# Patient Record
Sex: Female | Born: 1935 | Race: White | Hispanic: No | Smoking: Former smoker
Health system: Southern US, Community
[De-identification: ages and names within clinical notes are randomized; demographics above are authoritative.]

## PROBLEM LIST (undated history)

## (undated) DIAGNOSIS — F329 Major depressive disorder, single episode, unspecified: Secondary | ICD-10-CM

## (undated) DIAGNOSIS — F32A Depression, unspecified: Secondary | ICD-10-CM

## (undated) DIAGNOSIS — F419 Anxiety disorder, unspecified: Secondary | ICD-10-CM

## (undated) DIAGNOSIS — E7800 Pure hypercholesterolemia, unspecified: Secondary | ICD-10-CM

## (undated) DIAGNOSIS — M25569 Pain in unspecified knee: Secondary | ICD-10-CM

## (undated) DIAGNOSIS — C55 Malignant neoplasm of uterus, part unspecified: Secondary | ICD-10-CM

## (undated) DIAGNOSIS — I10 Essential (primary) hypertension: Secondary | ICD-10-CM

## (undated) HISTORY — PX: NOSE SURGERY: SHX723

## (undated) HISTORY — PX: BREAST SURGERY: SHX581

## (undated) HISTORY — PX: MIDDLE EAR SURGERY: SHX713

## (undated) HISTORY — PX: ABDOMINAL HYSTERECTOMY: SHX81

---

## 2003-12-03 ENCOUNTER — Ambulatory Visit: Payer: Self-pay | Admitting: General Surgery

## 2004-09-03 ENCOUNTER — Ambulatory Visit: Payer: Self-pay | Admitting: Internal Medicine

## 2004-12-04 ENCOUNTER — Ambulatory Visit: Payer: Self-pay | Admitting: General Surgery

## 2005-04-14 ENCOUNTER — Other Ambulatory Visit: Payer: Self-pay

## 2005-04-15 ENCOUNTER — Inpatient Hospital Stay: Payer: Self-pay | Admitting: Surgery

## 2005-05-21 ENCOUNTER — Ambulatory Visit: Payer: Self-pay | Admitting: Gastroenterology

## 2005-05-28 ENCOUNTER — Ambulatory Visit: Payer: Self-pay | Admitting: Unknown Physician Specialty

## 2005-07-01 ENCOUNTER — Ambulatory Visit: Payer: Self-pay | Admitting: Surgery

## 2005-07-03 ENCOUNTER — Ambulatory Visit: Payer: Self-pay | Admitting: Surgery

## 2005-09-03 ENCOUNTER — Emergency Department: Payer: Self-pay | Admitting: Internal Medicine

## 2005-11-30 ENCOUNTER — Emergency Department (HOSPITAL_COMMUNITY): Admission: EM | Admit: 2005-11-30 | Discharge: 2005-11-30 | Payer: Self-pay | Admitting: Family Medicine

## 2005-12-24 ENCOUNTER — Ambulatory Visit: Payer: Self-pay | Admitting: General Surgery

## 2007-01-04 ENCOUNTER — Ambulatory Visit: Payer: Self-pay | Admitting: General Surgery

## 2008-03-01 ENCOUNTER — Ambulatory Visit: Payer: Self-pay | Admitting: General Surgery

## 2011-09-30 DIAGNOSIS — I358 Other nonrheumatic aortic valve disorders: Secondary | ICD-10-CM | POA: Insufficient documentation

## 2011-09-30 DIAGNOSIS — I340 Nonrheumatic mitral (valve) insufficiency: Secondary | ICD-10-CM | POA: Insufficient documentation

## 2011-10-07 ENCOUNTER — Ambulatory Visit: Payer: Self-pay | Admitting: Ophthalmology

## 2011-10-22 DIAGNOSIS — F419 Anxiety disorder, unspecified: Secondary | ICD-10-CM | POA: Insufficient documentation

## 2011-10-28 ENCOUNTER — Ambulatory Visit: Payer: Self-pay | Admitting: Ophthalmology

## 2013-02-02 DIAGNOSIS — K52832 Lymphocytic colitis: Secondary | ICD-10-CM | POA: Insufficient documentation

## 2013-02-03 DIAGNOSIS — Z803 Family history of malignant neoplasm of breast: Secondary | ICD-10-CM | POA: Insufficient documentation

## 2013-02-03 DIAGNOSIS — Z8543 Personal history of malignant neoplasm of ovary: Secondary | ICD-10-CM | POA: Insufficient documentation

## 2013-09-07 ENCOUNTER — Ambulatory Visit: Payer: Self-pay | Admitting: Internal Medicine

## 2014-10-08 ENCOUNTER — Encounter: Payer: Self-pay | Admitting: Emergency Medicine

## 2014-10-08 ENCOUNTER — Ambulatory Visit
Admission: EM | Admit: 2014-10-08 | Discharge: 2014-10-08 | Disposition: A | Discharge status: Home / Self Care | Payer: Medicare Other | Attending: Family Medicine | Admitting: Family Medicine

## 2014-10-08 DIAGNOSIS — M25561 Pain in right knee: Secondary | ICD-10-CM

## 2014-10-08 HISTORY — DX: Malignant neoplasm of uterus, part unspecified: C55

## 2014-10-08 HISTORY — DX: Depression, unspecified: F32.A

## 2014-10-08 HISTORY — DX: Major depressive disorder, single episode, unspecified: F32.9

## 2014-10-08 HISTORY — DX: Pure hypercholesterolemia, unspecified: E78.00

## 2014-10-08 HISTORY — DX: Pain in unspecified knee: M25.569

## 2014-10-08 HISTORY — DX: Anxiety disorder, unspecified: F41.9

## 2014-10-08 NOTE — ED Provider Notes (Signed)
CSN: [id number]     Arrival date & time 10/08/14  1520 History   First MD Initiated Contact with Patient 10/08/14 1618     Chief Complaint  Patient presents with  . Knee Pain   (Consider location/radiation/quality/duration/timing/severity/associated sxs/prior Treatment) HPI  81 yo F from [facility], presents in a wheelchair complaining of right knee pain and requesting a cortisone injection.  It was explained that we did not have a provider who performed that particular procedure but we would be glad to  evaluate her knee and make recommendations, as well as treat her to help with her pain. She was very irritated that  She had been registered, "and they took my  $59" if we were unable to give her what she wanted.    Chelsea Hart was told  that front office does not confirm any procedures but rather advises potential patients that the medical personnel on duty will evaluate a given condition and diagnosis /therapy will be determined by the medical personnel. Chelsea. Hart was insistent that when she "made the appointment" she had made it clear what her requirements were for the visit. Reminded that we are a walk-in clinic without appointments,to which point she remembered that her conversation might have been with [facility]- They could not schedule her until August 19 which she felt was too long with her pain.( previous relationship with Dr Eliberto Ivory reported.) She again insisted that she be given a cortisone shot in the knee, and was reminded that that particular service was not available. She stated she had had right knee pain for a very long time and "other doctors had done it for her". She states that she drove into town recently and    that had  required 4 hours of sitting still and pushing the gas pedal. She had also helped her son paint  his entire living room which also stressed her knee. Past Medical History  Diagnosis Date  . Knee pain   . Anxiety   . Depression   . Hypercholesterolemia    . Uterine cancer    Past Surgical History  Procedure Laterality Date  . Abdominal hysterectomy    . Nose surgery    . Breast surgery    . Middle ear surgery     History reviewed. No pertinent family history. Social History  Substance Use Topics  . Smoking status: Current Every Day Smoker  . Smokeless tobacco: None  . Alcohol Use: No   OB History    No data available     Review of Systems Chelsea Hart complained of Right knee pain  Allergies  Penicillins  Home Medications   Prior to Admission medications   Medication Sig Start Date End Date Taking? Authorizing Provider  ALPRAZolam (XANAX XR) 0.5 MG 24 hr tablet Take 0.5 mg by mouth daily.   Yes Historical Provider, MD  amLODipine (NORVASC) 5 MG tablet Take 5 mg by mouth daily.   Yes Historical Provider, MD  PARoxetine HCl (PAXIL PO) Take by mouth.   Yes Historical Provider, MD  SIMVASTATIN PO Take by mouth.   Yes Historical Provider, MD   BP 123/61 mmHg  Pulse 92  Temp(Src) 98.9 F (37.2 C) (Oral)  Resp 18  SpO2 99% Physical Exam  I offered to evaluate her knee to see if there was anything we could do- She refused to be examined. She was alert and oriented, though irritated.  Seated in a wheelchair with right leg extended and partially covered under  a wrap Respirations were quiet , unlabored and without distress. Her VS were reported as normal Her right knee appeared larger than her left and without erythema.    ED Course  Procedures (including critical care time) Labs Review Labs Reviewed - No data to display  Imaging Review No results found.   Patient would not permit  an exam or evaluation so no medications could be issued to make her more comfortable She describes the pain as long standing, with increased episodes after stressing the joint. ( driving and painting) She apparently has medications at home for pain as well as wraps and crutches.  Dr. Zenda Alpers was apprised of the exam issues and consulted during  the course.  MDM   1. Right knee pain    Chelsea Hart son had dropped her off and left- so locating him and getting him back to UC required front office and nursing coordination of information. In that interim I contacted the local Urgent Cares and walk ins to see if anyone was Open and whether or not cortisone joint injections were performed.  The answers were universally NO except for the [place] office located at [address] and open until 7 pm. All the available information was gathered and reviewed with her son when he arrived, along with phone numbers and contact staff. It was explained that she was distressed about her registration and informed that  her $65.oo cash fee had been returned to her. Questions were fielded, recommendations made. Chelsea Hart and her son were welcomed to return for care in the future  Jan Fireman, [place] 10/10/14 2301

## 2014-10-08 NOTE — ED Notes (Signed)
Pt reports R knee pain since Tuesday when she drove home from beach. Has had fluid on R knee before.

## 2014-10-10 ENCOUNTER — Encounter: Payer: Self-pay | Admitting: Physician Assistant

## 2015-10-22 DIAGNOSIS — H04123 Dry eye syndrome of bilateral lacrimal glands: Secondary | ICD-10-CM | POA: Insufficient documentation

## 2015-10-30 DIAGNOSIS — R011 Cardiac murmur, unspecified: Secondary | ICD-10-CM | POA: Insufficient documentation

## 2015-10-30 DIAGNOSIS — R0602 Shortness of breath: Secondary | ICD-10-CM | POA: Insufficient documentation

## 2015-10-30 DIAGNOSIS — E7800 Pure hypercholesterolemia, unspecified: Secondary | ICD-10-CM | POA: Insufficient documentation

## 2015-10-30 DIAGNOSIS — I10 Essential (primary) hypertension: Secondary | ICD-10-CM | POA: Diagnosis present

## 2015-10-30 DIAGNOSIS — I422 Other hypertrophic cardiomyopathy: Secondary | ICD-10-CM | POA: Insufficient documentation

## 2015-12-12 DIAGNOSIS — J410 Simple chronic bronchitis: Secondary | ICD-10-CM | POA: Diagnosis present

## 2017-04-26 DIAGNOSIS — N183 Chronic kidney disease, stage 3 unspecified: Secondary | ICD-10-CM | POA: Insufficient documentation

## 2017-04-26 DIAGNOSIS — E042 Nontoxic multinodular goiter: Secondary | ICD-10-CM | POA: Insufficient documentation

## 2018-10-03 DIAGNOSIS — F17200 Nicotine dependence, unspecified, uncomplicated: Secondary | ICD-10-CM | POA: Diagnosis present

## 2019-06-06 DIAGNOSIS — Z1231 Encounter for screening mammogram for malignant neoplasm of breast: Secondary | ICD-10-CM | POA: Insufficient documentation

## 2019-06-06 DIAGNOSIS — Z79899 Other long term (current) drug therapy: Secondary | ICD-10-CM | POA: Insufficient documentation

## 2020-11-14 DIAGNOSIS — I2510 Atherosclerotic heart disease of native coronary artery without angina pectoris: Secondary | ICD-10-CM | POA: Diagnosis present

## 2021-03-27 ENCOUNTER — Emergency Department: Payer: Medicare Other

## 2021-03-27 ENCOUNTER — Encounter: Payer: Self-pay | Admitting: Internal Medicine

## 2021-03-27 ENCOUNTER — Other Ambulatory Visit: Payer: Self-pay

## 2021-03-27 ENCOUNTER — Inpatient Hospital Stay
Admission: EM | Admit: 2021-03-27 | Discharge: 2021-04-01 | DRG: 480 | Disposition: A | Discharge status: Skilled Nursing Facility | Payer: Medicare Other | Attending: Internal Medicine | Admitting: Internal Medicine

## 2021-03-27 DIAGNOSIS — Z79899 Other long term (current) drug therapy: Secondary | ICD-10-CM | POA: Diagnosis not present

## 2021-03-27 DIAGNOSIS — I82409 Acute embolism and thrombosis of unspecified deep veins of unspecified lower extremity: Secondary | ICD-10-CM

## 2021-03-27 DIAGNOSIS — Y92009 Unspecified place in unspecified non-institutional (private) residence as the place of occurrence of the external cause: Secondary | ICD-10-CM

## 2021-03-27 DIAGNOSIS — N183 Chronic kidney disease, stage 3 unspecified: Secondary | ICD-10-CM | POA: Diagnosis not present

## 2021-03-27 DIAGNOSIS — G459 Transient cerebral ischemic attack, unspecified: Secondary | ICD-10-CM | POA: Diagnosis not present

## 2021-03-27 DIAGNOSIS — Y9301 Activity, walking, marching and hiking: Secondary | ICD-10-CM | POA: Diagnosis present

## 2021-03-27 DIAGNOSIS — Z20822 Contact with and (suspected) exposure to covid-19: Secondary | ICD-10-CM | POA: Diagnosis present

## 2021-03-27 DIAGNOSIS — Z8542 Personal history of malignant neoplasm of other parts of uterus: Secondary | ICD-10-CM | POA: Diagnosis not present

## 2021-03-27 DIAGNOSIS — E7800 Pure hypercholesterolemia, unspecified: Secondary | ICD-10-CM | POA: Diagnosis present

## 2021-03-27 DIAGNOSIS — S80219A Abrasion, unspecified knee, initial encounter: Secondary | ICD-10-CM | POA: Diagnosis present

## 2021-03-27 DIAGNOSIS — I10 Essential (primary) hypertension: Secondary | ICD-10-CM | POA: Diagnosis present

## 2021-03-27 DIAGNOSIS — R29810 Facial weakness: Secondary | ICD-10-CM | POA: Diagnosis not present

## 2021-03-27 DIAGNOSIS — M1990 Unspecified osteoarthritis, unspecified site: Secondary | ICD-10-CM | POA: Diagnosis present

## 2021-03-27 DIAGNOSIS — I2699 Other pulmonary embolism without acute cor pulmonale: Secondary | ICD-10-CM

## 2021-03-27 DIAGNOSIS — S72002A Fracture of unspecified part of neck of left femur, initial encounter for closed fracture: Principal | ICD-10-CM | POA: Diagnosis present

## 2021-03-27 DIAGNOSIS — F32A Depression, unspecified: Secondary | ICD-10-CM | POA: Diagnosis present

## 2021-03-27 DIAGNOSIS — J410 Simple chronic bronchitis: Secondary | ICD-10-CM | POA: Diagnosis present

## 2021-03-27 DIAGNOSIS — I2693 Single subsegmental pulmonary embolism without acute cor pulmonale: Secondary | ICD-10-CM | POA: Diagnosis present

## 2021-03-27 DIAGNOSIS — F17210 Nicotine dependence, cigarettes, uncomplicated: Secondary | ICD-10-CM | POA: Diagnosis present

## 2021-03-27 DIAGNOSIS — Z880 Allergy status to penicillin: Secondary | ICD-10-CM | POA: Diagnosis not present

## 2021-03-27 DIAGNOSIS — N289 Disorder of kidney and ureter, unspecified: Secondary | ICD-10-CM

## 2021-03-27 DIAGNOSIS — Z419 Encounter for procedure for purposes other than remedying health state, unspecified: Secondary | ICD-10-CM

## 2021-03-27 DIAGNOSIS — Z90710 Acquired absence of both cervix and uterus: Secondary | ICD-10-CM

## 2021-03-27 DIAGNOSIS — M542 Cervicalgia: Secondary | ICD-10-CM | POA: Diagnosis present

## 2021-03-27 DIAGNOSIS — N179 Acute kidney failure, unspecified: Secondary | ICD-10-CM | POA: Diagnosis present

## 2021-03-27 DIAGNOSIS — F419 Anxiety disorder, unspecified: Secondary | ICD-10-CM | POA: Diagnosis present

## 2021-03-27 DIAGNOSIS — Z66 Do not resuscitate: Secondary | ICD-10-CM | POA: Diagnosis present

## 2021-03-27 DIAGNOSIS — I2583 Coronary atherosclerosis due to lipid rich plaque: Secondary | ICD-10-CM | POA: Diagnosis present

## 2021-03-27 DIAGNOSIS — G8929 Other chronic pain: Secondary | ICD-10-CM | POA: Diagnosis present

## 2021-03-27 DIAGNOSIS — N1831 Chronic kidney disease, stage 3a: Secondary | ICD-10-CM | POA: Diagnosis not present

## 2021-03-27 DIAGNOSIS — I671 Cerebral aneurysm, nonruptured: Secondary | ICD-10-CM | POA: Diagnosis present

## 2021-03-27 DIAGNOSIS — W010XXA Fall on same level from slipping, tripping and stumbling without subsequent striking against object, initial encounter: Secondary | ICD-10-CM | POA: Diagnosis present

## 2021-03-27 DIAGNOSIS — I2510 Atherosclerotic heart disease of native coronary artery without angina pectoris: Secondary | ICD-10-CM | POA: Diagnosis present

## 2021-03-27 DIAGNOSIS — M25552 Pain in left hip: Secondary | ICD-10-CM | POA: Diagnosis present

## 2021-03-27 DIAGNOSIS — F17200 Nicotine dependence, unspecified, uncomplicated: Secondary | ICD-10-CM | POA: Diagnosis present

## 2021-03-27 LAB — BASIC METABOLIC PANEL
Anion gap: 9 (ref 5–15)
BUN: 17 mg/dL (ref 8–23)
CO2: 25 mmol/L (ref 22–32)
Calcium: 10.1 mg/dL (ref 8.9–10.3)
Chloride: 103 mmol/L (ref 98–111)
Creatinine, Ser: 1.13 mg/dL — ABNORMAL HIGH (ref 0.44–1.00)
GFR, Estimated: 48 mL/min — ABNORMAL LOW (ref >60)
Glucose, Bld: 108 mg/dL — ABNORMAL HIGH (ref 70–99)
Potassium: 3.6 mmol/L (ref 3.5–5.1)
Sodium: 137 mmol/L (ref 135–145)

## 2021-03-27 LAB — CBC
HCT: 34.4 % — ABNORMAL LOW (ref 36.0–46.0)
Hemoglobin: 11.5 g/dL — ABNORMAL LOW (ref 12.0–15.0)
MCH: 31.8 pg (ref 26.0–34.0)
MCHC: 33.4 g/dL (ref 30.0–36.0)
MCV: 95 fL (ref 80.0–100.0)
Platelets: 278 10*3/uL (ref 150–400)
RBC: 3.62 MIL/uL — ABNORMAL LOW (ref 3.87–5.11)
RDW: 13.2 % (ref 11.5–15.5)
WBC: 9.6 10*3/uL (ref 4.0–10.5)
nRBC: 0 % (ref 0.0–0.2)

## 2021-03-27 LAB — RESP PANEL BY RT-PCR (FLU A&B, COVID) ARPGX2
Influenza A by PCR: NEGATIVE
Influenza B by PCR: NEGATIVE
SARS Coronavirus 2 by RT PCR: NEGATIVE

## 2021-03-27 LAB — TROPONIN I (HIGH SENSITIVITY)
Troponin I (High Sensitivity): 44 ng/L — ABNORMAL HIGH (ref <18)
Troponin I (High Sensitivity): 39 ng/L — ABNORMAL HIGH (ref <18)

## 2021-03-27 LAB — CK: Total CK: 106 U/L (ref 38–234)

## 2021-03-27 MED ORDER — FENTANYL CITRATE PF 50 MCG/ML IJ SOSY
50.0000 ug | PREFILLED_SYRINGE | Freq: Once | INTRAMUSCULAR | Status: AC
  Administered 2021-03-27: 50 ug via INTRAVENOUS

## 2021-03-27 MED ORDER — DOCUSATE SODIUM 100 MG PO CAPS
100.0000 mg | ORAL_CAPSULE | Freq: Two times a day (BID) | ORAL | Status: DC
  Administered 2021-03-28 – 2021-04-01 (×8): 100 mg via ORAL
  Filled 2021-03-27 (×9): qty 1

## 2021-03-27 MED ORDER — MORPHINE SULFATE (PF) 2 MG/ML IV SOLN
0.5000 mg | INTRAVENOUS | Status: DC | PRN
  Administered 2021-03-28: 0.5 mg via INTRAVENOUS
  Filled 2021-03-27: qty 1

## 2021-03-27 MED ORDER — LABETALOL HCL 5 MG/ML IV SOLN
10.0000 mg | Freq: Once | INTRAVENOUS | Status: AC
  Administered 2021-03-27: 10 mg via INTRAVENOUS
  Filled 2021-03-27: qty 4

## 2021-03-27 MED ORDER — FENTANYL CITRATE PF 50 MCG/ML IJ SOSY
50.0000 ug | PREFILLED_SYRINGE | INTRAMUSCULAR | Status: DC | PRN
  Administered 2021-03-28: 50 ug via INTRAVENOUS
  Filled 2021-03-27 (×2): qty 1

## 2021-03-27 MED ORDER — CEFAZOLIN SODIUM-DEXTROSE 1-4 GM/50ML-% IV SOLN
1.0000 g | Freq: Once | INTRAVENOUS | Status: DC
  Filled 2021-03-27: qty 50

## 2021-03-27 MED ORDER — PAROXETINE HCL 20 MG PO TABS
40.0000 mg | ORAL_TABLET | Freq: Every day | ORAL | Status: DC
  Administered 2021-03-29 – 2021-04-01 (×4): 40 mg via ORAL
  Filled 2021-03-27 (×5): qty 2

## 2021-03-27 MED ORDER — METOPROLOL TARTRATE 25 MG PO TABS
25.0000 mg | ORAL_TABLET | Freq: Two times a day (BID) | ORAL | Status: DC
  Administered 2021-03-27 – 2021-04-01 (×10): 25 mg via ORAL
  Filled 2021-03-27 (×11): qty 1

## 2021-03-27 MED ORDER — LATANOPROST 0.005 % OP SOLN
1.0000 [drp] | Freq: Every day | OPHTHALMIC | Status: DC
  Administered 2021-03-27 – 2021-03-31 (×5): 1 [drp] via OPHTHALMIC
  Filled 2021-03-27: qty 2.5

## 2021-03-27 MED ORDER — HYDROCODONE-ACETAMINOPHEN 5-325 MG PO TABS
1.0000 | ORAL_TABLET | Freq: Four times a day (QID) | ORAL | Status: DC | PRN
  Administered 2021-03-27: 2 via ORAL
  Administered 2021-03-28: 1 via ORAL
  Administered 2021-03-29 (×2): 2 via ORAL
  Administered 2021-03-29 – 2021-03-31 (×4): 1 via ORAL
  Administered 2021-04-01: 2 via ORAL
  Filled 2021-03-27: qty 1
  Filled 2021-03-27: qty 2
  Filled 2021-03-27 (×2): qty 1
  Filled 2021-03-27 (×4): qty 2
  Filled 2021-03-27 (×2): qty 1

## 2021-03-27 MED ORDER — METHOCARBAMOL 500 MG PO TABS
500.0000 mg | ORAL_TABLET | Freq: Four times a day (QID) | ORAL | Status: DC | PRN
  Administered 2021-03-28 – 2021-03-31 (×6): 500 mg via ORAL
  Filled 2021-03-27 (×6): qty 1

## 2021-03-27 MED ORDER — FENOFIBRATE 54 MG PO TABS
54.0000 mg | ORAL_TABLET | Freq: Every day | ORAL | Status: DC
  Administered 2021-03-29 – 2021-04-01 (×4): 54 mg via ORAL
  Filled 2021-03-27 (×5): qty 1

## 2021-03-27 MED ORDER — ONDANSETRON HCL 4 MG/2ML IJ SOLN
4.0000 mg | Freq: Once | INTRAMUSCULAR | Status: AC
  Administered 2021-03-27: 4 mg via INTRAVENOUS
  Filled 2021-03-27: qty 2

## 2021-03-27 MED ORDER — SIMVASTATIN 20 MG PO TABS
20.0000 mg | ORAL_TABLET | Freq: Every day | ORAL | Status: DC
  Administered 2021-03-27 – 2021-03-31 (×5): 20 mg via ORAL
  Filled 2021-03-27 (×5): qty 1

## 2021-03-27 MED ORDER — METHOCARBAMOL 1000 MG/10ML IJ SOLN
500.0000 mg | Freq: Four times a day (QID) | INTRAVENOUS | Status: DC | PRN
  Filled 2021-03-27: qty 5

## 2021-03-27 MED ORDER — ASPIRIN EC 81 MG PO TBEC
81.0000 mg | DELAYED_RELEASE_TABLET | Freq: Two times a day (BID) | ORAL | Status: DC
  Administered 2021-03-27 – 2021-04-01 (×9): 81 mg via ORAL
  Filled 2021-03-27 (×10): qty 1

## 2021-03-27 NOTE — Assessment & Plan Note (Signed)
Admit to MedSurg.  N.p.o. after midnight.  Orthopedics has been consulted.  As needed oxycodone for pain.  As needed IV Dilaudid for breakthrough pain.

## 2021-03-27 NOTE — ED Notes (Signed)
Patient transported to CT 

## 2021-03-27 NOTE — ED Notes (Signed)
Returned Call so son Percell Miller and updated on patient status and plan of care. Son stated that patient fell in the bathroom. Pt was able to be moved from floor to her bed.

## 2021-03-27 NOTE — Assessment & Plan Note (Signed)
Stable.  Patient on Lopressor 25 mg twice daily.

## 2021-03-27 NOTE — Assessment & Plan Note (Addendum)
Chronic.  Baseline creatinine is

## 2021-03-27 NOTE — Assessment & Plan Note (Signed)
Verified DNR status with the patient.  She states that her daughter Chelsea Hart is aware of her desire to be a DNR.

## 2021-03-27 NOTE — ED Notes (Signed)
Informed RN bed assigned 

## 2021-03-27 NOTE — ED Triage Notes (Signed)
Pt here via [facility] with a fall and weakness x3 months. Pt fell last night, skin tear to right knee. Pt denies pain. Pt in NAD in triage.   180/88 70 97% RA  109-cbg

## 2021-03-27 NOTE — ACP (Advance Care Planning) (Signed)
°  Advance Care Planning  Reason for Advance Care Planning Conversation: Acute hospitalization Principal Problem:   Closed left hip fracture (HCC) Active Problems:   CKD (chronic kidney disease) stage 3, GFR 30-59 ml/min (HCC)   Essential hypertension   Coronary artery disease due to lipid rich plaque   Simple chronic bronchitis ([place])   Smoker   DNR (do not resuscitate)/DNI(Do Not Intubate)    I discussed with patient about advance care planning. Specifically, we discussed whether patient would desire cardiopulmonary resuscitation (CPR) in the event of acute cardiopulmonary arrest. We also discussed whether endotracheal intubation and temporary ventilator life support would be desired in the event of acute cardio- or pulmonary decompensation.   Code status order of DNR has been entered in accord with the patient's wishes. no intubation  Living will: no  Health Care Agent / Davonna Belling              Name: hugh Loe: Relationship to Patient: son   Is agent appointed in legal document no  Time spent today in ACP discussion was  5 mins  Kristopher Oppenheim, DO [facility]

## 2021-03-27 NOTE — ED Provider Notes (Signed)
[facility] Provider Note    Event Date/Time   First MD Initiated Contact with Patient 03/27/21 1514     (approximate)   History   Weakness and Fall   HPI  Chelsea Hart is a 88 y.o. female with a history of anxiety and depression presents to the ER for evaluation evaluation of left hip and knee pain after fall yesterday morning.  She denies taking any blood thinners.  States that she fell like her left leg gave out with weakness.  Denies any chest pain or shortness of breath.  No nausea or vomiting.  No headache.  Does have chronic neck pain.     Physical Exam   Triage Vital Signs: ED Triage Vitals  Enc Vitals Group     BP 03/27/21 1505 (!) 190/87     Pulse Rate 03/27/21 1505 72     Resp 03/27/21 1505 16     Temp 03/27/21 1505 98.6 F (37 C)     Temp Source 03/27/21 1505 Oral     SpO2 03/27/21 1505 97 %     Weight 03/27/21 1508 155 lb (70.3 kg)     Height 03/27/21 1508 5\' 3"  (1.6 m)     Head Circumference --      Peak Flow --      Pain Score 03/27/21 1507 8     Pain Loc --      Pain Edu? --      Excl. in [place]? --     Most recent vital signs: Vitals:   03/27/21 1505 03/27/21 1530  BP: (!) 190/87 (!) 180/76  Pulse: 72 62  Resp: 16 18  Temp: 98.6 F (37 C)   SpO2: 97% 97%     Constitutional: Alert  Eyes: Conjunctivae are normal.  Head: Atraumatic. Nose: No congestion/rhinnorhea. Mouth/Throat: Mucous membranes are moist.   Neck: Painless ROM.  Cardiovascular:   Good peripheral circulation. Respiratory: Normal respiratory effort.  No retractions.  Gastrointestinal: Soft and nontender.  Musculoskeletal: Pain with logroll of left hip and with palpation of the left knee. No clear deformity Neurologic:  MAE spontaneously. No gross focal neurologic deficits are appreciated.  Skin:  Skin is warm, dry and intact. No rash noted. Psychiatric: Mood and affect are normal. Speech and behavior are normal.    ED Results / Procedures /  Treatments   Labs (all labs ordered are listed, but only abnormal results are displayed) Labs Reviewed  BASIC METABOLIC PANEL - Abnormal; Notable for the following components:      Result Value   Glucose, Bld 108 (*)    Creatinine, Ser 1.13 (*)    GFR, Estimated 48 (*)    All other components within normal limits  CBC - Abnormal; Notable for the following components:   RBC 3.62 (*)    Hemoglobin 11.5 (*)    HCT 34.4 (*)    All other components within normal limits  TROPONIN I (HIGH SENSITIVITY) - Abnormal; Notable for the following components:   Troponin I (High Sensitivity) 44 (*)    All other components within normal limits  CK  URINALYSIS, ROUTINE W REFLEX MICROSCOPIC  CBG MONITORING, ED  TROPONIN I (HIGH SENSITIVITY)     EKG  ED ECG REPORT I, Merlyn Lot, the attending physician, personally viewed and interpreted this ECG.   Date: 03/27/2021  EKG Time: 15:08  Rate: 65  Rhythm: sinus  Axis: normal  Intervals:normal qt  ST&T Change: poor r wave progression, no stemi  RADIOLOGY Please see ED Course for my review and interpretation.  I personally reviewed all radiographic images ordered to evaluate for the above acute complaints and reviewed radiology reports and findings.  These findings were personally discussed with the patient.  Please see medical record for radiology report.    PROCEDURES:  Critical Care performed:   Procedures   MEDICATIONS ORDERED IN ED: Medications  fentaNYL (SUBLIMAZE) injection 50 mcg (has no administration in time range)  ceFAZolin (ANCEF) IVPB 1 g/50 mL premix (has no administration in time range)     IMPRESSION / MDM / ASSESSMENT AND PLAN / ED COURSE  I reviewed the triage vital signs and the nursing notes.                              Differential diagnosis includes, but is not limited to, fracture, contusion, dislocation, electrolyte abnormality, anemia, rhabdo, ACS, dysrhythmia, subdural, IPH,  concussion  Patient presenting with symptoms as described above with left leg and hip pain as described above.  X-rays ordered for the but differential will provide IV pain medication.  EKG does not show any evidence of dysrhythmia or preexcitation syndrome and she is denying any chest pain at this time her abdominal exam is soft benign.  Blood will be sent for the blood differential.   Clinical Course as of 03/27/21 1711  Thu Mar 27, 2021  1623 Chest x-ray per my review does not show any evidence of pneumothorax. [PR]  2542 CT head by my review does not show any evidence of large hemorrhage or subdural hematoma.[place] of left hip by my review does not show dislocation per radiology report does show evidence of impacted femoral neck fracture.  Patient will require hospitalization.  I have ordered IV fentanyl for pain control. [PR]  7062 Patient's troponin is elevated but she is denying any chest pain at this time only complaining of left hip pain.  I will consult with Dr. Rudene Christians of orthopedics. [PR]  3762   Patient will require admission.  Discussed with hospitalist who have accepted to their service. [PR]    Clinical Course User Index [PR] Merlyn Lot, MD     FINAL CLINICAL IMPRESSION(S) / ED DIAGNOSES   Final diagnoses:  Closed fracture of neck of left femur, initial encounter [facility])     Rx / DC Orders   ED Discharge Orders     None        Note:  This document was prepared using Dragon voice recognition software and may include unintentional dictation errors.    Merlyn Lot, MD 03/27/21 [id number]

## 2021-03-27 NOTE — Consult Note (Signed)
Reason for Consult: Left hip pain fracture Referring Physician: Dr. Casey Burkitt is an 88 y.o. female.  HPI: Patient is an 88 year old who lives in [place] was drove up to visit her children.  She says when she got her car she suffered a fall and has an abrasion to the anterior knee and bruise to the lateral hip.  She has been having persisting pain and was brought to emergency room where she is found to have an impacted femoral neck fracture.  She is being admitted for treatment of this.  She reports she takes care of her self living alone able to drive and walks without assistive device  Past Medical History:  Diagnosis Date   Anxiety    Depression    Hypercholesterolemia    Knee pain    Uterine cancer ([place])     Past Surgical History:  Procedure Laterality Date   ABDOMINAL HYSTERECTOMY     BREAST SURGERY     MIDDLE EAR SURGERY     NOSE SURGERY      No family history on file.  Social History:  reports that she has been smoking. She does not have any smokeless tobacco history on file. She reports that she does not drink alcohol. No history on file for drug use.  Allergies:  Allergies  Allergen Reactions   Penicillins     Medications: I have reviewed the patient's current medications.  Results for orders placed or performed during the hospital encounter of 03/27/21 (from the past 48 hour(s))  Basic metabolic panel     Status: Abnormal   Collection Time: 03/27/21  3:10 PM  Result Value Ref Range   Sodium 137 135 - 145 mmol/L   Potassium 3.6 3.5 - 5.1 mmol/L   Chloride 103 98 - 111 mmol/L   CO2 25 22 - 32 mmol/L   Glucose, Bld 108 (H) 70 - 99 mg/dL    Comment: Glucose reference range applies only to samples taken after fasting for at least 8 hours.   BUN 17 8 - 23 mg/dL   Creatinine, Ser 1.13 (H) 0.44 - 1.00 mg/dL   Calcium 10.1 8.9 - 10.3 mg/dL   GFR, Estimated 48 (L) >60 mL/min    Comment: (NOTE) Calculated using the CKD-EPI Creatinine Equation  (2021)    Anion gap 9 5 - 15    Comment: Performed at [facility], [address] [place], [place] [address]  CBC     Status: Abnormal   Collection Time: 03/27/21  3:10 PM  Result Value Ref Range   WBC 9.6 4.0 - 10.5 K/uL   RBC 3.62 (L) 3.87 - 5.11 MIL/uL   Hemoglobin 11.5 (L) 12.0 - 15.0 g/dL   HCT 34.4 (L) 36.0 - 46.0 %   MCV 95.0 80.0 - 100.0 fL   MCH 31.8 26.0 - 34.0 pg   MCHC 33.4 30.0 - 36.0 g/dL   RDW 13.2 11.5 - 15.5 %   Platelets 278 150 - 400 K/uL   nRBC 0.0 0.0 - 0.2 %    Comment: Performed at [facility], [place],  [address]  Troponin I (High Sensitivity)     Status: Abnormal   Collection Time: 03/27/21  3:10 PM  Result Value Ref Range   Troponin I (High Sensitivity) 44 (H) <18 ng/L    Comment: (NOTE) Elevated high sensitivity troponin I (hsTnI) values and significant  changes across serial measurements may suggest ACS but many other  chronic  and acute conditions are known to elevate hsTnI results.  Refer to the "Links" section for chest pain algorithms and additional  guidance. Performed at [facility], [address] [place], [place] [address]   CK     Status: None   Collection Time: 03/27/21  3:10 PM  Result Value Ref Range   Total CK 106 38 - 234 U/L    Comment: Performed at [facility], [address] [place], [place] [address]    DG Chest 1 View  Result Date: 03/27/2021 CLINICAL DATA:  Weakness and fall.  Question infiltrate. EXAM: CHEST  1 VIEW COMPARISON:  04/14/2005 FINDINGS: Artifact overlies the chest. Heart size is normal. Mild aortic atherosclerotic tortuosity and calcification. The right chest is clear and normal. There is mild elevation of the left hemidiaphragm, with mild crowding of the markings at the lung base because of that, but the lung appears otherwise clear. No effusion. IMPRESSION: No acute or traumatic finding. Mild elevation of the left hemidiaphragm with mild crowding  of the markings at the left base because of that. Electronically Signed   By: Nelson Chimes M.D.   On: 03/27/2021 16:13   CT HEAD WO CONTRAST (5MM)  Result Date: 03/27/2021 CLINICAL DATA:  Head trauma, minor (Age >= 65y) EXAM: CT HEAD WITHOUT CONTRAST TECHNIQUE: Contiguous axial images were obtained from the base of the skull through the vertex without intravenous contrast. RADIATION DOSE REDUCTION: This exam was performed according to the departmental dose-optimization program which includes automated exposure control, adjustment of the mA and/or kV according to patient size and/or use of iterative reconstruction technique. COMPARISON:  None. FINDINGS: Brain: No evidence of acute infarction, hemorrhage, hydrocephalus, extra-axial collection or mass lesion/mass effect. Focal area of encephalomalacia in the right frontal lobe compatible with remote ischemia. Multiple prior lacunar infarcts in the bilateral basal ganglia. Extensive low-density changes within the periventricular and subcortical white matter compatible with chronic microvascular ischemic change. Mild diffuse cerebral volume loss. Vascular: Atherosclerotic calcifications involving the large vessels of the skull base. No unexpected hyperdense vessel. Skull: Normal. Negative for fracture or focal lesion. Sinuses/Orbits: No acute finding. Other: None. IMPRESSION: 1. No acute intracranial abnormality. 2. Extensive chronic microvascular ischemic changes and cerebral volume loss. Electronically Signed   By: Davina Poke D.O.   On: 03/27/2021 16:40   CT Cervical Spine Wo Contrast  Result Date: 03/27/2021 CLINICAL DATA:  Neck trauma EXAM: CT CERVICAL SPINE WITHOUT CONTRAST TECHNIQUE: Multidetector CT imaging of the cervical spine was performed without intravenous contrast. Multiplanar CT image reconstructions were also generated. RADIATION DOSE REDUCTION: This exam was performed according to the departmental dose-optimization program which includes  automated exposure control, adjustment of the mA and/or kV according to patient size and/or use of iterative reconstruction technique. COMPARISON:  None. FINDINGS: Alignment: Grade 1 anterolisthesis of C3 on C4 and C7 on T1. No evidence of acute subluxation. Skull base and vertebrae: No acute fracture. No primary bone lesion or focal pathologic process. Soft tissues and spinal canal: No prevertebral fluid or swelling. No visible canal hematoma. Disc levels: Moderate to severe intervertebral disc space narrowing from C4 through C7. Associated endplate sclerosis and dorsal endplate osteophytes most prominent at C5-C6. Bilateral facet arthropathy. Mild neural foraminal narrowing at C5-C6 on the right. Upper chest: Heterogeneous thyroid gland with likely nodules on the right measuring up to 1.5 cm. Mild biapical pleural thickening. Other: None. IMPRESSION: 1. No acute fracture or subluxation identified. Degenerative changes of the cervical spine. 2. Heterogeneous nodular  thyroid gland, consider follow-up ultrasound. Electronically Signed   By: Ofilia Neas M.D.   On: 03/27/2021 16:37   DG Knee Complete 4 Views Left  Result Date: 03/27/2021 CLINICAL DATA:  [place] yesterday.  Knee pain. EXAM: LEFT KNEE - COMPLETE 4+ VIEW COMPARISON:  None. FINDINGS: No joint effusion. No fracture. Chronic appearing joint space narrowing with chondrocalcinosis, typical of age. No focal lesion. IMPRESSION: No acute or traumatic finding. Joint space narrowing and chondrocalcinosis. Electronically Signed   By: Nelson Chimes M.D.   On: 03/27/2021 16:11   DG Hip Unilat W or Wo Pelvis 2-3 Views Left  Result Date: 03/27/2021 CLINICAL DATA:  [place] yesterday with left hip pain. EXAM: DG HIP (WITH OR WITHOUT PELVIS) 2-3V LEFT COMPARISON:  None. FINDINGS: There is a fracture of the left femoral neck with slight impaction. No intertrochanteric component. The bones of the pelvis are negative. IMPRESSION: Left femoral neck fracture with minimal  impaction. Electronically Signed   By: Nelson Chimes M.D.   On: 03/27/2021 16:10    Review of Systems Blood pressure (!) 180/76, pulse 62, temperature 98.6 F (37 C), temperature source Oral, resp. rate 18, height 5\' 3"  (1.6 m), weight 70.3 kg, SpO2 97 %. Physical Exam There is approximately 2 and half centimeter abrasion over the anterior knee and approximately same size bruise to the lateral aspect of the hip.  There is no pain with logrolling and there is no shortening or external rotation of the leg.  She does have palpable pulses Assessment/Plan: Impacted femoral neck fracture an active 88 year old Recommendation is for hip pinning and weightbearing as tolerated following this.  Risk of avascular necrosis and nonunion were discussed as well as blood clot.  She has no history of DVT PE  Hessie Knows 03/27/2021, 5:10 PM

## 2021-03-27 NOTE — Assessment & Plan Note (Signed)
Patient still smoking

## 2021-03-27 NOTE — ED Notes (Signed)
Pt stated that she fell last night on left side while getting in her car. Pt denies taking any blood thinners. C/o of pain when moving left leg of 9 on a 0-10 pain scale but when extremity is still pt doesn't not c/o of pain.

## 2021-03-27 NOTE — Assessment & Plan Note (Signed)
Chronic. 

## 2021-03-27 NOTE — Subjective & Objective (Signed)
CC: fall, left hip pain HPI: 88 year old female who is from [facility] who is a here visiting her son.  She also dropped off a friend in [place].  Patient still driving.  She lives independently.  She states that she fell at her son's house yesterday morning.  She was able to get back up.  She states that her left leg gave out from her when she was walking.  She has been able to walk around on it yesterday.  When she try to get up this morning she fell again.  Her son urged her to come to the ER.  ER evaluation demonstrated a left femoral neck fracture with slight impaction.  Orthopedics has been consulted.  Patient to go to the OR tomorrow.  [facility] contacted for admission.  Patient complains of some neck pain.  CT scan of the neck was negative for any fractures.

## 2021-03-27 NOTE — H&P (Signed)
History and Physical    Chelsea Hart KDT:[id number] DOB: January 21, 1934 DOA: 03/27/2021  PCP: Pcp, No   Patient coming from: Home  I have personally briefly reviewed patient's old medical records in [place]  CC: fall, left hip pain HPI: 88 year old female who is from [facility] who is a here visiting her son.  She also dropped off a friend in [place].  Patient still driving.  She lives independently.  She states that she fell at her son's house yesterday morning.  She was able to get back up.  She states that her left leg gave out from her when she was walking.  She has been able to walk around on it yesterday.  When she try to get up this morning she fell again.  Her son urged her to come to the ER.  ER evaluation demonstrated a left femoral neck fracture with slight impaction.  Orthopedics has been consulted.  Patient to go to the OR tomorrow.  [facility] hospitalist contacted for admission.  Patient complains of some neck pain.  CT scan of the neck was negative for any fractures.   ED Course: Xrays show left hip fracture  Review of Systems:  Review of Systems  Constitutional: Negative.  Negative for chills, fever and weight loss.  HENT: Negative.  Negative for ear pain and hearing loss.   Eyes: Negative.  Negative for blurred vision, double vision and photophobia.  Respiratory: Negative.  Negative for cough, hemoptysis and sputum production.   Cardiovascular: Negative.  Negative for chest pain, palpitations and orthopnea.  Gastrointestinal: Negative.  Negative for heartburn, nausea and vomiting.  Genitourinary: Negative.  Negative for dysuria, frequency and urgency.  Musculoskeletal:  Positive for joint pain and neck pain.       Left hip pain  Skin: Negative.   Neurological: Negative.   Endo/Heme/Allergies: Negative.   Psychiatric/Behavioral: Negative.    All other systems reviewed and are negative.  Past Medical History:  Diagnosis Date   Anxiety     Depression    Hypercholesterolemia    Knee pain    Uterine cancer ([place])     Past Surgical History:  Procedure Laterality Date   ABDOMINAL HYSTERECTOMY     BREAST SURGERY     MIDDLE EAR SURGERY     NOSE SURGERY       reports that she has been smoking cigarettes. She has been smoking an average of 1 pack per day. She does not have any smokeless tobacco history on file. She reports that she does not drink alcohol and does not use drugs.  Allergies  Allergen Reactions   Penicillins     No family history on file.  Prior to Admission medications   Medication Sig Start Date End Date Taking? Authorizing Provider  fenofibrate 54 MG tablet Take 54 mg by mouth daily.   Yes [provider]  latanoprost (XALATAN) 0.005 % ophthalmic solution Place 1 drop into both eyes at bedtime.   Yes [provider]  metoprolol tartrate (LOPRESSOR) 25 MG tablet Take 25 mg by mouth 2 (two) times daily.   Yes [provider]  PARoxetine (PAXIL) 40 MG tablet Take 40 mg by mouth daily.   Yes [provider]  simvastatin (ZOCOR) 20 MG tablet Take 20 mg by mouth at bedtime.   Yes [provider]    Physical Exam: Vitals:   03/27/21 1505 03/27/21 1508 03/27/21 1530  BP: (!) 190/87  (!) 180/76  Pulse: 72  62  Resp: 16  18  Temp: 98.6 F (37 C)    TempSrc: Oral    SpO2: 97%  97%  Weight:  70.3 kg   Height:  5\' 3"  (1.6 m)     Physical Exam Vitals reviewed.  Constitutional:      General: She is not in acute distress.    Appearance: Normal appearance. She is not ill-appearing, toxic-appearing or diaphoretic.  HENT:     Head: Normocephalic and atraumatic.     Nose: Nose normal. No rhinorrhea.  Eyes:     General: No scleral icterus. Cardiovascular:     Rate and Rhythm: Normal rate and regular rhythm.     Pulses: Normal pulses.          Dorsalis pedis pulses are 2+ on the right side and 2+ on the left side.  Pulmonary:     Effort: Pulmonary effort is  normal. No respiratory distress.     Breath sounds: No wheezing or rales.  Abdominal:     General: Bowel sounds are normal. There is no distension.     Tenderness: There is no abdominal tenderness. There is no guarding or rebound.  Musculoskeletal:     Right lower leg: No edema.     Left lower leg: No edema.  Skin:    General: Skin is warm and dry.  Neurological:     General: No focal deficit present.     Mental Status: She is alert and oriented to person, place, and time.     Labs on Admission: I have personally reviewed following labs and imaging studies  CBC: Recent Labs  Lab 03/27/21 1510  WBC 9.6  HGB 11.5*  HCT 34.4*  MCV 95.0  PLT 497   Basic Metabolic Panel: Recent Labs  Lab 03/27/21 1510  NA 137  K 3.6  CL 103  CO2 25  GLUCOSE 108*  BUN 17  CREATININE 1.13*  CALCIUM 10.1   GFR: Estimated Creatinine Clearance: 34.2 mL/min (A) (by C-G formula based on SCr of 1.13 mg/dL (H)). Liver Function Tests: No results for input(s): AST, ALT, ALKPHOS, BILITOT, PROT, ALBUMIN in the last 168 hours. No results for input(s): LIPASE, AMYLASE in the last 168 hours. No results for input(s): AMMONIA in the last 168 hours. Coagulation Profile: No results for input(s): INR, PROTIME in the last 168 hours. Cardiac Enzymes: Recent Labs  Lab 03/27/21 1510  CKTOTAL 106   BNP (last 3 results) No results for input(s): PROBNP in the last 8760 hours. HbA1C: No results for input(s): HGBA1C in the last 72 hours. CBG: No results for input(s): GLUCAP in the last 168 hours. Lipid Profile: No results for input(s): CHOL, HDL, LDLCALC, TRIG, CHOLHDL, LDLDIRECT in the last 72 hours. Thyroid Function Tests: No results for input(s): TSH, T4TOTAL, FREET4, T3FREE, THYROIDAB in the last 72 hours. Anemia Panel: No results for input(s): VITAMINB12, FOLATE, FERRITIN, TIBC, IRON, RETICCTPCT in the last 72 hours. Urine analysis: No results found for: COLORURINE, APPEARANCEUR, LABSPEC,  [place], GLUCOSEU, HGBUR, BILIRUBINUR, KETONESUR, PROTEINUR, UROBILINOGEN, NITRITE, LEUKOCYTESUR  Radiological Exams on Admission: I have personally reviewed images DG Chest 1 View  Result Date: 03/27/2021 CLINICAL DATA:  Weakness and fall.  Question infiltrate. EXAM: CHEST  1 VIEW COMPARISON:  04/14/2005 FINDINGS: Artifact overlies the chest. Heart size is normal. Mild aortic atherosclerotic tortuosity and calcification. The right chest is clear and normal. There is mild elevation of the left hemidiaphragm, with mild crowding of the markings at the lung base because of that, but  the lung appears otherwise clear. No effusion. IMPRESSION: No acute or traumatic finding. Mild elevation of the left hemidiaphragm with mild crowding of the markings at the left base because of that. Electronically Signed   By: Nelson Chimes M.D.   On: 03/27/2021 16:13   CT HEAD WO CONTRAST (5MM)  Result Date: 03/27/2021 CLINICAL DATA:  Head trauma, minor (Age >= 65y) EXAM: CT HEAD WITHOUT CONTRAST TECHNIQUE: Contiguous axial images were obtained from the base of the skull through the vertex without intravenous contrast. RADIATION DOSE REDUCTION: This exam was performed according to the departmental dose-optimization program which includes automated exposure control, adjustment of the mA and/or kV according to patient size and/or use of iterative reconstruction technique. COMPARISON:  None. FINDINGS: Brain: No evidence of acute infarction, hemorrhage, hydrocephalus, extra-axial collection or mass lesion/mass effect. Focal area of encephalomalacia in the right frontal lobe compatible with remote ischemia. Multiple prior lacunar infarcts in the bilateral basal ganglia. Extensive low-density changes within the periventricular and subcortical white matter compatible with chronic microvascular ischemic change. Mild diffuse cerebral volume loss. Vascular: Atherosclerotic calcifications involving the large vessels of the skull base. No  unexpected hyperdense vessel. Skull: Normal. Negative for fracture or focal lesion. Sinuses/Orbits: No acute finding. Other: None. IMPRESSION: 1. No acute intracranial abnormality. 2. Extensive chronic microvascular ischemic changes and cerebral volume loss. Electronically Signed   By: Davina Poke D.O.   On: 03/27/2021 16:40   CT Cervical Spine Wo Contrast  Result Date: 03/27/2021 CLINICAL DATA:  Neck trauma EXAM: CT CERVICAL SPINE WITHOUT CONTRAST TECHNIQUE: Multidetector CT imaging of the cervical spine was performed without intravenous contrast. Multiplanar CT image reconstructions were also generated. RADIATION DOSE REDUCTION: This exam was performed according to the departmental dose-optimization program which includes automated exposure control, adjustment of the mA and/or kV according to patient size and/or use of iterative reconstruction technique. COMPARISON:  None. FINDINGS: Alignment: Grade 1 anterolisthesis of C3 on C4 and C7 on T1. No evidence of acute subluxation. Skull base and vertebrae: No acute fracture. No primary bone lesion or focal pathologic process. Soft tissues and spinal canal: No prevertebral fluid or swelling. No visible canal hematoma. Disc levels: Moderate to severe intervertebral disc space narrowing from C4 through C7. Associated endplate sclerosis and dorsal endplate osteophytes most prominent at C5-C6. Bilateral facet arthropathy. Mild neural foraminal narrowing at C5-C6 on the right. Upper chest: Heterogeneous thyroid gland with likely nodules on the right measuring up to 1.5 cm. Mild biapical pleural thickening. Other: None. IMPRESSION: 1. No acute fracture or subluxation identified. Degenerative changes of the cervical spine. 2. Heterogeneous nodular thyroid gland, consider follow-up ultrasound. Electronically Signed   By: Ofilia Neas M.D.   On: 03/27/2021 16:37   DG Knee Complete 4 Views Left  Result Date: 03/27/2021 CLINICAL DATA:  [place] yesterday.  Knee pain.  EXAM: LEFT KNEE - COMPLETE 4+ VIEW COMPARISON:  None. FINDINGS: No joint effusion. No fracture. Chronic appearing joint space narrowing with chondrocalcinosis, typical of age. No focal lesion. IMPRESSION: No acute or traumatic finding. Joint space narrowing and chondrocalcinosis. Electronically Signed   By: Nelson Chimes M.D.   On: 03/27/2021 16:11   DG Hip Unilat W or Wo Pelvis 2-3 Views Left  Result Date: 03/27/2021 CLINICAL DATA:  [place] yesterday with left hip pain. EXAM: DG HIP (WITH OR WITHOUT PELVIS) 2-3V LEFT COMPARISON:  None. FINDINGS: There is a fracture of the left femoral neck with slight impaction. No intertrochanteric component. The bones of the pelvis are negative. IMPRESSION:  Left femoral neck fracture with minimal impaction. Electronically Signed   By: Nelson Chimes M.D.   On: 03/27/2021 16:10    EKG: I have personally reviewed EKG: NSR   Assessment/Plan Principal Problem:   Closed left hip fracture (HCC) Active Problems:   CKD (chronic kidney disease) stage 3, GFR 30-59 ml/min (HCC)   Essential hypertension   Coronary artery disease due to lipid rich plaque   Simple chronic bronchitis (HCC)   Smoker   DNR (do not resuscitate)/DNI(Do Not Intubate)    Closed left hip fracture ([place]) Admit to MedSurg.  N.p.o. after midnight.  Orthopedics has been consulted.  As needed oxycodone for pain.  As needed IV Dilaudid for breakthrough pain.  CKD (chronic kidney disease) stage 3, GFR 30-59 ml/min (HCC) Chronic.  Baseline creatinine is   Essential hypertension Stable.  Patient on Lopressor 25 mg twice daily.  Coronary artery disease due to lipid rich plaque Stable.  Is on fenofibrate at home.  On Lopressor.  On Zocor.  Simple chronic bronchitis (HCC) Chronic.  Smoker Patient still smoking.  DNR (do not resuscitate)/DNI(Do Not Intubate) Verified DNR status with the patient.  She states that her daughter Briant Cedar is aware of her desire to be a DNR.  DVT prophylaxis: SCDs ASA  81 mg bid Code Status: DNR/DNI(Do NOT Intubate) verified with pt. Pt understands that she will be made FULL CODE for surgery and changed back to DNR/DNI afterwards Family Communication: no family at bedside  Disposition Plan: return home  Consults called: orthopedis  Admission status: Inpatient, Med-Surg   Kristopher Oppenheim, DO [facility] 03/27/2021, 6:05 PM

## 2021-03-27 NOTE — Assessment & Plan Note (Signed)
Stable.  Is on fenofibrate at home.  On Lopressor.  On Zocor.

## 2021-03-28 ENCOUNTER — Encounter: Payer: Self-pay | Admitting: Internal Medicine

## 2021-03-28 ENCOUNTER — Inpatient Hospital Stay: Payer: Medicare Other

## 2021-03-28 ENCOUNTER — Other Ambulatory Visit: Payer: Self-pay

## 2021-03-28 ENCOUNTER — Encounter
Admission: EM | Disposition: A | Discharge status: Skilled Nursing Facility | Payer: Self-pay | Source: Home / Self Care | Attending: Internal Medicine

## 2021-03-28 ENCOUNTER — Inpatient Hospital Stay: Payer: Medicare Other | Admitting: Anesthesiology

## 2021-03-28 DIAGNOSIS — N1831 Chronic kidney disease, stage 3a: Secondary | ICD-10-CM

## 2021-03-28 HISTORY — PX: HIP PINNING,CANNULATED: SHX1758

## 2021-03-28 LAB — CBC WITH DIFFERENTIAL/PLATELET
Abs Immature Granulocytes: 0.05 10*3/uL (ref 0.00–0.07)
Basophils Absolute: 0.1 10*3/uL (ref 0.0–0.1)
Basophils Relative: 1 %
Eosinophils Absolute: 0.3 10*3/uL (ref 0.0–0.5)
Eosinophils Relative: 3 %
HCT: 32.1 % — ABNORMAL LOW (ref 36.0–46.0)
Hemoglobin: 10.5 g/dL — ABNORMAL LOW (ref 12.0–15.0)
Immature Granulocytes: 1 %
Lymphocytes Relative: 23 %
Lymphs Abs: 2.4 10*3/uL (ref 0.7–4.0)
MCH: 32.2 pg (ref 26.0–34.0)
MCHC: 32.7 g/dL (ref 30.0–36.0)
MCV: 98.5 fL (ref 80.0–100.0)
Monocytes Absolute: 1.2 10*3/uL — ABNORMAL HIGH (ref 0.1–1.0)
Monocytes Relative: 12 %
Neutro Abs: 6.4 10*3/uL (ref 1.7–7.7)
Neutrophils Relative %: 60 %
Platelets: 260 10*3/uL (ref 150–400)
RBC: 3.26 MIL/uL — ABNORMAL LOW (ref 3.87–5.11)
RDW: 13.2 % (ref 11.5–15.5)
WBC: 10.3 10*3/uL (ref 4.0–10.5)
nRBC: 0 % (ref 0.0–0.2)

## 2021-03-28 LAB — URINALYSIS, MICROSCOPIC (REFLEX)

## 2021-03-28 LAB — COMPREHENSIVE METABOLIC PANEL
ALT: 15 U/L (ref 0–44)
AST: 19 U/L (ref 15–41)
Albumin: 3.3 g/dL — ABNORMAL LOW (ref 3.5–5.0)
Alkaline Phosphatase: 64 U/L (ref 38–126)
Anion gap: 9 (ref 5–15)
BUN: 20 mg/dL (ref 8–23)
CO2: 24 mmol/L (ref 22–32)
Calcium: 9.4 mg/dL (ref 8.9–10.3)
Chloride: 104 mmol/L (ref 98–111)
Creatinine, Ser: 1.35 mg/dL — ABNORMAL HIGH (ref 0.44–1.00)
GFR, Estimated: 39 mL/min — ABNORMAL LOW (ref >60)
Glucose, Bld: 121 mg/dL — ABNORMAL HIGH (ref 70–99)
Potassium: 3.5 mmol/L (ref 3.5–5.1)
Sodium: 137 mmol/L (ref 135–145)
Total Bilirubin: 0.5 mg/dL (ref 0.3–1.2)
Total Protein: 6.5 g/dL (ref 6.5–8.1)

## 2021-03-28 LAB — SURGICAL PCR SCREEN
MRSA, PCR: NEGATIVE
Staphylococcus aureus: NEGATIVE

## 2021-03-28 LAB — URINALYSIS, ROUTINE W REFLEX MICROSCOPIC
Bilirubin Urine: NEGATIVE
Glucose, UA: NEGATIVE mg/dL
Ketones, ur: NEGATIVE mg/dL
Leukocytes,Ua: NEGATIVE
Nitrite: NEGATIVE
Protein, ur: 100 mg/dL — AB
Specific Gravity, Urine: 1.015 (ref 1.005–1.030)
pH: 7 (ref 5.0–8.0)

## 2021-03-28 SURGERY — FIXATION, FEMUR, NECK, PERCUTANEOUS, USING SCREW
Anesthesia: General | Site: Hip | Laterality: Left

## 2021-03-28 MED ORDER — ENSURE ENLIVE PO LIQD
237.0000 mL | Freq: Two times a day (BID) | ORAL | Status: DC
  Administered 2021-03-29 – 2021-04-01 (×8): 237 mL via ORAL

## 2021-03-28 MED ORDER — FENTANYL CITRATE (PF) 100 MCG/2ML IJ SOLN: 25.0000 ug | INTRAMUSCULAR | Status: DC | PRN

## 2021-03-28 MED ORDER — FENTANYL CITRATE (PF) 100 MCG/2ML IJ SOLN
INTRAMUSCULAR | Status: AC
  Administered 2021-03-28: 50 ug via INTRAVENOUS
  Filled 2021-03-28: qty 2

## 2021-03-28 MED ORDER — ONDANSETRON HCL 4 MG PO TABS: 4.0000 mg | ORAL_TABLET | Freq: Four times a day (QID) | ORAL | Status: DC | PRN

## 2021-03-28 MED ORDER — ONDANSETRON HCL 4 MG/2ML IJ SOLN: 4.0000 mg | Freq: Four times a day (QID) | INTRAMUSCULAR | Status: DC | PRN

## 2021-03-28 MED ORDER — DOCUSATE SODIUM 100 MG PO CAPS: 100.0000 mg | ORAL_CAPSULE | Freq: Two times a day (BID) | ORAL | Status: DC

## 2021-03-28 MED ORDER — BUPIVACAINE-EPINEPHRINE (PF) 0.5% -1:200000 IJ SOLN
INTRAMUSCULAR | Status: AC
  Filled 2021-03-28: qty 30

## 2021-03-28 MED ORDER — POTASSIUM CHLORIDE IN NACL 20-0.9 MEQ/L-% IV SOLN
INTRAVENOUS | Status: DC
  Filled 2021-03-28 (×5): qty 1000

## 2021-03-28 MED ORDER — OXYCODONE HCL 5 MG PO TABS: 5.0000 mg | ORAL_TABLET | Freq: Once | ORAL | Status: DC | PRN

## 2021-03-28 MED ORDER — ACETAMINOPHEN 325 MG PO TABS: 650.0000 mg | ORAL_TABLET | Freq: Once | ORAL | Status: DC | PRN

## 2021-03-28 MED ORDER — ENOXAPARIN SODIUM 40 MG/0.4ML IJ SOSY
40.0000 mg | PREFILLED_SYRINGE | INTRAMUSCULAR | Status: DC
  Administered 2021-03-29 – 2021-04-01 (×4): 40 mg via SUBCUTANEOUS
  Filled 2021-03-28 (×4): qty 0.4

## 2021-03-28 MED ORDER — PROPOFOL 500 MG/50ML IV EMUL
INTRAVENOUS | Status: DC | PRN
  Administered 2021-03-28 (×2): 50 ug/kg/min via INTRAVENOUS

## 2021-03-28 MED ORDER — CEFAZOLIN SODIUM-DEXTROSE 1-4 GM/50ML-% IV SOLN
1.0000 g | Freq: Three times a day (TID) | INTRAVENOUS | Status: AC
  Administered 2021-03-28 – 2021-03-29 (×2): 1 g via INTRAVENOUS
  Filled 2021-03-28 (×2): qty 50

## 2021-03-28 MED ORDER — ALUM & MAG HYDROXIDE-SIMETH 200-200-20 MG/5ML PO SUSP: 30.0000 mL | ORAL | Status: DC | PRN

## 2021-03-28 MED ORDER — ACETAMINOPHEN 10 MG/ML IV SOLN
1000.0000 mg | Freq: Once | INTRAVENOUS | Status: DC | PRN
  Administered 2021-03-28: 1000 mg via INTRAVENOUS

## 2021-03-28 MED ORDER — METOCLOPRAMIDE HCL 10 MG PO TABS: 5.0000 mg | ORAL_TABLET | Freq: Three times a day (TID) | ORAL | Status: DC | PRN

## 2021-03-28 MED ORDER — LORATADINE 10 MG PO TABS: 10.0000 mg | ORAL_TABLET | Freq: Every day | ORAL | Status: DC | PRN

## 2021-03-28 MED ORDER — ONDANSETRON HCL 4 MG/2ML IJ SOLN
INTRAMUSCULAR | Status: DC | PRN
  Administered 2021-03-28: 4 mg via INTRAVENOUS

## 2021-03-28 MED ORDER — VITAMIN D 25 MCG (1000 UNIT) PO TABS
2000.0000 [IU] | ORAL_TABLET | Freq: Every day | ORAL | Status: DC
  Administered 2021-03-29 – 2021-04-01 (×4): 2000 [IU] via ORAL
  Filled 2021-03-28 (×4): qty 2

## 2021-03-28 MED ORDER — DIPHENHYDRAMINE HCL 25 MG PO TABS: 25.0000 mg | ORAL_TABLET | Freq: Every evening | ORAL | Status: DC | PRN

## 2021-03-28 MED ORDER — BUPIVACAINE HCL (PF) 0.5 % IJ SOLN
INTRAMUSCULAR | Status: AC
  Filled 2021-03-28: qty 30

## 2021-03-28 MED ORDER — LIDOCAINE 5 % EX PTCH
1.0000 | MEDICATED_PATCH | CUTANEOUS | Status: DC
  Administered 2021-03-28 – 2021-04-01 (×5): 1 via TRANSDERMAL
  Filled 2021-03-28 (×4): qty 1

## 2021-03-28 MED ORDER — 0.9 % SODIUM CHLORIDE (POUR BTL) OPTIME
TOPICAL | Status: DC | PRN
  Administered 2021-03-28: 200 mL

## 2021-03-28 MED ORDER — ASCORBIC ACID 500 MG PO TABS
500.0000 mg | ORAL_TABLET | Freq: Every day | ORAL | Status: DC
  Administered 2021-03-29 – 2021-04-01 (×4): 500 mg via ORAL
  Filled 2021-03-28 (×4): qty 1

## 2021-03-28 MED ORDER — ACETAMINOPHEN 10 MG/ML IV SOLN
INTRAVENOUS | Status: AC
  Filled 2021-03-28: qty 100

## 2021-03-28 MED ORDER — ADULT MULTIVITAMIN W/MINERALS CH
1.0000 | ORAL_TABLET | Freq: Every day | ORAL | Status: DC
  Administered 2021-03-29 – 2021-04-01 (×4): 1 via ORAL
  Filled 2021-03-28 (×4): qty 1

## 2021-03-28 MED ORDER — ZOLPIDEM TARTRATE 5 MG PO TABS
5.0000 mg | ORAL_TABLET | Freq: Every evening | ORAL | Status: DC | PRN
  Administered 2021-03-31: 5 mg via ORAL
  Filled 2021-03-28: qty 1

## 2021-03-28 MED ORDER — PHENOL 1.4 % MT LIQD
1.0000 | OROMUCOSAL | Status: DC | PRN
  Filled 2021-03-28: qty 177

## 2021-03-28 MED ORDER — ALPRAZOLAM 0.25 MG PO TABS: 0.2500 mg | ORAL_TABLET | Freq: Every evening | ORAL | Status: DC | PRN

## 2021-03-28 MED ORDER — BISACODYL 10 MG RE SUPP
10.0000 mg | Freq: Every day | RECTAL | Status: DC | PRN
  Administered 2021-03-30: 10 mg via RECTAL
  Filled 2021-03-28: qty 1

## 2021-03-28 MED ORDER — FENTANYL CITRATE (PF) 100 MCG/2ML IJ SOLN
INTRAMUSCULAR | Status: AC
  Filled 2021-03-28: qty 2

## 2021-03-28 MED ORDER — LACTATED RINGERS IV SOLN: INTRAVENOUS | Status: DC | PRN

## 2021-03-28 MED ORDER — FENTANYL CITRATE (PF) 100 MCG/2ML IJ SOLN
INTRAMUSCULAR | Status: DC | PRN
Start: 2021-03-28 — End: 2021-03-28
  Administered 2021-03-28: 25 ug via INTRAVENOUS

## 2021-03-28 MED ORDER — CEFAZOLIN SODIUM-DEXTROSE 2-3 GM-%(50ML) IV SOLR
INTRAVENOUS | Status: DC | PRN
  Administered 2021-03-28: 2 g via INTRAVENOUS

## 2021-03-28 MED ORDER — PROPOFOL 10 MG/ML IV BOLUS
INTRAVENOUS | Status: DC | PRN
Start: 2021-03-28 — End: 2021-03-28
  Administered 2021-03-28: 20 mg via INTRAVENOUS

## 2021-03-28 MED ORDER — OXYCODONE HCL 5 MG/5ML PO SOLN: 5.0000 mg | Freq: Once | ORAL | Status: DC | PRN

## 2021-03-28 MED ORDER — MAGNESIUM HYDROXIDE 400 MG/5ML PO SUSP
30.0000 mL | Freq: Every day | ORAL | Status: DC | PRN
  Administered 2021-03-30: 30 mL via ORAL
  Filled 2021-03-28: qty 30

## 2021-03-28 MED ORDER — TRANEXAMIC ACID-NACL 1000-0.7 MG/100ML-% IV SOLN
1000.0000 mg | Freq: Once | INTRAVENOUS | Status: AC
  Administered 2021-03-28: 1000 mg via INTRAVENOUS
  Filled 2021-03-28: qty 100

## 2021-03-28 MED ORDER — SODIUM CHLORIDE 0.9 % IV SOLN: INTRAVENOUS | Status: DC

## 2021-03-28 MED ORDER — METOCLOPRAMIDE HCL 5 MG/ML IJ SOLN: 5.0000 mg | Freq: Three times a day (TID) | INTRAMUSCULAR | Status: DC | PRN

## 2021-03-28 MED ORDER — BUPIVACAINE HCL (PF) 0.5 % IJ SOLN
INTRAMUSCULAR | Status: DC | PRN
  Administered 2021-03-28: 20 mL

## 2021-03-28 MED ORDER — MENTHOL 3 MG MT LOZG
1.0000 | LOZENGE | OROMUCOSAL | Status: DC | PRN
  Filled 2021-03-28: qty 9

## 2021-03-28 MED ORDER — PANTOPRAZOLE SODIUM 40 MG PO TBEC
40.0000 mg | DELAYED_RELEASE_TABLET | Freq: Every day | ORAL | Status: DC
  Administered 2021-03-29 – 2021-04-01 (×4): 40 mg via ORAL
  Filled 2021-03-28 (×4): qty 1

## 2021-03-28 MED ORDER — ASPIRIN 81 MG PO TBEC: 81.0000 mg | DELAYED_RELEASE_TABLET | Freq: Every day | ORAL | Status: DC

## 2021-03-28 MED ORDER — CALCIUM CARBONATE 1250 (500 CA) MG PO TABS
1.0000 | ORAL_TABLET | Freq: Every day | ORAL | Status: DC
  Administered 2021-03-29 – 2021-04-01 (×4): 500 mg via ORAL
  Filled 2021-03-28 (×4): qty 1

## 2021-03-28 SURGICAL SUPPLY — 35 items
APL PRP STRL LF DISP 70% ISPRP (MISCELLANEOUS) ×1
BIT DRILL CANN LRG QC 5X300 (BIT) ×1 IMPLANT
CHLORAPREP W/TINT 26 (MISCELLANEOUS) ×2 IMPLANT
DRSG OPSITE POSTOP 4X6 (GAUZE/BANDAGES/DRESSINGS) ×1 IMPLANT
DRSG TEGADERM 6X8 (GAUZE/BANDAGES/DRESSINGS) ×1 IMPLANT
ELECT REM PT RETURN 9FT ADLT (ELECTROSURGICAL) ×2
ELECTRODE REM PT RTRN 9FT ADLT (ELECTROSURGICAL) ×1 IMPLANT
GAUZE SPONGE 4X4 12PLY STRL (GAUZE/BANDAGES/DRESSINGS) ×2 IMPLANT
GAUZE XEROFORM 1X8 LF (GAUZE/BANDAGES/DRESSINGS) ×1 IMPLANT
GLOVE SURG SYN 9.0  PF PI (GLOVE) ×2
GLOVE SURG SYN 9.0 PF PI (GLOVE) ×1 IMPLANT
GLOVE SURG UNDER POLY LF SZ9 (GLOVE) ×2 IMPLANT
GOWN SRG 2XL LVL 4 RGLN SLV (GOWNS) ×1 IMPLANT
GOWN STRL NON-REIN 2XL LVL4 (GOWNS) ×2
GOWN STRL REUS W/ TWL LRG LVL3 (GOWN DISPOSABLE) ×1 IMPLANT
GOWN STRL REUS W/TWL LRG LVL3 (GOWN DISPOSABLE) ×2
GUIDEWIRE THREADED 2.8 (WIRE) ×4 IMPLANT
KIT TURNOVER KIT A (KITS) ×2 IMPLANT
MANIFOLD NEPTUNE II (INSTRUMENTS) ×2 IMPLANT
NDL FILTER BLUNT 18X1 1/2 (NEEDLE) ×1 IMPLANT
NEEDLE FILTER BLUNT 18X 1/2SAF (NEEDLE)
NEEDLE FILTER BLUNT 18X1 1/2 (NEEDLE) IMPLANT
NS IRRIG 500ML POUR BTL (IV SOLUTION) ×2 IMPLANT
PACK HIP COMPR (MISCELLANEOUS) ×2 IMPLANT
SCALPEL PROTECTED #10 DISP (BLADE) ×4 IMPLANT
SCREW CANN 32 THRD/80 7.3 (Screw) ×2 IMPLANT
SCREW CANN THREADED 7.3X85 (Screw) ×2 IMPLANT
STAPLER SKIN PROX 35W (STAPLE) ×2 IMPLANT
SUT PROLENE 2 0 FS (SUTURE) ×2 IMPLANT
SUT VIC AB 2-0 SH 27 (SUTURE) ×2
SUT VIC AB 2-0 SH 27XBRD (SUTURE) ×1 IMPLANT
SYR 20ML LL LF (SYRINGE) ×1 IMPLANT
SYR 5ML LL (SYRINGE) ×1 IMPLANT
TAPE MICROFOAM 4IN (TAPE) ×1 IMPLANT
WATER STERILE IRR 500ML POUR (IV SOLUTION) ×2 IMPLANT

## 2021-03-28 NOTE — Anesthesia Postprocedure Evaluation (Signed)
Anesthesia Post Note  Patient: Barista  Procedure(s) Performed: CANNULATED HIP PINNING (Left: Hip)  Patient location during evaluation: PACU Anesthesia Type: General Level of consciousness: awake and alert Pain management: pain level controlled Vital Signs Assessment: post-procedure vital signs reviewed and stable Respiratory status: spontaneous breathing, nonlabored ventilation, respiratory function stable and patient connected to nasal cannula oxygen Cardiovascular status: blood pressure returned to baseline and stable Postop Assessment: no apparent nausea or vomiting Anesthetic complications: no   No notable events documented.   Last Vitals:  Vitals:   03/28/21 2030 03/28/21 2050  BP: 119/60 (!) 117/59  Pulse: 60 (!) 59  Resp: 13 20  Temp:  37 C  SpO2: 97% 100%    Last Pain:  Vitals:   03/28/21 2050  TempSrc: Oral  PainSc:                  [place] Ouch

## 2021-03-28 NOTE — Plan of Care (Signed)
Patient arrived to room 138 in stable condition. Aox4. Reports 7/10 left hip pain. Plan of care reviewed with patient. Surgery tomorrow. NPO after midnight. Call bell usage explained.   PLAN OF CARE ONGOING Problem: Education: Goal: Knowledge of General Education information will improve Description: Including pain rating scale, medication(s)/side effects and non-pharmacologic comfort measures Outcome: Progressing   Problem: Health Behavior/Discharge Planning: Goal: Ability to manage health-related needs will improve Outcome: Progressing   Problem: Clinical Measurements: Goal: Ability to maintain clinical measurements within normal limits will improve Outcome: Progressing Goal: Will remain free from infection Outcome: Progressing Goal: Diagnostic test results will improve Outcome: Progressing Goal: Respiratory complications will improve Outcome: Progressing Goal: Cardiovascular complication will be avoided Outcome: Progressing   Problem: Activity: Goal: Risk for activity intolerance will decrease Outcome: Progressing   Problem: Nutrition: Goal: Adequate nutrition will be maintained Outcome: Progressing   Problem: Coping: Goal: Level of anxiety will decrease Outcome: Progressing   Problem: Elimination: Goal: Will not experience complications related to bowel motility Outcome: Progressing Goal: Will not experience complications related to urinary retention Outcome: Progressing   Problem: Pain Managment: Goal: General experience of comfort will improve Outcome: Progressing   Problem: Safety: Goal: Ability to remain free from injury will improve Outcome: Progressing   Problem: Skin Integrity: Goal: Risk for impaired skin integrity will decrease Outcome: Progressing

## 2021-03-28 NOTE — Anesthesia Preprocedure Evaluation (Addendum)
Anesthesia Evaluation  Patient identified by MRN, date of birth, ID band Patient awake    Reviewed: Allergy & Precautions, NPO status , Patient's Chart, lab work & pertinent test results, reviewed documented beta blocker date and time   Airway Mallampati: III  TM Distance: >3 FB Neck ROM: full  Mouth opening: Limited Mouth Opening  Dental  (+) Edentulous Upper   Pulmonary neg pulmonary ROS, Current Smoker,    Pulmonary exam normal        Cardiovascular hypertension, Pt. on medications and Pt. on home beta blockers + CAD  Normal cardiovascular exam+ Valvular Problems/Murmurs MR      Neuro/Psych  Neuromuscular disease (sciatica) negative psych ROS   GI/Hepatic negative GI ROS, Neg liver ROS,   Endo/Other  negative endocrine ROS  Renal/GU CRFRenal disease  negative genitourinary   Musculoskeletal   Abdominal   Peds  Hematology  (+) Blood dyscrasia, anemia ,   Anesthesia Other Findings Hip fracture after her legs "gave out on her." She has had falls and weakness x3 months.  EKG: Sinus bradycardia with occasional Premature ventricular complexes Nonspecific ST abnormality Abnormal ECG When compared with ECG of 27-Mar-2021 15:08, (unconfirmed) Premature ventricular complexes are now Present  ECHO 2017: Summary  Global left ventricular systolic function is normal.  The estimated left ventricular ejection fraction is ~ 60%.  There is moderate concentric left ventricular hypertrophy seen, with more  focal basal septal hypertrophy seen.  Impaired relaxation, grade 1 diastolic dysfunction.  There is an apparent LVOT ouflow tract gradient, with a peak gradient of  69mmHg (peak velocity 2,52m/s)  Segmental function is difficult to assess given limited endocardial  visualization.  Moderate mitral regurgitation is present.  There is systolic anterior motion of the anterior mitral valve leaflet  consistent with  obstructive physiology.  Mild sclerosis of the trileaflet aortic valve, with adequate leaflet  motion (peak gradient of 22 mmHg and a mean gradient of 15 mmHg).   Reproductive/Obstetrics negative OB ROS                            Anesthesia Physical Anesthesia Plan  ASA: 3  Anesthesia Plan: General   Post-op Pain Management:    Induction: Intravenous  PONV Risk Score and Plan: Propofol infusion, TIVA, Treatment may vary due to age or medical condition and Ondansetron  Airway Management Planned: Natural Airway and Simple Face Mask  Additional Equipment:   Intra-op Plan:   Post-operative Plan:   Informed Consent: I have reviewed the patients History and Physical, chart, labs and discussed the procedure including the risks, benefits and alternatives for the proposed anesthesia with the patient or authorized representative who has indicated his/her understanding and acceptance.   Patient has DNR.  Suspend DNR and Discussed DNR with patient.   Dental advisory given  Plan Discussed with: Anesthesiologist, CRNA and Surgeon  Anesthesia Plan Comments:       Anesthesia Quick Evaluation

## 2021-03-28 NOTE — Op Note (Signed)
03/28/2021  7:37 PM  PATIENT:  Chelsea Hart  88 y.o. female  PRE-OPERATIVE DIAGNOSIS:  Hip fracture, impacted femoral neck  POST-OPERATIVE DIAGNOSIS:  Hip fracture same  PROCEDURE:  Procedure(s): CANNULATED HIP PINNING (Left)  SURGEON: Laurene Footman, MD  ASSISTANTS: None  ANESTHESIA:   local and MAC  EBL:  No intake/output data recorded.  BLOOD ADMINISTERED:none  DRAINS: none   LOCAL MEDICATIONS USED:  MARCAINE     SPECIMEN:  No Specimen  DISPOSITION OF SPECIMEN:  N/A  COUNTS:  YES  TOURNIQUET:  * No tourniquets in log *  IMPLANTS: Synthes 7.3 cannulated screw x4  DICTATION: .Dragon Dictation patient was brought to the operating room and after adequate anesthesia was obtained the patient was transferred to the fracture table with the left leg in the traction boot without traction applied and right leg in the well-leg holder.  C-arm was brought in and good visualization of both AP lateral projections was obtained.  After appropriate patient identification and timeout procedure were completed C-arm was brought in and after appropriate position a 22-gauge needle was inserted and 20 cc of half percent Sensorcaine plain was infiltrated in the area of the planned incision to aid in anesthesia.  The hip was then prepped and draped using a barrier drape method.  A lateral incision was made approximately 3 cm in length and subcutaneous tissue spread with a guidewire inserted into a center center position with soft tissue protector sleeve.  Based on this image on both AP and lateral the bullet targeting device was applied and a slightly inferior anterior and posterior guidewires were placed.  Checking position in both AP and lateral position appropriate position of the pins is felt to be obtained.  Measurements were made drilling tapping and then placing the cannulated screws with long threaded 7.3 cannulated screws two 85 and two 80 mm screws placed.  There is no penetration into the  joint.  The wound was irrigated after permanent C arm views were obtained in AP and lateral projections and guidewires removed.  The wound was closed with 2-0 Vicryl subcutaneously skin staples Xeroform and honeycomb dressing.  PLAN OF CARE: Admit to inpatient   PATIENT DISPOSITION:  PACU - hemodynamically stable.

## 2021-03-28 NOTE — Progress Notes (Signed)
PROGRESS NOTE    Chelsea Hart  ZRA:[id number] DOB: [birth year] DOA: 03/27/2021 PCP: Pcp, No   Chief complaint left hip pain. Brief Narrative:  88 year old female who is from [place] who is a here visiting her son.  She also dropped off a friend in [place].  Patient still driving.  She lives independently.  She states that she fell at her son's house yesterday morning.  She was able to get back up.  She states that her left leg gave out from her when she was walking.   X-ray showed a left hip fracture.  Assessment & Plan:   Principal Problem:   Closed left hip fracture (HCC) Active Problems:   CKD (chronic kidney disease) stage 3, GFR 30-59 ml/min (HCC)   Essential hypertension   Coronary artery disease due to lipid rich plaque   Simple chronic bronchitis (HCC)   Smoker   DNR (do not resuscitate)/DNI(Do Not Intubate)  Left hip fracture. Patient is scheduled for surgery today.  Currently NPO.  Start fluids to prevent dehydration.  Chronic kidney disease stage IIIa. Reviewed lab results from outside facility, GFR was 46 last year, renal function still stable.  Essential hypertension plan Continue beta-blocker.  Coronary artery disease. Continue Lopressor and Zocor.  Chronic bronchitis Tobacco abuse. Advised to quit.  Neck pain. Secondary to arthritis.  CT scan negative.  Continue pain medicine and the Lidoderm patch.  DVT prophylaxis: SCds Code Status: DNR Family Communication:  Disposition Plan:    Status is: Inpatient  Remains inpatient appropriate because: Pending procedure.        I/O last 3 completed shifts: In: -  Out: 200 [Urine:200] No intake/output data recorded.     Consultants:  orthopedics  Procedures: pending  Antimicrobials: None  Subjective: Patient is complaining of neck pain which has been happening for the last 2 weeks.  Hip pain is better with pain medicine. Denies any short of breath or chest pain. No  abdominal pain or nausea vomiting.  Objective: Vitals:   03/27/21 2141 03/27/21 2347 03/28/21 0349 03/28/21 0844  BP: (!) 147/75 (!) 97/56 107/62 122/60  Pulse: 65 62 (!) 58 60  Resp: 18 14 18 18   Temp: 98.3 F (36.8 C) 98.2 F (36.8 C) 97.8 F (36.6 C) 98.3 F (36.8 C)  TempSrc:      SpO2: 93% 93% 94% 93%  Weight:      Height:        Intake/Output Summary (Last 24 hours) at 03/28/2021 1058 Last data filed at 03/28/2021 1023 Gross per 24 hour  Intake 0 ml  Output 200 ml  Net -200 ml   Filed Weights   03/27/21 1508  Weight: 70.3 kg    Examination:  General exam: Appears calm and comfortable  Respiratory system: Clear to auscultation. Respiratory effort normal. Cardiovascular system: S1 & S2 heard, RRR. No JVD, murmurs, rubs, gallops or clicks. No pedal edema. Gastrointestinal system: Abdomen is nondistended, soft and nontender. No organomegaly or masses felt. Normal bowel sounds heard. Central nervous system: Alert and oriented x3. No focal neurological deficits. Extremities: Symmetric 5 x 5 power. Skin: No rashes, lesions or ulcers Psychiatry: Judgement and insight appear normal. Mood & affect appropriate.     Data Reviewed: I have personally reviewed following labs and imaging studies  CBC: Recent Labs  Lab 03/27/21 1510 03/28/21 0558  WBC 9.6 10.3  NEUTROABS  --  6.4  HGB 11.5* 10.5*  HCT 34.4* 32.1*  MCV 95.0 98.5  PLT 278  756   Basic Metabolic Panel: Recent Labs  Lab 03/27/21 1510 03/28/21 0558  NA 137 137  K 3.6 3.5  CL 103 104  CO2 25 24  GLUCOSE 108* 121*  BUN 17 20  CREATININE 1.13* 1.35*  CALCIUM 10.1 9.4   GFR: Estimated Creatinine Clearance: 28.7 mL/min (A) (by C-G formula based on SCr of 1.35 mg/dL (H)). Liver Function Tests: Recent Labs  Lab 03/28/21 0558  AST 19  ALT 15  ALKPHOS 64  BILITOT 0.5  PROT 6.5  ALBUMIN 3.3*   No results for input(s): LIPASE, AMYLASE in the last 168 hours. No results for input(s): AMMONIA in  the last 168 hours. Coagulation Profile: No results for input(s): INR, PROTIME in the last 168 hours. Cardiac Enzymes: Recent Labs  Lab 03/27/21 1510  CKTOTAL 106   BNP (last 3 results) No results for input(s): PROBNP in the last 8760 hours. HbA1C: No results for input(s): HGBA1C in the last 72 hours. CBG: No results for input(s): GLUCAP in the last 168 hours. Lipid Profile: No results for input(s): CHOL, HDL, LDLCALC, TRIG, CHOLHDL, LDLDIRECT in the last 72 hours. Thyroid Function Tests: No results for input(s): TSH, T4TOTAL, FREET4, T3FREE, THYROIDAB in the last 72 hours. Anemia Panel: No results for input(s): VITAMINB12, FOLATE, FERRITIN, TIBC, IRON, RETICCTPCT in the last 72 hours. Sepsis Labs: No results for input(s): PROCALCITON, LATICACIDVEN in the last 168 hours.  Recent Results (from the past 240 hour(s))  Resp Panel by RT-PCR (Flu A&B, Covid) Nasopharyngeal Swab     Status: None   Collection Time: 03/27/21  6:54 PM   Specimen: Nasopharyngeal Swab; Nasopharyngeal(NP) swabs in vial transport medium  Result Value Ref Range Status   SARS Coronavirus 2 by RT PCR NEGATIVE NEGATIVE Final    Comment: (NOTE) SARS-CoV-2 target nucleic acids are NOT DETECTED.  The SARS-CoV-2 RNA is generally detectable in upper respiratory specimens during the acute phase of infection. The lowest concentration of SARS-CoV-2 viral copies this assay can detect is 138 copies/mL. A negative result does not preclude SARS-Cov-2 infection and should not be used as the sole basis for treatment or other patient management decisions. A negative result may occur with  improper specimen collection/handling, submission of specimen other than nasopharyngeal swab, presence of viral mutation(s) within the areas targeted by this assay, and inadequate number of viral copies(<138 copies/mL). A negative result must be combined with clinical observations, patient history, and epidemiological information. The  expected result is Negative.  Fact Sheet for Patients:  EntrepreneurPulse.com.au  Fact Sheet for Healthcare Providers:  IncredibleEmployment.be  This test is no t yet approved or cleared by the [place] FDA and  has been authorized for detection and/or diagnosis of SARS-CoV-2 by FDA under an Emergency Use Authorization (EUA). This EUA will remain  in effect (meaning this test can be used) for the duration of the COVID-19 declaration under Section 564(b)(1) of the Act, 21 U.S.C.section 360bbb-3(b)(1), unless the authorization is terminated  or revoked sooner.       Influenza A by PCR NEGATIVE NEGATIVE Final   Influenza B by PCR NEGATIVE NEGATIVE Final    Comment: (NOTE) The Xpert Xpress SARS-CoV-2/FLU/RSV plus assay is intended as an aid in the diagnosis of influenza from Nasopharyngeal swab specimens and should not be used as a sole basis for treatment. Nasal washings and aspirates are unacceptable for Xpert Xpress SARS-CoV-2/FLU/RSV testing.  Fact Sheet for Patients: EntrepreneurPulse.com.au  Fact Sheet for Healthcare Providers: IncredibleEmployment.be  This test is not yet approved  or cleared by the [place] and has been authorized for detection and/or diagnosis of SARS-CoV-2 by FDA under an Emergency Use Authorization (EUA). This EUA will remain in effect (meaning this test can be used) for the duration of the COVID-19 declaration under Section 564(b)(1) of the Act, 21 U.S.C. section 360bbb-3(b)(1), unless the authorization is terminated or revoked.  Performed at [facility], [address] [place], [place] [address]   Surgical PCR screen     Status: None   Collection Time: 03/28/21  4:04 AM   Specimen: Nasal Mucosa; Nasal Swab  Result Value Ref Range Status   MRSA, PCR NEGATIVE NEGATIVE Final   Staphylococcus aureus NEGATIVE NEGATIVE Final    Comment: (NOTE) The Xpert  SA Assay (FDA approved for NASAL specimens in patients 65 years of age and older), is one component of a comprehensive surveillance program. It is not intended to diagnose infection nor to guide or monitor treatment. Performed at [facility], [address], [place], [place] [address]          Radiology Studies: DG Chest 1 View  Result Date: 03/27/2021 CLINICAL DATA:  Weakness and fall.  Question infiltrate. EXAM: CHEST  1 VIEW COMPARISON:  04/14/2005 FINDINGS: Artifact overlies the chest. Heart size is normal. Mild aortic atherosclerotic tortuosity and calcification. The right chest is clear and normal. There is mild elevation of the left hemidiaphragm, with mild crowding of the markings at the lung base because of that, but the lung appears otherwise clear. No effusion. IMPRESSION: No acute or traumatic finding. Mild elevation of the left hemidiaphragm with mild crowding of the markings at the left base because of that. Electronically Signed   By: Nelson Chimes M.D.   On: 03/27/2021 16:13   CT HEAD WO CONTRAST (5MM)  Result Date: 03/27/2021 CLINICAL DATA:  Head trauma, minor (Age >= 65y) EXAM: CT HEAD WITHOUT CONTRAST TECHNIQUE: Contiguous axial images were obtained from the base of the skull through the vertex without intravenous contrast. RADIATION DOSE REDUCTION: This exam was performed according to the departmental dose-optimization program which includes automated exposure control, adjustment of the mA and/or kV according to patient size and/or use of iterative reconstruction technique. COMPARISON:  None. FINDINGS: Brain: No evidence of acute infarction, hemorrhage, hydrocephalus, extra-axial collection or mass lesion/mass effect. Focal area of encephalomalacia in the right frontal lobe compatible with remote ischemia. Multiple prior lacunar infarcts in the bilateral basal ganglia. Extensive low-density changes within the periventricular and subcortical white matter compatible  with chronic microvascular ischemic change. Mild diffuse cerebral volume loss. Vascular: Atherosclerotic calcifications involving the large vessels of the skull base. No unexpected hyperdense vessel. Skull: Normal. Negative for fracture or focal lesion. Sinuses/Orbits: No acute finding. Other: None. IMPRESSION: 1. No acute intracranial abnormality. 2. Extensive chronic microvascular ischemic changes and cerebral volume loss. Electronically Signed   By: Davina Poke D.O.   On: 03/27/2021 16:40   CT Cervical Spine Wo Contrast  Result Date: 03/27/2021 CLINICAL DATA:  Neck trauma EXAM: CT CERVICAL SPINE WITHOUT CONTRAST TECHNIQUE: Multidetector CT imaging of the cervical spine was performed without intravenous contrast. Multiplanar CT image reconstructions were also generated. RADIATION DOSE REDUCTION: This exam was performed according to the departmental dose-optimization program which includes automated exposure control, adjustment of the mA and/or kV according to patient size and/or use of iterative reconstruction technique. COMPARISON:  None. FINDINGS: Alignment: Grade 1 anterolisthesis of C3 on C4 and C7 on T1. No evidence of acute subluxation. Skull base and  vertebrae: No acute fracture. No primary bone lesion or focal pathologic process. Soft tissues and spinal canal: No prevertebral fluid or swelling. No visible canal hematoma. Disc levels: Moderate to severe intervertebral disc space narrowing from C4 through C7. Associated endplate sclerosis and dorsal endplate osteophytes most prominent at C5-C6. Bilateral facet arthropathy. Mild neural foraminal narrowing at C5-C6 on the right. Upper chest: Heterogeneous thyroid gland with likely nodules on the right measuring up to 1.5 cm. Mild biapical pleural thickening. Other: None. IMPRESSION: 1. No acute fracture or subluxation identified. Degenerative changes of the cervical spine. 2. Heterogeneous nodular thyroid gland, consider follow-up ultrasound.  Electronically Signed   By: Ofilia Neas M.D.   On: 03/27/2021 16:37   DG Knee Complete 4 Views Left  Result Date: 03/27/2021 CLINICAL DATA:  [place] yesterday.  Knee pain. EXAM: LEFT KNEE - COMPLETE 4+ VIEW COMPARISON:  None. FINDINGS: No joint effusion. No fracture. Chronic appearing joint space narrowing with chondrocalcinosis, typical of age. No focal lesion. IMPRESSION: No acute or traumatic finding. Joint space narrowing and chondrocalcinosis. Electronically Signed   By: Nelson Chimes M.D.   On: 03/27/2021 16:11   DG Hip Unilat W or Wo Pelvis 2-3 Views Left  Result Date: 03/27/2021 CLINICAL DATA:  [place] yesterday with left hip pain. EXAM: DG HIP (WITH OR WITHOUT PELVIS) 2-3V LEFT COMPARISON:  None. FINDINGS: There is a fracture of the left femoral neck with slight impaction. No intertrochanteric component. The bones of the pelvis are negative. IMPRESSION: Left femoral neck fracture with minimal impaction. Electronically Signed   By: Nelson Chimes M.D.   On: 03/27/2021 16:10        Scheduled Meds:  aspirin EC  81 mg Oral BID   docusate sodium  100 mg Oral BID   fenofibrate  54 mg Oral Daily   latanoprost  1 drop Both Eyes QHS   metoprolol tartrate  25 mg Oral BID   PARoxetine  40 mg Oral Daily   simvastatin  20 mg Oral QHS   Continuous Infusions:  0.9 % NaCl with KCl 20 mEq / L      ceFAZolin (ANCEF) IV     methocarbamol (ROBAXIN) IV       LOS: 1 day    Time spent: 27 minutes    Sharen Hones, MD [facility]   To contact the attending provider between 7A-7P or the covering provider during after hours 7P-7A, please log into the web site [email or web address] and access using universal [place] password for that web site. If you do not have the password, please call the hospital operator.  03/28/2021, 10:58 AM

## 2021-03-28 NOTE — Progress Notes (Signed)
Patient with persisting erythema and swelling to the lateral thigh.  CT scan appears to show extra-articular abscess.  Plans for ear incision and drainage possible wound VAC application.

## 2021-03-28 NOTE — Transfer of Care (Signed)
Immediate Anesthesia Transfer of Care Note  Patient: Chelsea Hart  Procedure(s) Performed: CANNULATED HIP PINNING (Left: Hip)  Patient Location: PACU  Anesthesia Type:General  Level of Consciousness: awake, alert  and oriented  Airway & Oxygen Therapy: Patient Spontanous Breathing, [place] 2L  Post-op Assessment: Report given to RN and Post -op Vital signs reviewed and stable  Post vital signs: Reviewed and stable  Last Vitals:  Vitals Value Taken Time  BP 134/59 03/28/21 1937  Temp 36.6 C 03/28/21 1937  Pulse 65 03/28/21 1937  Resp 15 03/28/21 1937  SpO2 92 % 03/28/21 1937  Vitals shown include unvalidated device data.  Last Pain:  Vitals:   03/28/21 1937  TempSrc:   PainSc: 0-No pain         Complications: No notable events documented.

## 2021-03-28 NOTE — Progress Notes (Signed)
Initial Nutrition Assessment  DOCUMENTATION CODES:   Not applicable  INTERVENTION:   -Once diet is advanced, add:   -Ensure Enlive po BID, each supplement provides 350 kcal and 20 grams of protein  -MVI with minerals daily  NUTRITION DIAGNOSIS:   Increased nutrient needs related to post-op healing as evidenced by estimated needs.  GOAL:   Patient will meet greater than or equal to 90% of their needs  MONITOR:   PO intake, Supplement acceptance, Diet advancement, Labs, Weight trends, Skin, I & O's  REASON FOR ASSESSMENT:   Consult Assessment of nutrition requirement/status, Hip fracture protocol  ASSESSMENT:   88 year old female who is from [place] who is a here visiting her son.  She also dropped off a friend in [place].  Patient still driving.  She lives independently.  She states that she fell at her son's house yesterday morning.  She was able to get back up.  She states that her left leg gave out from her when she was walking.  She has been able to walk around on it yesterday.  When she try to get up this morning she fell again.  Her son urged her to come to the ER.  Pt admitted with closed lt hip fracture.   Reviewed I/O's: -200 ml x 24 hours  UOP: 200 ml x 24 hours  Spoke with pt at bedside, who was very anxious regarding timing of surgery. Pt shares that the last time she ate was on Thursday morning (chocolate milk). She endorses good appetite PTA. She expressed desire to eat, but reports that she may have surgery today (pt refused breakfast tray); reports "if I don't eat and get fluids soon, I'm going to be dehydrated". RD provided emotional support and reflective listening.   Pt awaiting orthopedics consult.    Medications reviewed and include colace and 0.9% Na Cl with KCl 20 mEq/L infusion @ 75 ml/hr.   Labs reviewed.    NUTRITION - FOCUSED PHYSICAL EXAM:  Flowsheet Row Most Recent Value  Orbital Region No depletion  Upper Arm Region No  depletion  Thoracic and Lumbar Region No depletion  Buccal Region No depletion  Temple Region No depletion  Clavicle Bone Region No depletion  Clavicle and Acromion Bone Region No depletion  Scapular Bone Region No depletion  Dorsal Hand No depletion  Patellar Region No depletion  Anterior Thigh Region No depletion  Posterior Calf Region No depletion  Edema (RD Assessment) None  Hair Reviewed  Eyes Reviewed  Mouth Reviewed  Skin Reviewed  Nails Reviewed       Diet Order:   Diet Order             Diet NPO time specified  Diet effective now                   EDUCATION NEEDS:   Education needs have been addressed  Skin:  Skin Assessment: Reviewed RN Assessment  Last BM:  03/26/21  Height:   Ht Readings from Last 1 Encounters:  03/27/21 5\' 3"  (1.6 m)    Weight:   Wt Readings from Last 1 Encounters:  03/27/21 70.3 kg    Ideal Body Weight:  52.3 kg  BMI:  Body mass index is 27.46 kg/m.  Estimated Nutritional Needs:   Kcal:  6644-0347  Protein:  85-100 grams  Fluid:  > 1.7 L    Loistine Chance, RD, LDN, [place] Registered Dietitian II Certified Diabetes Care and Education Specialist Please refer  to [facility] for RD and/or RD on-call/weekend/after hours pager

## 2021-03-28 NOTE — TOC CM/SW Note (Signed)
CSW acknowledges consult for SNF/HH needs. Please consult PT and OT whenever appropriate so she can be evaluated.  [place] Scrape, [place]

## 2021-03-29 ENCOUNTER — Inpatient Hospital Stay: Payer: Medicare Other

## 2021-03-29 DIAGNOSIS — G459 Transient cerebral ischemic attack, unspecified: Secondary | ICD-10-CM

## 2021-03-29 DIAGNOSIS — R29810 Facial weakness: Secondary | ICD-10-CM

## 2021-03-29 LAB — CBC WITH DIFFERENTIAL/PLATELET
Abs Immature Granulocytes: 0.04 10*3/uL (ref 0.00–0.07)
Basophils Absolute: 0.1 10*3/uL (ref 0.0–0.1)
Basophils Relative: 1 %
Eosinophils Absolute: 0.3 10*3/uL (ref 0.0–0.5)
Eosinophils Relative: 2 %
HCT: 29.5 % — ABNORMAL LOW (ref 36.0–46.0)
Hemoglobin: 9.6 g/dL — ABNORMAL LOW (ref 12.0–15.0)
Immature Granulocytes: 0 %
Lymphocytes Relative: 19 %
Lymphs Abs: 2 10*3/uL (ref 0.7–4.0)
MCH: 32 pg (ref 26.0–34.0)
MCHC: 32.5 g/dL (ref 30.0–36.0)
MCV: 98.3 fL (ref 80.0–100.0)
Monocytes Absolute: 1.3 10*3/uL — ABNORMAL HIGH (ref 0.1–1.0)
Monocytes Relative: 12 %
Neutro Abs: 6.8 10*3/uL (ref 1.7–7.7)
Neutrophils Relative %: 66 %
Platelets: 241 10*3/uL (ref 150–400)
RBC: 3 MIL/uL — ABNORMAL LOW (ref 3.87–5.11)
RDW: 13.1 % (ref 11.5–15.5)
WBC: 10.4 10*3/uL (ref 4.0–10.5)
nRBC: 0 % (ref 0.0–0.2)

## 2021-03-29 LAB — BASIC METABOLIC PANEL
Anion gap: 8 (ref 5–15)
BUN: 23 mg/dL (ref 8–23)
CO2: 26 mmol/L (ref 22–32)
Calcium: 8.9 mg/dL (ref 8.9–10.3)
Chloride: 106 mmol/L (ref 98–111)
Creatinine, Ser: 1.14 mg/dL — ABNORMAL HIGH (ref 0.44–1.00)
GFR, Estimated: 47 mL/min — ABNORMAL LOW (ref >60)
Glucose, Bld: 129 mg/dL — ABNORMAL HIGH (ref 70–99)
Potassium: 4.2 mmol/L (ref 3.5–5.1)
Sodium: 140 mmol/L (ref 135–145)

## 2021-03-29 LAB — MAGNESIUM: Magnesium: 1.8 mg/dL (ref 1.7–2.4)

## 2021-03-29 NOTE — Evaluation (Signed)
Occupational Therapy Evaluation Patient Details Name: Chelsea Hart MRN: [id number] DOB: 11-01-1933 Today's Date: 03/29/2021   History of Present Illness 88 year old female who is from [place] who is a here visiting her son.  Patient reports she drove herself from her home in [place] to [place].  Sister present and reports patient had a fall prior to getting to her son's house and then fell again coming out of the bathroom at her son's.  She lives independently.  Xrays revealed a hip fracture, pt underwent surgery for left cannulated hip pinning.   Clinical Impression   Pt seen for co evaluation with PT this date.  Pt sitting at edge of bed on arrival with assist from PT for sitting balance.  Pt's sister present during evaluation and able to provide limited details.  Pt presents with pain in hip, decreased transfers, decreased mobility, decreased balance for sitting and standing, muscle weakness, slow cognitive processing during evaluation and decreased ability to perform basic self care and IADL tasks.  Pt lives alone and was driving up until her falls.  She would benefit from skilled OT services to maximize safety and independence in necessary daily tasks.  Based on her performance during evaluation, recommend STR upon discharge.  Her daughter has planned to visit tomorrow and will likely be able to provide additional details regarding patient's baseline level of function and if her current cognitive status is different than her baseline.       Recommendations for follow up therapy are one component of a multi-disciplinary discharge planning process, led by the attending physician.  Recommendations may be updated based on patient status, additional functional criteria and insurance authorization.   Follow Up Recommendations  Skilled nursing-short term rehab (<3 hours/day)    Assistance Recommended at Discharge Frequent or constant Supervision/Assistance  Patient can return  home with the following A lot of help with bathing/dressing/bathroom;Assistance with cooking/housework;Direct supervision/assist for medications management;Assist for transportation;A lot of help with walking and/or transfers;Assistance with feeding;Direct supervision/assist for financial management;Help with stairs or ramp for entrance    Functional Status Assessment  Patient has had a recent decline in their functional status and demonstrates the ability to make significant improvements in function in a reasonable and predictable amount of time.  Equipment Recommendations       Recommendations for Other Services       Precautions / Restrictions Precautions Precautions: Fall Restrictions Weight Bearing Restrictions: Yes LLE Weight Bearing: Weight bearing as tolerated      Mobility Bed Mobility Overal bed mobility: Needs Assistance                  Transfers Overall transfer level: Needs assistance                 General transfer comment: Did not attemtpt to stand this date, pt worked with PT on sitting balance at edge of bed, mod to max initially and as the session progressed she required decreased assist (see PT note for further details )      Balance Overall balance assessment: Needs assistance Sitting-balance support: Feet supported, Bilateral upper extremity supported Sitting balance-Leahy Scale: Poor                                     ADL either performed or assessed with clinical judgement   ADL Overall ADL's : Needs assistance/impaired Eating/Feeding: Minimal assistance Eating/Feeding Details (indicate cue  type and reason): Sister indicated pt had difficulty managing food while in the hospital and may perform best with some yogurt, beef broth and mashed potatoes. Grooming: Set up;Bed level Grooming Details (indicate cue type and reason): Pt required assist for sitting balance and currently requires increased assist with any grooming in  sitting or standing. Upper Body Bathing: Minimal assistance;Moderate assistance   Lower Body Bathing: Maximal assistance   Upper Body Dressing : Minimal assistance;Moderate assistance   Lower Body Dressing: Maximal assistance     Toilet Transfer Details (indicate cue type and reason): Did not perform this date however anticipate she will require increased assist to complete. Toileting- Clothing Manipulation and Hygiene: Maximal assistance         General ADL Comments: PT reported pt initially had food pocketed in her cheek this am, sister reports this is normal for patient to do.     Vision   Additional Comments: Pt kept eyes closed often during session and required redirection to tasks.     Perception     Praxis      Pertinent Vitals/Pain       Hand Dominance Right   Extremity/Trunk Assessment Upper Extremity Assessment Upper Extremity Assessment: Generalized weakness   Lower Extremity Assessment Lower Extremity Assessment: Defer to PT evaluation       Communication Communication Communication: Other (comment) (Pt slow to process information at times and appears unclear as to the timeline and events that surround her fall and trip to her son's house.)   Cognition Arousal/Alertness: Awake/alert Behavior During Therapy: WFL for tasks assessed/performed Overall Cognitive Status: Difficult to assess                                 General Comments: Difficult to fully assess patient's cognitive status, she appeared to have slow processing skills, decreased recall of events, occasional difficulty with word finding during conversational speech.  She had her eyes closed often during session and appeared to have decreased awareness at times with body position in space.  Sister was present during evaluation and reports pt has been slow in the past to complete tasks in general.  It was unclear if pts current level of cognition and alertness is her baseline.      General Comments       Exercises     Shoulder Instructions      Home Living Family/patient expects to be discharged to:: Inpatient rehab Living Arrangements: Alone                               Additional Comments: Reports she lives in a very supportive community with a lot of friends and neighbors nearby.      Prior Functioning/Environment Prior Level of Function : Independent/Modified Independent;Driving             Mobility Comments: Pt  reports she was independent prior to admission with ambulation without an assistive device.  She reports she has a walker and a cane but would not want to use them unless absolutely necessary. ADLs Comments: Pt reports independence with self care, IADLs, driving, home management and grocery shopping.        OT Problem List: Decreased strength;Decreased activity tolerance;Decreased cognition;Decreased knowledge of use of DME or AE;Decreased range of motion;Impaired balance (sitting and/or standing);Decreased safety awareness      OT Treatment/Interventions: Self-care/ADL training;Balance training;Cognitive remediation/compensation;Therapeutic exercise;DME  and/or AE instruction;Therapeutic activities;Patient/family education    OT Goals(Current goals can be found in the care plan section) Acute Rehab OT Goals Patient Stated Goal: [place] OT Goal Formulation: With patient Time For Goal Achievement: 04/12/21 Potential to Achieve Goals: Fair ADL Goals Pt Will Perform Eating: with modified independence Pt Will Perform Upper Body Dressing: with modified independence Pt Will Perform Lower Body Dressing: with min assist Pt Will Transfer to Toilet: with min assist  OT Frequency: Min 2X/week    Co-evaluation              AM-PAC OT "6 Clicks" Daily Activity     Outcome Measure Help from another person eating meals?: A Little Help from another person taking care of personal grooming?: A Little Help from  another person toileting, which includes using toliet, bedpan, or urinal?: A Lot Help from another person bathing (including washing, rinsing, drying)?: A Lot Help from another person to put on and taking off regular upper body clothing?: A Little Help from another person to put on and taking off regular lower body clothing?: A Lot 6 Click Score: 15   End of Session    Activity Tolerance: Patient limited by lethargy Patient left: in bed;with call bell/phone within reach;with bed alarm set;with family/visitor present  OT Visit Diagnosis: Muscle weakness (generalized) (M62.81);Unsteadiness on feet (R26.81)                Time: 1423-9532 OT Time Calculation (min): 28 min Charges:  OT General Charges $OT Visit: 1 Visit OT Evaluation $OT Eval Low Complexity: 1 Low  Arlethia Basso T Kain Milosevic, OTR/L, CLT Atia Haupt 03/29/2021, 5:20 PM

## 2021-03-29 NOTE — Progress Notes (Signed)
55- RN came to bedside per PT request. Patient pocketing food on the right side of her mouth and very slow to respond to questions. RN reached out to Dr. Roosevelt Locks and performed NIH scale. Patient with some movement of right leg against gravity and having mild inattention.  1100- Dr. Roosevelt Locks at bedside.  Dr Roosevelt Locks reached out to neurology and CT was ordered stat. Based on assessment, appears to be medication induced, but will continue to monitor patient.

## 2021-03-29 NOTE — Progress Notes (Signed)
PT Cancellation Note  Patient Details Name: Chelsea Hart MRN: [id number] DOB: 08-Apr-1933   Cancelled Treatment:    Reason Eval/Treat Not Completed: Medical issues which prohibited therapy.  Upon arrival to room, patient finishing breakfast.  Noted with significant pocketing to R side of mouth; max/dep assist from therapist to clear mouth.  Generally lethargic with significant delay in responsiveness; question some sensory/propriceptive deficit to R UE (noted mashed in breakfast foods without awareness upon arrival to room).  Able to voice name, DOB and location; however, significant difficulty maintaining alertness to questions/task at hand.  BP taken and WFL (112/60s), SaO2 86-87%. Reapplied supplemental O2 at 2L for recovery to >91%. RN/MD immediately informed of patient status and at bedside for assessment.  Will hold additional PT intervention at this time and re-attempt at later time/date as medically appropriate.  Shogo Larkey H. Owens Shark, PT, DPT, NCS 03/29/21, 11:18 AM [phone]

## 2021-03-29 NOTE — Consult Note (Addendum)
NEURO HOSPITALIST CONSULT NOTE   Requestig physician: Dr. Roosevelt Locks  Reason for Consult: Possible stroke  History obtained from:  Chart     HPI:                                                                                                                                          Chelsea Hart is an 88 y.o. female with a history of hypercholesterolemia, HTN, CKD3, CAD, chronic bronchitis, smoking, anxiety, depression and uterine cancer s/p hysterectomy who presented to the ED on Thursday afternoon after a fall preceded by a 3 month history of weakness. BP was 180/88 in the ED, with CBG of 109. Imaging in the ED revealed an impacted left femoral neck fracture. Orthopedics was consulted and she underwent cannulated left hip pinning on Friday.   This AM, OT noted that the patient was with decreased alertness and was pocketing food on the right. The patient also seemed to have decreased awareness during the OT evaluation. CT head was obtained, revealing no acute finding in the context of extensive chronic ischemic injury. After CT was obtained, sister was interviewed, revealing that the right sided pocketing had been ongoing for the past 1-2 years. Sister also felt that the patient's mentation was at her baseline.   Neurology was consulted to evaluate for possible stroke.    Past Medical History:  Diagnosis Date   Anxiety    Depression    Hypercholesterolemia    Knee pain    Uterine cancer ([place])     Past Surgical History:  Procedure Laterality Date   ABDOMINAL HYSTERECTOMY     BREAST SURGERY     MIDDLE EAR SURGERY     NOSE SURGERY      History reviewed. No pertinent family history.          Social History:  reports that she has been smoking cigarettes. She has been smoking an average of 1 pack per day. She does not have any smokeless tobacco history on file. She reports that she does not drink alcohol and does not use drugs.  Allergies  Allergen Reactions    Penicillins     MEDICATIONS:  Prior to Admission:  Medications Prior to Admission  Medication Sig Dispense Refill Last Dose   ALPRAZolam (XANAX) 0.5 MG tablet Take 0.25 mg by mouth at bedtime as needed for sleep.   Unknown at PRN   ascorbic acid (VITAMIN C) 500 MG tablet Take 500 mg by mouth daily.      aspirin 81 MG EC tablet Take 81 mg by mouth daily.      calcium carbonate (OSCAL) 1500 (600 Ca) MG TABS tablet Take 1 tablet by mouth daily.      Cholecalciferol 50 MCG (2000 UT) TABS Take 2,000 Units by mouth daily.      diphenhydrAMINE (BENADRYL) 25 MG tablet Take 25 mg by mouth at bedtime as needed for sleep.   Unknown at PRN   fenofibrate 54 MG tablet Take 54 mg by mouth daily.   03/27/2021 at Unknown   latanoprost (XALATAN) 0.005 % ophthalmic solution Place 1 drop into both eyes at bedtime.   03/26/2021 at Unknown   loratadine (CLARITIN) 10 MG tablet Take 10 mg by mouth daily as needed for allergies.   Unknown at PRN   metoprolol tartrate (LOPRESSOR) 25 MG tablet Take 25 mg by mouth 2 (two) times daily.   03/27/2021 at Unknown   PARoxetine (PAXIL) 40 MG tablet Take 40 mg by mouth daily.   03/27/2021 at Unknown   simvastatin (ZOCOR) 20 MG tablet Take 20 mg by mouth at bedtime.   03/26/2021 at Unknown   Scheduled:  ascorbic acid  500 mg Oral Daily   aspirin EC  81 mg Oral BID   calcium carbonate  1 tablet Oral Q breakfast   cholecalciferol  2,000 Units Oral Daily   docusate sodium  100 mg Oral BID   enoxaparin (LOVENOX) injection  40 mg Subcutaneous Q24H   feeding supplement  237 mL Oral BID BM   fenofibrate  54 mg Oral Daily   latanoprost  1 drop Both Eyes QHS   lidocaine  1 patch Transdermal Q24H   metoprolol tartrate  25 mg Oral BID   multivitamin with minerals  1 tablet Oral Daily   pantoprazole  40 mg Oral Daily   PARoxetine  40 mg Oral Daily   simvastatin  20 mg  Oral QHS   Continuous:   ceFAZolin (ANCEF) IV     methocarbamol (ROBAXIN) IV       ROS:                                                                                                                                       Unreliable due to poor orientation.    Blood pressure (!) 147/61, pulse 61, temperature 98.2 F (36.8 C), resp. rate 16, height 5\' 3"  (1.6 m), weight 70.3 kg, SpO2 99 %.   General Examination:  Physical Exam  HEENT-  [place]/AT  Lungs- Respirations unlabored Extremities- Warm and well perfused  Neurological Examination Mental Status: Awake and alert, with mildly decreased attention. Pleasant and attempts to cooperate. No agitation. Oriented to the state but not the city. Knows that she is in the hospital. States that it is "Sunday" and that the month is "February". Not oriented to the year. Speech is fluent with intact comprehension for basic questions and commands. Memory is poor and in this context insight is also impaired.  Cranial Nerves: II: Temporal visual fields grossly intact when tested individually, but with extinction on the left to DSS. PERRL.   III,IV, VI: No ptosis. EOMI. No nystagmus.  V: Temp sensation intact bilaterally VII: Subtly decreased NL fold on the left but face contracts equally when smiling.  VIII: Hearing intact to voice IX,X: No hoarseness or hypophonia XI: Symmetric XII: Midline tongue extension Motor: RUE and LUE 5/5 proximally and distally without asymmetry.  BLE with 5/5 and symmetric ADF and APF; testing at ankle joint only due to recent hip injury and post-op status.  Sensory:  FT sensation equal in BUE and BLE.   Deep Tendon Reflexes: 2+ and symmetric throughout Cerebellar: No ataxia with FNF bilaterally, but slow.  Gait: Deferred   Lab Results: Basic Metabolic Panel: Recent Labs  Lab 03/27/21 1510 03/28/21 0558  03/29/21 0444  NA 137 137 140  K 3.6 3.5 4.2  CL 103 104 106  CO2 25 24 26   GLUCOSE 108* 121* 129*  BUN 17 20 23   CREATININE 1.13* 1.35* 1.14*  CALCIUM 10.1 9.4 8.9  MG  --   --  1.8    CBC: Recent Labs  Lab 03/27/21 1510 03/28/21 0558 03/29/21 0444  WBC 9.6 10.3 10.4  NEUTROABS  --  6.4 6.8  HGB 11.5* 10.5* 9.6*  HCT 34.4* 32.1* 29.5*  MCV 95.0 98.5 98.3  PLT 278 260 241    Cardiac Enzymes: Recent Labs  Lab 03/27/21 1510  CKTOTAL 106    Lipid Panel: No results for input(s): CHOL, TRIG, HDL, CHOLHDL, VLDL, LDLCALC in the last 168 hours.  Imaging: CT HEAD WO CONTRAST (5MM)  Result Date: 03/29/2021 CLINICAL DATA:  Neuro deficit with acute stroke suspected EXAM: CT HEAD WITHOUT CONTRAST TECHNIQUE: Contiguous axial images were obtained from the base of the skull through the vertex without intravenous contrast. RADIATION DOSE REDUCTION: This exam was performed according to the departmental dose-optimization program which includes automated exposure control, adjustment of the mA and/or kV according to patient size and/or use of iterative reconstruction technique. COMPARISON:  Head CT 03/27/2021 FINDINGS: Brain: No evidence of acute infarction, hemorrhage, hydrocephalus, extra-axial collection or mass lesion/mass effect. Chronic small vessel ischemic gliosis. Chronic small-vessel infarcts at the right frontal operculum/upper insula and right cerebellum. Chronic lacunes at the deep gray nuclei. Vascular: No hyperdense vessel or unexpected calcification. Skull: Normal. Negative for fracture or focal lesion. Sinuses/Orbits: No acute finding. IMPRESSION: 1. No acute finding. 2. Extensive chronic ischemic injury. Electronically Signed   By: Jorje Guild M.D.   On: 03/29/2021 11:48   DG C-Arm 1-60 Min-No Report  Result Date: 03/28/2021 Fluoroscopy was utilized by the requesting physician.  No radiographic interpretation.   DG HIP UNILAT WITH PELVIS 2-3 VIEWS LEFT  Result Date:  03/28/2021 CLINICAL DATA:  Fracture neck of left femur, fluoroscopic assistance for internal fixation EXAM: DG HIP (WITH OR WITHOUT PELVIS) 2-3V LEFT COMPARISON:  03/27/2021 FINDINGS: Fluoroscopic images show 3 surgical screws transfixing the subcapital fracture  of left femur. Fluoroscopic time was 22 seconds. Radiation dose is 4.1 mGy. IMPRESSION: Fluoroscopic assistance was provided for internal fixation of fracture of neck of left femur. Electronically Signed   By: Elmer Picker M.D.   On: 03/28/2021 20:02     Assessment: 88 year old female with perceived acute onset of possible new mild neurological deficits this AM, as described in detail in the HPI 1. Exam reveals extinction in left visual field to DSS and subtly decreased prominence of left nasolabial fold.  2. CT head: No acute finding. Extensive chronic ischemic changes. 3. DDx for her deficits noted this AM include sedating effects of pain medications, small acute stroke and possible pre-existing deficits (as suggested by family) which may have mistakenly been identified as acute by staff.    Recommendations: 1. MRI brain if there are no surgical or medical contraindications. If negative for acute abnormality, then no further neurological work up is necessary.  2. Minimize sedation if possible.    Electronically signed: Dr. Kerney Elbe 03/29/2021, 8:06 PM

## 2021-03-29 NOTE — Progress Notes (Signed)
°  Subjective: 1 Day Post-Op Procedure(s) (LRB): CANNULATED HIP PINNING (Left) Patient reports pain as moderate.   Patient is well, and has had no acute complaints or problems Plan is to go Rehab after hospital stay. Negative for chest pain and shortness of breath Fever: no Gastrointestinal: Negative for nausea and vomiting  Objective: Vital signs in last 24 hours: Temp:  [97.4 F (36.3 C)-98.6 F (37 C)] 98.3 F (36.8 C) (01/28 0310) Pulse Rate:  [58-92] 92 (01/28 0310) Resp:  [10-20] 20 (01/28 0310) BP: (106-159)/(50-75) 159/75 (01/28 0310) SpO2:  [92 %-100 %] 99 % (01/28 0310)  Intake/Output from previous day:  Intake/Output Summary (Last 24 hours) at 03/29/2021 0722 Last data filed at 03/29/2021 2119 Gross per 24 hour  Intake 750 ml  Output 250 ml  Net 500 ml    Intake/Output this shift: No intake/output data recorded.  Labs: Recent Labs    03/27/21 1510 03/28/21 0558 03/29/21 0444  HGB 11.5* 10.5* 9.6*   Recent Labs    03/28/21 0558 03/29/21 0444  WBC 10.3 10.4  RBC 3.26* 3.00*  HCT 32.1* 29.5*  PLT 260 241   Recent Labs    03/28/21 0558 03/29/21 0444  NA 137 140  K 3.5 4.2  CL 104 106  CO2 24 26  BUN 20 23  CREATININE 1.35* 1.14*  GLUCOSE 121* 129*  CALCIUM 9.4 8.9   No results for input(s): LABPT, INR in the last 72 hours.   EXAM General - Patient is Alert and mildly confused Extremity - Sensation intact distally Dorsiflexion/Plantar flexion intact Compartment soft Dressing/Incision - clean, dry, no drainage Motor Function - intact, moving foot and toes well on exam.   Past Medical History:  Diagnosis Date   Anxiety    Depression    Hypercholesterolemia    Knee pain    Uterine cancer ([facility])     Assessment/Plan: 1 Day Post-Op Procedure(s) (LRB): CANNULATED HIP PINNING (Left) Principal Problem:   Closed left hip fracture ([facility]) Active Problems:   CKD (chronic kidney disease) stage 3, GFR 30-59 ml/min ([facility])   Essential  hypertension   Coronary artery disease due to lipid rich plaque   Simple chronic bronchitis ([facility])   Smoker   DNR (do not resuscitate)/DNI(Do Not Intubate)  Estimated body mass index is 27.46 kg/m as calculated from the following:   Height as of this encounter: 5\' 3"  (1.6 m).   Weight as of this encounter: 70.3 kg. Advance diet Up with therapy  Discharge planning to rehab.  DVT Prophylaxis - Lovenox and compression hose Weight-Bearing as tolerated to left leg  Reche Dixon, PA-C Orthopaedic Surgery 03/29/2021, 7:22 AM

## 2021-03-29 NOTE — Evaluation (Signed)
Physical Therapy Evaluation Patient Details Name: Chelsea Hart MRN: [id number] DOB: March 12, 1933 Today's Date: 03/29/2021  History of Present Illness  presented ot ER secondary to fall (x2) with L hip pain; admitted for management of L femoral neck fracture with slight impaction, s/p cannulated hip pinning (03/28/21), WBAT.  Of note, patient resides in [place]; had driven to area to visit son.  Clinical Impression  Patient resting in bed upon arrival to room, sister present at bedside awaiting therapy session.  Patient appears more alert than noted in morning session, but still with marked delay to processing, responsiveness and general task initiation. Oriented to self, location and general situation; does follow very simple commands, but often requires assist for task initiation. Conversational and interactive in speech, but does seem to have some degree of expressive word-finding difficulties at times.  Often loses attention/focus to task or conversation at hand (even mid-task or mid-sentence).  Sister very involved throughout session and insistent that patient is "slow and takes her time" at baseline, staff just need to "be patient"; quick to offer explanations to therapist questions/observations during session. Unclear how much of current cognitive state of level of alertness may be new; will confirm/verify with daughter next date as able. Globally weak and deconditioned throughout all extremities, LEs > UEs, requiring act assist from therapist for movement throughout all planes.  Currently requiring max assist +1-2 for bed mobility, mod/max assist (progressing to periods of min assist) for unsupported sitting balance.  Demonstrates consistent R posterior/lateral lean with transition to upright, absent attempts at spontaneous correction.  Difficulty placing and maintaining bilat LEs under BOS for active, closed-chain WBing (L hip pain, R knee pain); therefore, standing and OOB attempts deferred at this  time.  Will continue to assess/progress next session as medically appropriate. Would benefit from skilled PT to address above deficits and promote optimal return to PLOF.; recommend transition to STR upon discharge from acute hospitalization.      Recommendations for follow up therapy are one component of a multi-disciplinary discharge planning process, led by the attending physician.  Recommendations may be updated based on patient status, additional functional criteria and insurance authorization.  Follow Up Recommendations Skilled nursing-short term rehab (<3 hours/day)    Assistance Recommended at Discharge Frequent or constant Supervision/Assistance  Patient can return home with the following  Two people to help with walking and/or transfers;Two people to help with bathing/dressing/bathroom;Assistance with cooking/housework;Direct supervision/assist for medications management;Assist for transportation;Direct supervision/assist for financial management;Assistance with feeding;Help with stairs or ramp for entrance    Equipment Recommendations Rolling walker (2 wheels);BSC/3in1  Recommendations for Other Services       Functional Status Assessment Patient has had a recent decline in their functional status and demonstrates the ability to make significant improvements in function in a reasonable and predictable amount of time.     Precautions / Restrictions Precautions Precautions: Fall Restrictions Weight Bearing Restrictions: Yes LLE Weight Bearing: Weight bearing as tolerated      Mobility  Bed Mobility Overal bed mobility: Needs Assistance Bed Mobility: Supine to Sit, Sit to Supine     Supine to sit: Max assist Sit to supine: Max assist, +2 for physical assistance   General bed mobility comments: hand-over-hand to initiate and complete movement transitions    Transfers                   General transfer comment: deferred due to poor sitting balance     Ambulation/Gait  General Gait Details: deferred due to poor sitting balance  Stairs            Wheelchair Mobility    Modified Rankin (Stroke Patients Only)       Balance Overall balance assessment: Needs assistance Sitting-balance support: No upper extremity supported, Feet supported Sitting balance-Leahy Scale: Poor Sitting balance - Comments: Initial mod/max assist to correct R post/lateral lean; constant cuing/assist for correction. Unable to fully position LEs under BOS due to L hip pain, R knee pain (chronic arthritic changes?)                                     Pertinent Vitals/Pain Pain Assessment Pain Assessment: Faces Faces Pain Scale: Hurts little more Pain Location: L hip Pain Descriptors / Indicators: Aching, Grimacing Pain Intervention(s): Limited activity within patient's tolerance, Monitored during session, Repositioned    Home Living Family/patient expects to be discharged to:: Skilled nursing facility Living Arrangements: Alone   Type of Home: House Home Access: Level entry       Home Layout: One level Home Equipment: Conservation officer, nature (2 wheels) Additional Comments: Per patient/sister, has RW but does not use. Has multiple friends in hometown that check in/support as needed.    Prior Function Prior Level of Function : Independent/Modified Independent;Driving             Mobility Comments: Pt  reports she was independent prior to admission with ambulation without an assistive device.  She reports she has a walker and a cane but would not want to use them unless absolutely necessary. ADLs Comments: Pt reports independence with self care, IADLs, driving, home management and grocery shopping.     Hand Dominance   Dominant Hand: Right    Extremity/Trunk Assessment   Upper Extremity Assessment Upper Extremity Assessment: Generalized weakness (generally slow and effortful in movement bilat; question some  degree of inattention/proprioptive deficit to R UE)    Lower Extremity Assessment Lower Extremity Assessment: Generalized weakness (generally slow and effortful in all movement, grossly 2+/5 throughout, requiring act assist for movement all joints, all planes)       Communication   Communication:  (Speech generally slow and effortful at times; questionable word-finding difficulties in conversation)  Cognition Arousal/Alertness: Awake/alert Behavior During Therapy:  (delayed responsiveness) Overall Cognitive Status: Difficult to assess                                 General Comments: Difficult to fully assess patient's cognitive status, she appeared to have slow processing skills, decreased recall of events, occasional difficulty with word finding during conversational speech.  She had her eyes closed often during session and appeared to have decreased awareness at times with body position in space.        General Comments      Exercises Other Exercises Other Exercises: L LE supine therex, 1x10, act assist ROM: ankle pumps, heel slides, hip abduct/adduct.  Movement generally limited by pain; does require act assit from therapist to initiate and complete all mobility Other Exercises: Unsupported sitting edge of bed, worked to promote awareness of and correction of posture to midline. Mod/max assist initially for R posterior/lateral lean; partially improves to min/close sup with dynamic reaching/weight shifting activities.  Unable to spontaneously correct posture to midline without constant and direct cuing/assist from therapist.   Assessment/Plan  PT Assessment Patient needs continued PT services  PT Problem List Decreased strength;Decreased range of motion;Decreased activity tolerance;Decreased knowledge of use of DME;Decreased balance;Decreased mobility;Decreased coordination;Decreased cognition;Decreased safety awareness;Decreased knowledge of precautions;Decreased  skin integrity;Pain       PT Treatment Interventions DME instruction;Gait training;Functional mobility training;Therapeutic activities;Therapeutic exercise;Balance training;Patient/family education;Cognitive remediation    PT Goals (Current goals can be found in the Care Plan section)  Acute Rehab PT Goals Patient Stated Goal: to be as independent as I can be PT Goal Formulation: With patient Time For Goal Achievement: 04/12/21 Potential to Achieve Goals: Fair    Frequency 7X/week     Co-evaluation PT/OT/SLP Co-Evaluation/Treatment: Yes Reason for Co-Treatment: Complexity of the patient's impairments (multi-system involvement);For patient/therapist safety;To address functional/ADL transfers PT goals addressed during session: Mobility/safety with mobility;Balance OT goals addressed during session: ADL's and self-care       AM-PAC PT "6 Clicks" Mobility  Outcome Measure Help needed turning from your back to your side while in a flat bed without using bedrails?: Total Help needed moving from lying on your back to sitting on the side of a flat bed without using bedrails?: Total Help needed moving to and from a bed to a chair (including a wheelchair)?: Total Help needed standing up from a chair using your arms (e.g., wheelchair or bedside chair)?: Total Help needed to walk in hospital room?: Total Help needed climbing 3-5 steps with a railing? : Total 6 Click Score: 6    End of Session   Activity Tolerance: Patient tolerated treatment well Patient left: in bed;with call bell/phone within reach;with bed alarm set Nurse Communication: Mobility status PT Visit Diagnosis: Muscle weakness (generalized) (M62.81);Other abnormalities of gait and mobility (R26.89);Pain Pain - Right/Left: Left Pain - part of body: Hip    Time: 3354-5625 PT Time Calculation (min) (ACUTE ONLY): 40 min   Charges:   PT Evaluation $PT Eval Moderate Complexity: 1 Mod PT Treatments $Therapeutic  Activity: 8-22 mins      Jeraline Marcinek H. Owens Shark, PT, DPT, NCS 03/29/21, 6:07 PM [phone]

## 2021-03-29 NOTE — Progress Notes (Signed)
PROGRESS NOTE    Chelsea Hart  WGY:[id number] DOB: 08/04/1933 DOA: 03/27/2021 PCP: Pcp, No    Brief Narrative:  88 year old female who is from [facility] who is a here visiting her son.  She also dropped off a friend in [place].  Patient still driving.  She lives independently.  She states that she fell at her son's house yesterday morning.  She was able to get back up.  She states that her left leg gave out from her when she was walking.   X-ray showed a left hip fracture.   Assessment & Plan:   Principal Problem:   Closed left hip fracture (HCC) Active Problems:   CKD (chronic kidney disease) stage 3, GFR 30-59 ml/min (HCC)   Essential hypertension   Coronary artery disease due to lipid rich plaque   Simple chronic bronchitis (HCC)   Smoker   DNR (do not resuscitate)/DNI(Do Not Intubate)  Right facial droop. Patient develop right facial droop today, she also had minimal confusion.  There is suspected right sided weakness in the leg.  But was able to move her leg when I was there. Symptoms started 1 hour after taking oral pain medicine.  CT head did not show any acute changes,  it did show severe small vessel disease. Condition most likely due to pain medicine.  But I will obtain neurology consult to rule out a stroke.  Left hip fracture. Status post the surgery. Doing well, eating well, will discontinue IV fluids.  Chronic kidney disease stage IIIa. Condition stable.  Essential hypertension  Continue beta-blocker.  Coronary artery disease. Continue Lopressor and Zocor.  Chronic bronchitis Tobacco abuse. Advised to quit.   Neck pain. Secondary to arthritis.  CT scan negative.  Continue pain medicine and the Lidoderm patch.   DVT prophylaxis: SCds Code Status: DNR Family Communication: Sister updated at bedside. Disposition Plan:      Status is: Inpatient   Remains inpatient appropriate because: Severity of disease, unsafe discharge.   I/O  last 3 completed shifts: In: 750 [I.V.:600; IV Piggyback:150] Out: 450 [Urine:400; Blood:50] No intake/output data recorded.     Consultants:  Orthopedics, neuro  Procedures: hip  Antimicrobials: None  Subjective: Developed some confusion and a right facial droop.  Concerned about right leg weakness.  CT negative for stroke. Symptoms started 1 hour after giving pain medicine. Denies any short of breath or cough. No abdominal pain nausea vomiting.   Objective: Vitals:   03/29/21 0310 03/29/21 0832 03/29/21 1000 03/29/21 1105  BP: (!) 159/75 (!) 156/67  104/67  Pulse: 92 63  60  Resp: 20 16  10   Temp: 98.3 F (36.8 C) 97.9 F (36.6 C)  98.3 F (36.8 C)  TempSrc: Oral  Oral   SpO2: 99% 100%  93%  Weight:      Height:        Intake/Output Summary (Last 24 hours) at 03/29/2021 1238 Last data filed at 03/29/2021 7793 Gross per 24 hour  Intake 750 ml  Output 250 ml  Net 500 ml   Filed Weights   03/27/21 1508  Weight: 70.3 kg    Examination:  General exam: Appears calm and comfortable  Respiratory system: Clear to auscultation. Respiratory effort normal. Cardiovascular system: S1 & S2 heard, RRR. No JVD, murmurs, rubs, gallops or clicks. No pedal edema. Gastrointestinal system: Abdomen is nondistended, soft and nontender. No organomegaly or masses felt. Normal bowel sounds heard. Central nervous system: Alert and oriented.  Mild right facial droop  Extremities: Symmetric 5 x 5 power. Skin: No rashes, lesions or ulcers Psychiatry: Judgement and insight appear normal. Mood & affect appropriate.     Data Reviewed: I have personally reviewed following labs and imaging studies  CBC: Recent Labs  Lab 03/27/21 1510 03/28/21 0558 03/29/21 0444  WBC 9.6 10.3 10.4  NEUTROABS  --  6.4 6.8  HGB 11.5* 10.5* 9.6*  HCT 34.4* 32.1* 29.5*  MCV 95.0 98.5 98.3  PLT 278 260 782   Basic Metabolic Panel: Recent Labs  Lab 03/27/21 1510 03/28/21 0558 03/29/21 0444  NA  137 137 140  K 3.6 3.5 4.2  CL 103 104 106  CO2 25 24 26   GLUCOSE 108* 121* 129*  BUN 17 20 23   CREATININE 1.13* 1.35* 1.14*  CALCIUM 10.1 9.4 8.9  MG  --   --  1.8   GFR: Estimated Creatinine Clearance: 33.9 mL/min (A) (by C-G formula based on SCr of 1.14 mg/dL (H)). Liver Function Tests: Recent Labs  Lab 03/28/21 0558  AST 19  ALT 15  ALKPHOS 64  BILITOT 0.5  PROT 6.5  ALBUMIN 3.3*   No results for input(s): LIPASE, AMYLASE in the last 168 hours. No results for input(s): AMMONIA in the last 168 hours. Coagulation Profile: No results for input(s): INR, PROTIME in the last 168 hours. Cardiac Enzymes: Recent Labs  Lab 03/27/21 1510  CKTOTAL 106   BNP (last 3 results) No results for input(s): PROBNP in the last 8760 hours. HbA1C: No results for input(s): HGBA1C in the last 72 hours. CBG: No results for input(s): GLUCAP in the last 168 hours. Lipid Profile: No results for input(s): CHOL, HDL, LDLCALC, TRIG, CHOLHDL, LDLDIRECT in the last 72 hours. Thyroid Function Tests: No results for input(s): TSH, T4TOTAL, FREET4, T3FREE, THYROIDAB in the last 72 hours. Anemia Panel: No results for input(s): VITAMINB12, FOLATE, FERRITIN, TIBC, IRON, RETICCTPCT in the last 72 hours. Sepsis Labs: No results for input(s): PROCALCITON, LATICACIDVEN in the last 168 hours.  Recent Results (from the past 240 hour(s))  Resp Panel by RT-PCR (Flu A&B, Covid) Nasopharyngeal Swab     Status: None   Collection Time: 03/27/21  6:54 PM   Specimen: Nasopharyngeal Swab; Nasopharyngeal(NP) swabs in vial transport medium  Result Value Ref Range Status   SARS Coronavirus 2 by RT PCR NEGATIVE NEGATIVE Final    Comment: (NOTE) SARS-CoV-2 target nucleic acids are NOT DETECTED.  The SARS-CoV-2 RNA is generally detectable in upper respiratory specimens during the acute phase of infection. The lowest concentration of SARS-CoV-2 viral copies this assay can detect is 138 copies/mL. A negative result  does not preclude SARS-Cov-2 infection and should not be used as the sole basis for treatment or other patient management decisions. A negative result may occur with  improper specimen collection/handling, submission of specimen other than nasopharyngeal swab, presence of viral mutation(s) within the areas targeted by this assay, and inadequate number of viral copies(<138 copies/mL). A negative result must be combined with clinical observations, patient history, and epidemiological information. The expected result is Negative.  Fact Sheet for Patients:  EntrepreneurPulse.com.au  Fact Sheet for Healthcare Providers:  IncredibleEmployment.be  This test is no t yet approved or cleared by the [place] FDA and  has been authorized for detection and/or diagnosis of SARS-CoV-2 by FDA under an Emergency Use Authorization (EUA). This EUA will remain  in effect (meaning this test can be used) for the duration of the COVID-19 declaration under Section 564(b)(1) of the Act, 21 U.S.C.section  360bbb-3(b)(1), unless the authorization is terminated  or revoked sooner.       Influenza A by PCR NEGATIVE NEGATIVE Final   Influenza B by PCR NEGATIVE NEGATIVE Final    Comment: (NOTE) The Xpert Xpress SARS-CoV-2/FLU/RSV plus assay is intended as an aid in the diagnosis of influenza from Nasopharyngeal swab specimens and should not be used as a sole basis for treatment. Nasal washings and aspirates are unacceptable for Xpert Xpress SARS-CoV-2/FLU/RSV testing.  Fact Sheet for Patients: EntrepreneurPulse.com.au  Fact Sheet for Healthcare Providers: IncredibleEmployment.be  This test is not yet approved or cleared by the [place] FDA and has been authorized for detection and/or diagnosis of SARS-CoV-2 by FDA under an Emergency Use Authorization (EUA). This EUA will remain in effect (meaning this test can be used) for  the duration of the COVID-19 declaration under Section 564(b)(1) of the Act, 21 U.S.C. section 360bbb-3(b)(1), unless the authorization is terminated or revoked.  Performed at [facility], [address], [place], [place] [address]   Surgical PCR screen     Status: None   Collection Time: 03/28/21  4:04 AM   Specimen: Nasal Mucosa; Nasal Swab  Result Value Ref Range Status   MRSA, PCR NEGATIVE NEGATIVE Final   Staphylococcus aureus NEGATIVE NEGATIVE Final    Comment: (NOTE) The Xpert SA Assay (FDA approved for NASAL specimens in patients 73 years of age and older), is one component of a comprehensive surveillance program. It is not intended to diagnose infection nor to guide or monitor treatment. Performed at [facility], [address], [place], [place] [address]          Radiology Studies: DG Chest 1 View  Result Date: 03/27/2021 CLINICAL DATA:  Weakness and fall.  Question infiltrate. EXAM: CHEST  1 VIEW COMPARISON:  04/14/2005 FINDINGS: Artifact overlies the chest. Heart size is normal. Mild aortic atherosclerotic tortuosity and calcification. The right chest is clear and normal. There is mild elevation of the left hemidiaphragm, with mild crowding of the markings at the lung base because of that, but the lung appears otherwise clear. No effusion. IMPRESSION: No acute or traumatic finding. Mild elevation of the left hemidiaphragm with mild crowding of the markings at the left base because of that. Electronically Signed   By: Nelson Chimes M.D.   On: 03/27/2021 16:13   CT HEAD WO CONTRAST (5MM)  Result Date: 03/29/2021 CLINICAL DATA:  Neuro deficit with acute stroke suspected EXAM: CT HEAD WITHOUT CONTRAST TECHNIQUE: Contiguous axial images were obtained from the base of the skull through the vertex without intravenous contrast. RADIATION DOSE REDUCTION: This exam was performed according to the departmental dose-optimization program which includes automated  exposure control, adjustment of the mA and/or kV according to patient size and/or use of iterative reconstruction technique. COMPARISON:  Head CT 03/27/2021 FINDINGS: Brain: No evidence of acute infarction, hemorrhage, hydrocephalus, extra-axial collection or mass lesion/mass effect. Chronic small vessel ischemic gliosis. Chronic small-vessel infarcts at the right frontal operculum/upper insula and right cerebellum. Chronic lacunes at the deep gray nuclei. Vascular: No hyperdense vessel or unexpected calcification. Skull: Normal. Negative for fracture or focal lesion. Sinuses/Orbits: No acute finding. IMPRESSION: 1. No acute finding. 2. Extensive chronic ischemic injury. Electronically Signed   By: Jorje Guild M.D.   On: 03/29/2021 11:48   CT HEAD WO CONTRAST (5MM)  Result Date: 03/27/2021 CLINICAL DATA:  Head trauma, minor (Age >= 65y) EXAM: CT HEAD WITHOUT CONTRAST TECHNIQUE: Contiguous axial images were obtained from the base  of the skull through the vertex without intravenous contrast. RADIATION DOSE REDUCTION: This exam was performed according to the departmental dose-optimization program which includes automated exposure control, adjustment of the mA and/or kV according to patient size and/or use of iterative reconstruction technique. COMPARISON:  None. FINDINGS: Brain: No evidence of acute infarction, hemorrhage, hydrocephalus, extra-axial collection or mass lesion/mass effect. Focal area of encephalomalacia in the right frontal lobe compatible with remote ischemia. Multiple prior lacunar infarcts in the bilateral basal ganglia. Extensive low-density changes within the periventricular and subcortical white matter compatible with chronic microvascular ischemic change. Mild diffuse cerebral volume loss. Vascular: Atherosclerotic calcifications involving the large vessels of the skull base. No unexpected hyperdense vessel. Skull: Normal. Negative for fracture or focal lesion. Sinuses/Orbits: No acute  finding. Other: None. IMPRESSION: 1. No acute intracranial abnormality. 2. Extensive chronic microvascular ischemic changes and cerebral volume loss. Electronically Signed   By: Davina Poke D.O.   On: 03/27/2021 16:40   CT Cervical Spine Wo Contrast  Result Date: 03/27/2021 CLINICAL DATA:  Neck trauma EXAM: CT CERVICAL SPINE WITHOUT CONTRAST TECHNIQUE: Multidetector CT imaging of the cervical spine was performed without intravenous contrast. Multiplanar CT image reconstructions were also generated. RADIATION DOSE REDUCTION: This exam was performed according to the departmental dose-optimization program which includes automated exposure control, adjustment of the mA and/or kV according to patient size and/or use of iterative reconstruction technique. COMPARISON:  None. FINDINGS: Alignment: Grade 1 anterolisthesis of C3 on C4 and C7 on T1. No evidence of acute subluxation. Skull base and vertebrae: No acute fracture. No primary bone lesion or focal pathologic process. Soft tissues and spinal canal: No prevertebral fluid or swelling. No visible canal hematoma. Disc levels: Moderate to severe intervertebral disc space narrowing from C4 through C7. Associated endplate sclerosis and dorsal endplate osteophytes most prominent at C5-C6. Bilateral facet arthropathy. Mild neural foraminal narrowing at C5-C6 on the right. Upper chest: Heterogeneous thyroid gland with likely nodules on the right measuring up to 1.5 cm. Mild biapical pleural thickening. Other: None. IMPRESSION: 1. No acute fracture or subluxation identified. Degenerative changes of the cervical spine. 2. Heterogeneous nodular thyroid gland, consider follow-up ultrasound. Electronically Signed   By: Ofilia Neas M.D.   On: 03/27/2021 16:37   DG Knee Complete 4 Views Left  Result Date: 03/27/2021 CLINICAL DATA:  [place] yesterday.  Knee pain. EXAM: LEFT KNEE - COMPLETE 4+ VIEW COMPARISON:  None. FINDINGS: No joint effusion. No fracture. Chronic  appearing joint space narrowing with chondrocalcinosis, typical of age. No focal lesion. IMPRESSION: No acute or traumatic finding. Joint space narrowing and chondrocalcinosis. Electronically Signed   By: Nelson Chimes M.D.   On: 03/27/2021 16:11   DG C-Arm 1-60 Min-No Report  Result Date: 03/28/2021 Fluoroscopy was utilized by the requesting physician.  No radiographic interpretation.   DG HIP UNILAT WITH PELVIS 2-3 VIEWS LEFT  Result Date: 03/28/2021 CLINICAL DATA:  Fracture neck of left femur, fluoroscopic assistance for internal fixation EXAM: DG HIP (WITH OR WITHOUT PELVIS) 2-3V LEFT COMPARISON:  03/27/2021 FINDINGS: Fluoroscopic images show 3 surgical screws transfixing the subcapital fracture of left femur. Fluoroscopic time was 22 seconds. Radiation dose is 4.1 mGy. IMPRESSION: Fluoroscopic assistance was provided for internal fixation of fracture of neck of left femur. Electronically Signed   By: Elmer Picker M.D.   On: 03/28/2021 20:02   DG Hip Unilat W or Wo Pelvis 2-3 Views Left  Result Date: 03/27/2021 CLINICAL DATA:  [place] yesterday with left hip pain. EXAM: DG  HIP (WITH OR WITHOUT PELVIS) 2-3V LEFT COMPARISON:  None. FINDINGS: There is a fracture of the left femoral neck with slight impaction. No intertrochanteric component. The bones of the pelvis are negative. IMPRESSION: Left femoral neck fracture with minimal impaction. Electronically Signed   By: Nelson Chimes M.D.   On: 03/27/2021 16:10        Scheduled Meds:  ascorbic acid  500 mg Oral Daily   aspirin EC  81 mg Oral BID   calcium carbonate  1 tablet Oral Q breakfast   cholecalciferol  2,000 Units Oral Daily   docusate sodium  100 mg Oral BID   enoxaparin (LOVENOX) injection  40 mg Subcutaneous Q24H   feeding supplement  237 mL Oral BID BM   fenofibrate  54 mg Oral Daily   latanoprost  1 drop Both Eyes QHS   lidocaine  1 patch Transdermal Q24H   metoprolol tartrate  25 mg Oral BID   multivitamin with minerals  1  tablet Oral Daily   pantoprazole  40 mg Oral Daily   PARoxetine  40 mg Oral Daily   simvastatin  20 mg Oral QHS   Continuous Infusions:   ceFAZolin (ANCEF) IV     methocarbamol (ROBAXIN) IV       LOS: 2 days    Time spent: 34 minutes    Sharen Hones, MD [facility]   To contact the attending provider between 7A-7P or the covering provider during after hours 7P-7A, please log into the web site [email or web address] and access using universal [place] password for that web site. If you do not have the password, please call the hospital operator.  03/29/2021, 12:38 PM

## 2021-03-29 NOTE — Progress Notes (Signed)
Spoke to patient's sister, Vaughan Basta, who was at bedside. Vaughan Basta states that patient has a history of pocketing food on the right side for the last year or two. Vaughan Basta states that patient appears to be at baseline, but looks slightly drowsy. Will update Dr. Roosevelt Locks.

## 2021-03-29 NOTE — Progress Notes (Signed)
OT Cancellation Note  Patient Details Name: Chelsea Hart MRN: [id number] DOB: [birth year]   Cancelled Treatment:     OT ordered received, spoke with PT who stated patient demonstrated decreased alertness this date, pocketing of food, and decreased awareness.  MD to follow up with patient and determine need for further testing.  Will hold OT intervention at this time and re-attempt at later time/date as medically appropriate. Dessiree Sze T Sayana Salley, OTR/L, CLT   Rital Cavey 03/29/2021, 12:00 PM

## 2021-03-30 ENCOUNTER — Inpatient Hospital Stay: Payer: Medicare Other

## 2021-03-30 MED ORDER — LACTATED RINGERS IV SOLN: INTRAVENOUS | Status: AC

## 2021-03-30 NOTE — Progress Notes (Signed)
PROGRESS NOTE    Chelsea Hart  ACZ:[id number] DOB: 1933/06/07 DOA: 03/27/2021 PCP: Pcp, No    Brief Narrative:  88 year old female who is from [facility] who is a here visiting her son.  She also dropped off a friend in [place].  Patient still driving.  She lives independently.  She states that she fell at her son's house yesterday morning.  She was able to get back up.  She states that her left leg gave out from her when she was walking.   X-ray showed a left hip fracture.  Hip pinning was performed on 1/27.   Assessment & Plan:   Principal Problem:   Closed left hip fracture (HCC) Active Problems:   CKD (chronic kidney disease) stage 3, GFR 30-59 ml/min (HCC)   Essential hypertension   Coronary artery disease due to lipid rich plaque   Simple chronic bronchitis (HCC)   Smoker   DNR (do not resuscitate)/DNI(Do Not Intubate)  Right facial droop. Most likely due to pain medicine.  Seen by neurology, CT scan negative for stroke or bleeding.  Neurology recommended MRI, will perform today.  Left hip fracture. Status post the surgery. Patient did not eat well yesterday.  We will give 700 mL of fluids.  Recheck level tomorrow.  Chronic kidney disease stage IIIa. Stable.  Essential hypertension  Continue beta-blocker.  Coronary artery disease. Continue Lopressor and Zocor.  Chronic bronchitis Tobacco abuse. Advised to quit.   Neck pain. Secondary to arthritis.  CT scan negative.  Continue pain medicine and the Lidoderm patch.   DVT prophylaxis: SCds Code Status: DNR Family Communication:  Disposition Plan: SNF     Status is: Inpatient   Remains inpatient appropriate because: Severity of disease, unsafe discharge.     I/O last 3 completed shifts: In: 940 [P.O.:240; I.V.:600; IV Piggyback:100] Out: 550 [Urine:500; Blood:50] No intake/output data recorded.     Consultants:  Ortho, Neurology  Procedures: Hip  Antimicrobials:  None  Subjective: Patient does not feel well today, feels tired, with some hip pain.  Appetite is poor. Denies any short of breath or cough. No fever or chills. No dysuria hematuria.  Objective: Vitals:   03/29/21 2229 03/30/21 0525 03/30/21 1001 03/30/21 1028  BP: (!) 147/75 139/69 (!) 148/86 (!) 152/78  Pulse: 69 62 (!) 112 99  Resp: 18 18 16 18   Temp: 99.6 F (37.6 C) 98.7 F (37.1 C) 99.6 F (37.6 C) 99.5 F (37.5 C)  TempSrc: Oral   Oral  SpO2: 100% 98% 94%   Weight:      Height:        Intake/Output Summary (Last 24 hours) at 03/30/2021 1053 Last data filed at 03/30/2021 0741 Gross per 24 hour  Intake 240 ml  Output 300 ml  Net -60 ml   Filed Weights   03/27/21 1508  Weight: 70.3 kg    Examination:  General exam: Appears calm and comfortable  Respiratory system: Clear to auscultation. Respiratory effort normal. Cardiovascular system: S1 & S2 heard, RRR. No JVD, murmurs, rubs, gallops or clicks. No pedal edema. Gastrointestinal system: Abdomen is nondistended, soft and nontender. No organomegaly or masses felt. Normal bowel sounds heard. Central nervous system: Alert and oriented x2. No focal neurological deficits. Extremities: Symmetric 5 x 5 power. Skin: No rashes, lesions or ulcers     Data Reviewed: I have personally reviewed following labs and imaging studies  CBC: Recent Labs  Lab 03/27/21 1510 03/28/21 0558 03/29/21 0444  WBC 9.6 10.3  10.4  NEUTROABS  --  6.4 6.8  HGB 11.5* 10.5* 9.6*  HCT 34.4* 32.1* 29.5*  MCV 95.0 98.5 98.3  PLT 278 260 330   Basic Metabolic Panel: Recent Labs  Lab 03/27/21 1510 03/28/21 0558 03/29/21 0444  NA 137 137 140  K 3.6 3.5 4.2  CL 103 104 106  CO2 25 24 26   GLUCOSE 108* 121* 129*  BUN 17 20 23   CREATININE 1.13* 1.35* 1.14*  CALCIUM 10.1 9.4 8.9  MG  --   --  1.8   GFR: Estimated Creatinine Clearance: 33.9 mL/min (A) (by C-G formula based on SCr of 1.14 mg/dL (H)). Liver Function Tests: Recent  Labs  Lab 03/28/21 0558  AST 19  ALT 15  ALKPHOS 64  BILITOT 0.5  PROT 6.5  ALBUMIN 3.3*   No results for input(s): LIPASE, AMYLASE in the last 168 hours. No results for input(s): AMMONIA in the last 168 hours. Coagulation Profile: No results for input(s): INR, PROTIME in the last 168 hours. Cardiac Enzymes: Recent Labs  Lab 03/27/21 1510  CKTOTAL 106   BNP (last 3 results) No results for input(s): PROBNP in the last 8760 hours. HbA1C: No results for input(s): HGBA1C in the last 72 hours. CBG: No results for input(s): GLUCAP in the last 168 hours. Lipid Profile: No results for input(s): CHOL, HDL, LDLCALC, TRIG, CHOLHDL, LDLDIRECT in the last 72 hours. Thyroid Function Tests: No results for input(s): TSH, T4TOTAL, FREET4, T3FREE, THYROIDAB in the last 72 hours. Anemia Panel: No results for input(s): VITAMINB12, FOLATE, FERRITIN, TIBC, IRON, RETICCTPCT in the last 72 hours. Sepsis Labs: No results for input(s): PROCALCITON, LATICACIDVEN in the last 168 hours.  Recent Results (from the past 240 hour(s))  Resp Panel by RT-PCR (Flu A&B, Covid) Nasopharyngeal Swab     Status: None   Collection Time: 03/27/21  6:54 PM   Specimen: Nasopharyngeal Swab; Nasopharyngeal(NP) swabs in vial transport medium  Result Value Ref Range Status   SARS Coronavirus 2 by RT PCR NEGATIVE NEGATIVE Final    Comment: (NOTE) SARS-CoV-2 target nucleic acids are NOT DETECTED.  The SARS-CoV-2 RNA is generally detectable in upper respiratory specimens during the acute phase of infection. The lowest concentration of SARS-CoV-2 viral copies this assay can detect is 138 copies/mL. A negative result does not preclude SARS-Cov-2 infection and should not be used as the sole basis for treatment or other patient management decisions. A negative result may occur with  improper specimen collection/handling, submission of specimen other than nasopharyngeal swab, presence of viral mutation(s) within the areas  targeted by this assay, and inadequate number of viral copies(<138 copies/mL). A negative result must be combined with clinical observations, patient history, and epidemiological information. The expected result is Negative.  Fact Sheet for Patients:  EntrepreneurPulse.com.au  Fact Sheet for Healthcare Providers:  IncredibleEmployment.be  This test is no t yet approved or cleared by the [place] FDA and  has been authorized for detection and/or diagnosis of SARS-CoV-2 by FDA under an Emergency Use Authorization (EUA). This EUA will remain  in effect (meaning this test can be used) for the duration of the COVID-19 declaration under Section 564(b)(1) of the Act, 21 U.S.C.section 360bbb-3(b)(1), unless the authorization is terminated  or revoked sooner.       Influenza A by PCR NEGATIVE NEGATIVE Final   Influenza B by PCR NEGATIVE NEGATIVE Final    Comment: (NOTE) The Xpert Xpress SARS-CoV-2/FLU/RSV plus assay is intended as an aid in the diagnosis of influenza  from Nasopharyngeal swab specimens and should not be used as a sole basis for treatment. Nasal washings and aspirates are unacceptable for Xpert Xpress SARS-CoV-2/FLU/RSV testing.  Fact Sheet for Patients: EntrepreneurPulse.com.au  Fact Sheet for Healthcare Providers: IncredibleEmployment.be  This test is not yet approved or cleared by the [place] FDA and has been authorized for detection and/or diagnosis of SARS-CoV-2 by FDA under an Emergency Use Authorization (EUA). This EUA will remain in effect (meaning this test can be used) for the duration of the COVID-19 declaration under Section 564(b)(1) of the Act, 21 U.S.C. section 360bbb-3(b)(1), unless the authorization is terminated or revoked.  Performed at [facility], [address] [place], [place] [address]   Surgical PCR screen     Status: None   Collection Time:  03/28/21  4:04 AM   Specimen: Nasal Mucosa; Nasal Swab  Result Value Ref Range Status   MRSA, PCR NEGATIVE NEGATIVE Final   Staphylococcus aureus NEGATIVE NEGATIVE Final    Comment: (NOTE) The Xpert SA Assay (FDA approved for NASAL specimens in patients 41 years of age and older), is one component of a comprehensive surveillance program. It is not intended to diagnose infection nor to guide or monitor treatment. Performed at [facility], [address], [place], [place] [address]          Radiology Studies: CT HEAD WO CONTRAST (5MM)  Result Date: 03/29/2021 CLINICAL DATA:  Neuro deficit with acute stroke suspected EXAM: CT HEAD WITHOUT CONTRAST TECHNIQUE: Contiguous axial images were obtained from the base of the skull through the vertex without intravenous contrast. RADIATION DOSE REDUCTION: This exam was performed according to the departmental dose-optimization program which includes automated exposure control, adjustment of the mA and/or kV according to patient size and/or use of iterative reconstruction technique. COMPARISON:  Head CT 03/27/2021 FINDINGS: Brain: No evidence of acute infarction, hemorrhage, hydrocephalus, extra-axial collection or mass lesion/mass effect. Chronic small vessel ischemic gliosis. Chronic small-vessel infarcts at the right frontal operculum/upper insula and right cerebellum. Chronic lacunes at the deep gray nuclei. Vascular: No hyperdense vessel or unexpected calcification. Skull: Normal. Negative for fracture or focal lesion. Sinuses/Orbits: No acute finding. IMPRESSION: 1. No acute finding. 2. Extensive chronic ischemic injury. Electronically Signed   By: Jorje Guild M.D.   On: 03/29/2021 11:48   DG C-Arm 1-60 Min-No Report  Result Date: 03/28/2021 Fluoroscopy was utilized by the requesting physician.  No radiographic interpretation.   DG HIP UNILAT WITH PELVIS 2-3 VIEWS LEFT  Result Date: 03/28/2021 CLINICAL DATA:  Fracture neck of left  femur, fluoroscopic assistance for internal fixation EXAM: DG HIP (WITH OR WITHOUT PELVIS) 2-3V LEFT COMPARISON:  03/27/2021 FINDINGS: Fluoroscopic images show 3 surgical screws transfixing the subcapital fracture of left femur. Fluoroscopic time was 22 seconds. Radiation dose is 4.1 mGy. IMPRESSION: Fluoroscopic assistance was provided for internal fixation of fracture of neck of left femur. Electronically Signed   By: Elmer Picker M.D.   On: 03/28/2021 20:02        Scheduled Meds:  ascorbic acid  500 mg Oral Daily   aspirin EC  81 mg Oral BID   calcium carbonate  1 tablet Oral Q breakfast   cholecalciferol  2,000 Units Oral Daily   docusate sodium  100 mg Oral BID   enoxaparin (LOVENOX) injection  40 mg Subcutaneous Q24H   feeding supplement  237 mL Oral BID BM   fenofibrate  54 mg Oral Daily   latanoprost  1 drop Both Eyes QHS  lidocaine  1 patch Transdermal Q24H   metoprolol tartrate  25 mg Oral BID   multivitamin with minerals  1 tablet Oral Daily   pantoprazole  40 mg Oral Daily   PARoxetine  40 mg Oral Daily   simvastatin  20 mg Oral QHS   Continuous Infusions:   ceFAZolin (ANCEF) IV     lactated ringers     methocarbamol (ROBAXIN) IV       LOS: 3 days    Time spent: 28 minutes    Sharen Hones, MD [facility]   To contact the attending provider between 7A-7P or the covering provider during after hours 7P-7A, please log into the web site [email or web address] and access using universal [place] password for that web site. If you do not have the password, please call the hospital operator.  03/30/2021, 10:53 AM

## 2021-03-30 NOTE — Progress Notes (Signed)
°   03/30/21 1028  Assess: MEWS Score  Temp 99.5 F (37.5 C)  BP (!) 152/78  Pulse Rate 99  Resp 18  Level of Consciousness Alert  Assess: MEWS Score  MEWS Temp 0  MEWS Systolic 0  MEWS Pulse 0  MEWS RR 0  MEWS LOC 0  MEWS Score 0  MEWS Score Color Green  Assess: if the MEWS score is Yellow or Red  Were vital signs taken at a resting state? Yes  Focused Assessment No change from prior assessment  Does the patient meet 2 or more of the SIRS criteria? No  MEWS guidelines implemented *See Row Information* No, vital signs rechecked  Document  Patient Outcome Other (Comment) (stable, no interventions)  Progress note created (see row info) Yes  Assess: SIRS CRITERIA  SIRS Temperature  0  SIRS Pulse 1  SIRS Respirations  0  SIRS WBC 0  SIRS Score Sum  1

## 2021-03-30 NOTE — Progress Notes (Signed)
°  Subjective: 2 Days Post-Op Procedure(s) (LRB): CANNULATED HIP PINNING (Left) Patient reports pain as moderate.   Patient is well, and has had no acute complaints or problems Plan is to go Rehab after hospital stay. Negative for chest pain and shortness of breath Fever: no Gastrointestinal: Negative for nausea and vomiting  Objective: Vital signs in last 24 hours: Temp:  [97.9 F (36.6 C)-99.6 F (37.6 C)] 98.7 F (37.1 C) (01/29 0525) Pulse Rate:  [60-69] 62 (01/29 0525) Resp:  [10-18] 18 (01/29 0525) BP: (104-156)/(61-75) 139/69 (01/29 0525) SpO2:  [93 %-100 %] 98 % (01/29 0525)  Intake/Output from previous day:  Intake/Output Summary (Last 24 hours) at 03/30/2021 0649 Last data filed at 03/30/2021 0528 Gross per 24 hour  Intake 240 ml  Output 300 ml  Net -60 ml    Intake/Output this shift: Total I/O In: -  Out: 300 [Urine:300]  Labs: Recent Labs    03/27/21 1510 03/28/21 0558 03/29/21 0444  HGB 11.5* 10.5* 9.6*   Recent Labs    03/28/21 0558 03/29/21 0444  WBC 10.3 10.4  RBC 3.26* 3.00*  HCT 32.1* 29.5*  PLT 260 241   Recent Labs    03/28/21 0558 03/29/21 0444  NA 137 140  K 3.5 4.2  CL 104 106  CO2 24 26  BUN 20 23  CREATININE 1.35* 1.14*  GLUCOSE 121* 129*  CALCIUM 9.4 8.9   No results for input(s): LABPT, INR in the last 72 hours.   EXAM General - Patient is Alert and mildly confused Extremity - Sensation intact distally Dorsiflexion/Plantar flexion intact Compartment soft Dressing/Incision - clean, dry, no drainage Motor Function - intact, moving foot and toes well on exam.   Past Medical History:  Diagnosis Date   Anxiety    Depression    Hypercholesterolemia    Knee pain    Uterine cancer (HCC)     Assessment/Plan: 2 Days Post-Op Procedure(s) (LRB): CANNULATED HIP PINNING (Left) Principal Problem:   Closed left hip fracture (HCC) Active Problems:   CKD (chronic kidney disease) stage 3, GFR 30-59 ml/min (HCC)    Essential hypertension   Coronary artery disease due to lipid rich plaque   Simple chronic bronchitis (HCC)   Smoker   DNR (do not resuscitate)/DNI(Do Not Intubate)  Estimated body mass index is 27.46 kg/m as calculated from the following:   Height as of this encounter: 5\' 3"  (1.6 m).   Weight as of this encounter: 70.3 kg. Advance diet Up with therapy  Discharge planning to rehab.  DVT Prophylaxis - Lovenox and compression hose Weight-Bearing as tolerated to left leg  Reche Dixon, PA-C Orthopaedic Surgery 03/30/2021, 6:49 AM

## 2021-03-30 NOTE — Progress Notes (Signed)
Physical Therapy Treatment Patient Details Name: Chelsea Hart MRN: [id number] DOB: 12/01/1933 Today's Date: 03/30/2021   History of Present Illness 88 year old female who is from [place] who is a here visiting her son. Patient reports she drove herself from her home in [place] to [place]. Sister present and reports patient had a fall prior to getting to her son's house and then fell again coming out of the bathroom at her son's. She lives independently. Xrays revealed a hip fracture, pt underwent surgery for left cannulated hip pinning.    PT Comments    Patient in bed with daughter present upon PT arrival. She is agreeable to therapy. Has noticeable right drift of torso. Patient very pleasantly confused, daughter reports this is different from baseline. Patient able to follow intermittent simple commands but not complex commands. Inconsistant command follow allows for supine strengthening to be performed with max cueing for task orientation. Supine to sit requires max A, transition to standing performed with max A and max cueing for sequencing. Attempt at SPT unsuccessful with patient unable to follow commands to pick feet up for sequencing. Patient returned to seated after prolonged stand and performed seated stabilization technique for COM. Returned to supine with needs met, physician entered room. Discussed AMS issues regarding therapy progression. Reached out to physician regarding status of MRI as patient's cognition is primary impairment for rehab at this time. Current POC remains appropriate.     Recommendations for follow up therapy are one component of a multi-disciplinary discharge planning process, led by the attending physician.  Recommendations may be updated based on patient status, additional functional criteria and insurance authorization.  Follow Up Recommendations  Skilled nursing-short term rehab (<3 hours/day)     Assistance Recommended at Discharge  Frequent or constant Supervision/Assistance  Patient can return home with the following Two people to help with walking and/or transfers;Two people to help with bathing/dressing/bathroom;Assistance with cooking/housework;Direct supervision/assist for medications management;Assist for transportation;Direct supervision/assist for financial management;Assistance with feeding;Help with stairs or ramp for entrance   Equipment Recommendations  Rolling walker (2 wheels);BSC/3in1    Recommendations for Other Services       Precautions / Restrictions Precautions Precautions: Fall Restrictions Weight Bearing Restrictions: Yes LLE Weight Bearing: Weight bearing as tolerated     Mobility  Bed Mobility Overal bed mobility: Needs Assistance Bed Mobility: Supine to Sit, Sit to Supine     Supine to sit: Max assist Sit to supine: Max assist, +2 for physical assistance   General bed mobility comments: hand-over-hand to initiate and complete movement transitions    Transfers Overall transfer level: Needs assistance Equipment used: Rolling walker (2 wheels) Transfers: Sit to/from Stand, Bed to chair/wheelchair/BSC Sit to Stand: Max assist           General transfer comment: cognive deficits significantly impair transitions, physician notified    Ambulation/Gait               General Gait Details: unable to attempt due to cognition   Stairs             Wheelchair Mobility    Modified Rankin (Stroke Patients Only)       Balance Overall balance assessment: Needs assistance Sitting-balance support: Feet supported, Bilateral upper extremity supported Sitting balance-Leahy Scale: Poor Sitting balance - Comments: mod max cues for upright position, requires physican assist to obtain center. Postural control: Posterior lean, Right lateral lean Standing balance support: Bilateral upper extremity supported, Reliant on assistive device for balance Standing  balance-Leahy  Scale: Poor Standing balance comment: heavy reliance upon UE's for standing                            Cognition Arousal/Alertness: Awake/alert Behavior During Therapy: Restless, Anxious Overall Cognitive Status: Impaired/Different from baseline                                 General Comments: Patient pending MRI for altered mental status        Exercises General Exercises - Lower Extremity Ankle Circles/Pumps: Strengthening, Both, 15 reps, Supine Heel Slides: Strengthening, Both, 10 reps, Supine Other Exercises Other Exercises: Sit to stand x 3 attempt, standing x2 minutes with max A to retain standing position. Max a for posture in seated position    General Comments General comments (skin integrity, edema, etc.): patient cognition limit session      Pertinent Vitals/Pain Pain Assessment Pain Assessment: Faces Faces Pain Scale: Hurts little more Pain Location: L hip Pain Descriptors / Indicators: Aching, Grimacing Pain Intervention(s): Limited activity within patient's tolerance, Monitored during session, Repositioned    Home Living                          Prior Function            PT Goals (current goals can now be found in the care plan section) Acute Rehab PT Goals Patient Stated Goal: to be as independent as I can be PT Goal Formulation: With patient Time For Goal Achievement: 04/12/21 Potential to Achieve Goals: Fair Progress towards PT goals: Progressing toward goals    Frequency    7X/week      PT Plan Current plan remains appropriate    Co-evaluation              AM-PAC PT "6 Clicks" Mobility   Outcome Measure  Help needed turning from your back to your side while in a flat bed without using bedrails?: A Lot Help needed moving from lying on your back to sitting on the side of a flat bed without using bedrails?: Total Help needed moving to and from a bed to a chair (including a wheelchair)?:  Total Help needed standing up from a chair using your arms (e.g., wheelchair or bedside chair)?: A Lot Help needed to walk in hospital room?: Total Help needed climbing 3-5 steps with a railing? : Total 6 Click Score: 8    End of Session Equipment Utilized During Treatment: Gait belt Activity Tolerance: Patient limited by pain;Other (comment) (altered mental status) Patient left: in bed;with call bell/phone within reach;with bed alarm set;with family/visitor present;with SCD's reapplied (physician in room) Nurse Communication: Mobility status PT Visit Diagnosis: Muscle weakness (generalized) (M62.81);Other abnormalities of gait and mobility (R26.89);Pain Pain - Right/Left: Left Pain - part of body: Hip     Time: 1040-1108 PT Time Calculation (min) (ACUTE ONLY): 28 min  Charges:  $Therapeutic Exercise: 8-22 mins $Therapeutic Activity: 8-22 mins                    Janna Arch, PT, DPT  03/30/2021, 1:59 PM

## 2021-03-30 NOTE — Plan of Care (Signed)

## 2021-03-30 NOTE — TOC Progression Note (Signed)
Transition of Care [facility]) - Progression Note    Patient Details  Name: Chelsea Hart MRN: [id number] Date of Birth: 03-Jan-1934  [facility] CM/SW [place], RN Phone Number: 03/30/2021, 10:45 AM  Clinical Narrative:   Recommendation for SNF.  Spoke to daughter, states she would like to have patient transferred to SNF in [place].  SNF workup started, TOC will follow up with daughter about bed offers.  TOC contact information provided, TOC to follow          Expected Discharge Plan and Services                                                 Social Determinants of Health (SDOH) Interventions    Readmission Risk Interventions No flowsheet data found.

## 2021-03-30 NOTE — NC FL2 (Signed)
[place] LEVEL OF CARE SCREENING TOOL     IDENTIFICATION  Patient Name: Chelsea Hart Birthdate: [birth year] Sex: female Admission Date (Current Location): 03/27/2021  [facility] and [place] Number:  Engineering geologist and Address:  [facility], [address] [place], [place] [address]      Provider Number: [id number]  Attending Physician Name and Address:  Sharen Hones, MD  Relative Name and Phone Number:  wan,tina Daughter     [phone]    Current Level of Care: Hospital Recommended Level of Care: [place] Prior Approval Number:    Date Approved/Denied:   PASRR Number: [id number]  Discharge Plan: SNF    Current Diagnoses: Patient Active Problem List   Diagnosis Date Noted   Closed left hip fracture ([place]) 03/27/2021   DNR (do not resuscitate)/DNI(Do Not Intubate) 03/27/2021   Coronary artery disease due to lipid rich plaque 11/14/2020   Smoker 10/03/2018   CKD (chronic kidney disease) stage 3, GFR 30-59 ml/min ([place]) 04/26/2017   Simple chronic bronchitis ([place]) 12/12/2015   Essential hypertension 10/30/2015    Orientation RESPIRATION BLADDER Height & Weight     Self, Time, Situation, Place  Normal (94% on room air) Incontinent, External catheter Weight: 70.3 kg Height:  5\' 3"  (160 cm)  BEHAVIORAL SYMPTOMS/MOOD NEUROLOGICAL BOWEL NUTRITION STATUS      Continent Diet (DYS 3)  AMBULATORY STATUS COMMUNICATION OF NEEDS Skin    (ambulation was deferred) Verbally Surgical wounds (Surgical incision L hip, honeycomb dressing)                       Personal Care Assistance Level of Assistance  Bathing, Feeding, Dressing Bathing Assistance: Limited assistance (upper minimal, lower max) Feeding assistance: Limited assistance Dressing Assistance: Limited assistance (upper minimal, lower max)     Functional Limitations Info  Sight, Hearing, Speech Sight Info: Adequate Hearing Info: Adequate Speech Info:  Adequate    SPECIAL CARE FACTORS FREQUENCY  PT (By licensed PT), OT (By licensed OT)     PT Frequency: Min 5x weekly OT Frequency: Min 5x weekly            Contractures Contractures Info: Not present    Additional Factors Info  Code Status, Allergies Code Status Info: DNR Allergies Info: Penicillins           Current Medications (03/30/2021):  This is the current hospital active medication list Current Facility-Administered Medications  Medication Dose Route Frequency Provider Last Rate Last Admin   alum & mag hydroxide-simeth (MAALOX/MYLANTA) 200-200-20 MG/5ML suspension 30 mL  30 mL Oral Q4H PRN Hessie Knows, MD       ascorbic acid (VITAMIN C) tablet 500 mg  500 mg Oral Daily Hessie Knows, MD   500 mg at 03/30/21 1660   aspirin EC tablet 81 mg  81 mg Oral BID Kristopher Oppenheim, DO   81 mg at 03/30/21 6301   bisacodyl (DULCOLAX) suppository 10 mg  10 mg Rectal Daily PRN Hessie Knows, MD       calcium carbonate (OS-CAL - dosed in mg of elemental calcium) tablet 500 mg of elemental calcium  1 tablet Oral Q breakfast Hessie Knows, MD   500 mg of elemental calcium at 03/30/21 6010   ceFAZolin (ANCEF) IVPB 1 g/50 mL premix  1 g Intravenous Once Kristopher Oppenheim, DO       cholecalciferol (VITAMIN D3) tablet 2,000 Units  2,000 Units Oral Daily Hessie Knows, MD   2,000 Units  at 03/30/21 0823   docusate sodium (COLACE) capsule 100 mg  100 mg Oral BID Kristopher Oppenheim, DO   100 mg at 03/30/21 0823   enoxaparin (LOVENOX) injection 40 mg  40 mg Subcutaneous Q24H Hessie Knows, MD   40 mg at 03/30/21 2637   feeding supplement (ENSURE ENLIVE / ENSURE PLUS) liquid 237 mL  237 mL Oral BID BM Sharen Hones, MD   237 mL at 03/30/21 8588   fenofibrate tablet 54 mg  54 mg Oral Daily Kristopher Oppenheim, DO   54 mg at 03/30/21 5027   fentaNYL (SUBLIMAZE) injection 50 mcg  50 mcg Intravenous Q1H PRN Kristopher Oppenheim, DO   50 mcg at 03/28/21 1539   HYDROcodone-acetaminophen (NORCO/VICODIN) 5-325 MG per tablet 1-2 tablet  1-2  tablet Oral Q6H PRN Kristopher Oppenheim, DO   1 tablet at 03/29/21 2235   lactated ringers infusion   Intravenous Continuous Sharen Hones, MD       latanoprost (XALATAN) 0.005 % ophthalmic solution 1 drop  1 drop Both Eyes QHS Kristopher Oppenheim, DO   1 drop at 03/29/21 2133   lidocaine (LIDODERM) 5 % 1 patch  1 patch Transdermal Q24H Sharen Hones, MD   1 patch at 03/29/21 1301   loratadine (CLARITIN) tablet 10 mg  10 mg Oral Daily PRN Hessie Knows, MD       magnesium hydroxide (MILK OF MAGNESIA) suspension 30 mL  30 mL Oral Daily PRN Hessie Knows, MD   30 mL at 03/30/21 0834   menthol-cetylpyridinium (CEPACOL) lozenge 3 mg  1 lozenge Oral PRN Hessie Knows, MD       Or   phenol (CHLORASEPTIC) mouth spray 1 spray  1 spray Mouth/Throat PRN Hessie Knows, MD       methocarbamol (ROBAXIN) tablet 500 mg  500 mg Oral Q6H PRN Kristopher Oppenheim, DO   500 mg at 03/30/21 0532   Or   methocarbamol (ROBAXIN) 500 mg in dextrose 5 % 50 mL IVPB  500 mg Intravenous Q6H PRN Kristopher Oppenheim, DO       metoCLOPramide (REGLAN) tablet 5-10 mg  5-10 mg Oral Q8H PRN Hessie Knows, MD       Or   metoCLOPramide (REGLAN) injection 5-10 mg  5-10 mg Intravenous Q8H PRN Hessie Knows, MD       metoprolol tartrate (LOPRESSOR) tablet 25 mg  25 mg Oral BID Kristopher Oppenheim, DO   25 mg at 03/30/21 7412   morphine 2 MG/ML injection 0.5 mg  0.5 mg Intravenous Q2H PRN Kristopher Oppenheim, DO   0.5 mg at 03/28/21 1207   multivitamin with minerals tablet 1 tablet  1 tablet Oral Daily Sharen Hones, MD   1 tablet at 03/30/21 0823   ondansetron (ZOFRAN) tablet 4 mg  4 mg Oral Q6H PRN Hessie Knows, MD       Or   ondansetron [facility]) injection 4 mg  4 mg Intravenous Q6H PRN Hessie Knows, MD       pantoprazole (PROTONIX) EC tablet 40 mg  40 mg Oral Daily Hessie Knows, MD   40 mg at 03/30/21 8786   PARoxetine (PAXIL) tablet 40 mg  40 mg Oral Daily Kristopher Oppenheim, DO   40 mg at 03/30/21 7672   simvastatin (ZOCOR) tablet 20 mg  20 mg Oral QHS Kristopher Oppenheim, DO   20 mg at 03/29/21 2237    zolpidem (AMBIEN) tablet 5 mg  5 mg Oral QHS PRN Hessie Knows, MD  Discharge Medications: Please see discharge summary for a list of discharge medications.  Relevant Imaging Results:  Relevant Lab Results:   Additional Information SSN [id number]  Pete Pelt, RN

## 2021-03-31 ENCOUNTER — Encounter: Payer: Self-pay | Admitting: Orthopedic Surgery

## 2021-03-31 ENCOUNTER — Inpatient Hospital Stay: Payer: Medicare Other

## 2021-03-31 DIAGNOSIS — I671 Cerebral aneurysm, nonruptured: Secondary | ICD-10-CM

## 2021-03-31 DIAGNOSIS — N289 Disorder of kidney and ureter, unspecified: Secondary | ICD-10-CM

## 2021-03-31 DIAGNOSIS — N179 Acute kidney failure, unspecified: Secondary | ICD-10-CM

## 2021-03-31 LAB — BASIC METABOLIC PANEL
Anion gap: 7 (ref 5–15)
BUN: 21 mg/dL (ref 8–23)
CO2: 26 mmol/L (ref 22–32)
Calcium: 9.4 mg/dL (ref 8.9–10.3)
Chloride: 103 mmol/L (ref 98–111)
Creatinine, Ser: 0.98 mg/dL (ref 0.44–1.00)
GFR, Estimated: 57 mL/min — ABNORMAL LOW (ref >60)
Glucose, Bld: 138 mg/dL — ABNORMAL HIGH (ref 70–99)
Potassium: 3.7 mmol/L (ref 3.5–5.1)
Sodium: 136 mmol/L (ref 135–145)

## 2021-03-31 LAB — MAGNESIUM: Magnesium: 2 mg/dL (ref 1.7–2.4)

## 2021-03-31 MED ORDER — IOHEXOL 350 MG/ML SOLN
75.0000 mL | Freq: Once | INTRAVENOUS | Status: AC | PRN
  Administered 2021-03-31: 75 mL via INTRAVENOUS

## 2021-03-31 NOTE — Progress Notes (Signed)
PROGRESS NOTE    Chelsea Hart  ATF:[id number] DOB: 1934/02/21 DOA: 03/27/2021 PCP: Pcp, No    Brief Narrative:  88 year old female who is from [facility] who is a here visiting her son.  She also dropped off a friend in [place].  Patient still driving.  She lives independently.  She states that she fell at her son's house yesterday morning.  She was able to get back up.  She states that her left leg gave out from her when she was walking.   X-ray showed a left hip fracture.  Hip pinning was performed on 1/27. Patient developed facial droop probably from pain medicine 1/28.  MRI brain had incidental finding of brain aneurysm, possible venous thrombus.  Seen by neurology, CT angiogram and CT venogram of the head showed 8 mm left PICA aneurysm, subsegmental PE at right upper lobe.  No evidence of venous thrombosis.  Assessment & Plan:   Principal Problem:   Closed left hip fracture (HCC) Active Problems:   CKD (chronic kidney disease) stage 3, GFR 30-59 ml/min (HCC)   Essential hypertension   Coronary artery disease due to lipid rich plaque   Simple chronic bronchitis (HCC)   Smoker   DNR (do not resuscitate)/DNI(Do Not Intubate)  Brain aneurysm. This is an incidental finding.  Patient does has a risk of a brain bleed.  Discussed with neurology, patient will follow-up with neurosurgery as outpatient.  Anticoagulation could possibly increase the risk of a bleeding.  Small subsegmental PE in right upper lobe. Subsegmental PE does not require anticoagulation.  DVT is checked on bilateral lower extremities.  I will consider a full chest CT angiogram in 1 to 2 days to evaluate the PE burden.  Patient just had contrast today, cannot have CT scan again today.  Left hip fracture. Status post the surgery. Patient doing better today.  Pain under control.  Appetite is still poor, but gradually improving.  Acute kidney injury. Chronic kidney disease stage IIIa ruled out. Patient  had elevated creatinine at time admission, which has resolved since admission.  Condition is consistent with AKI instead of CKD.  Essential hypertension  Continue beta-blocker.  Coronary artery disease. Continue Lopressor and Zocor.  Chronic bronchitis Tobacco abuse. Advised to quit.   Neck pain. Secondary to arthritis.  CT scan negative.  Continue pain medicine and the Lidoderm patch.   DVT prophylaxis: SCds Code Status: DNR Family Communication:  Disposition Plan: SNF     Status is: Inpatient   Remains inpatient appropriate because: Severity of disease, unsafe discharge.      I/O last 3 completed shifts: In: 860.3 [P.O.:480; I.V.:380.3] Out: 1250 [Urine:1250] No intake/output data recorded.     Consultants:  Orthopedics, neurology  Procedures: Hip  Antimicrobials: None  Subjective: Patient feels better today, she denies any short of breath or cough. No chest pain. Hip pain under control. She started eating, appetite still poor.  But she is drinking water.   Objective: Vitals:   03/30/21 1028 03/30/21 1954 03/31/21 0551 03/31/21 0804  BP: (!) 152/78 (!) 143/71 (!) 166/80 (!) 164/77  Pulse: 99 73 62 63  Resp: 18 20 16 15   Temp: 99.5 F (37.5 C) 99.3 F (37.4 C) 98.1 F (36.7 C) 98.3 F (36.8 C)  TempSrc: Oral Oral Oral   SpO2:  97% 97% 98%  Weight:      Height:        Intake/Output Summary (Last 24 hours) at 03/31/2021 1028 Last data filed at 03/31/2021  0542 Gross per 24 hour  Intake 860.33 ml  Output 950 ml  Net -89.67 ml   Filed Weights   03/27/21 1508  Weight: 70.3 kg    Examination:  General exam: Appears calm and comfortable  Respiratory system: Clear to auscultation. Respiratory effort normal. Cardiovascular system: S1 & S2 heard, RRR. No JVD, murmurs, rubs, gallops or clicks. No pedal edema. Gastrointestinal system: Abdomen is nondistended, soft and nontender. No organomegaly or masses felt. Normal bowel sounds heard. Central  nervous system: Alert and oriented x2. No focal neurological deficits. Extremities: Symmetric 5 x 5 power. Skin: No rashes, lesions or ulcers Psychiatry: Mood & affect appropriate.     Data Reviewed: I have personally reviewed following labs and imaging studies  CBC: Recent Labs  Lab 03/27/21 1510 03/28/21 0558 03/29/21 0444  WBC 9.6 10.3 10.4  NEUTROABS  --  6.4 6.8  HGB 11.5* 10.5* 9.6*  HCT 34.4* 32.1* 29.5*  MCV 95.0 98.5 98.3  PLT 278 260 174   Basic Metabolic Panel: Recent Labs  Lab 03/27/21 1510 03/28/21 0558 03/29/21 0444 03/31/21 0427  NA 137 137 140 136  K 3.6 3.5 4.2 3.7  CL 103 104 106 103  CO2 25 24 26 26   GLUCOSE 108* 121* 129* 138*  BUN 17 20 23 21   CREATININE 1.13* 1.35* 1.14* 0.98  CALCIUM 10.1 9.4 8.9 9.4  MG  --   --  1.8 2.0   GFR: Estimated Creatinine Clearance: 39.5 mL/min (by C-G formula based on SCr of 0.98 mg/dL). Liver Function Tests: Recent Labs  Lab 03/28/21 0558  AST 19  ALT 15  ALKPHOS 64  BILITOT 0.5  PROT 6.5  ALBUMIN 3.3*   No results for input(s): LIPASE, AMYLASE in the last 168 hours. No results for input(s): AMMONIA in the last 168 hours. Coagulation Profile: No results for input(s): INR, PROTIME in the last 168 hours. Cardiac Enzymes: Recent Labs  Lab 03/27/21 1510  CKTOTAL 106   BNP (last 3 results) No results for input(s): PROBNP in the last 8760 hours. HbA1C: No results for input(s): HGBA1C in the last 72 hours. CBG: No results for input(s): GLUCAP in the last 168 hours. Lipid Profile: No results for input(s): CHOL, HDL, LDLCALC, TRIG, CHOLHDL, LDLDIRECT in the last 72 hours. Thyroid Function Tests: No results for input(s): TSH, T4TOTAL, FREET4, T3FREE, THYROIDAB in the last 72 hours. Anemia Panel: No results for input(s): VITAMINB12, FOLATE, FERRITIN, TIBC, IRON, RETICCTPCT in the last 72 hours. Sepsis Labs: No results for input(s): PROCALCITON, LATICACIDVEN in the last 168 hours.  Recent Results  (from the past 240 hour(s))  Resp Panel by RT-PCR (Flu A&B, Covid) Nasopharyngeal Swab     Status: None   Collection Time: 03/27/21  6:54 PM   Specimen: Nasopharyngeal Swab; Nasopharyngeal(NP) swabs in vial transport medium  Result Value Ref Range Status   SARS Coronavirus 2 by RT PCR NEGATIVE NEGATIVE Final    Comment: (NOTE) SARS-CoV-2 target nucleic acids are NOT DETECTED.  The SARS-CoV-2 RNA is generally detectable in upper respiratory specimens during the acute phase of infection. The lowest concentration of SARS-CoV-2 viral copies this assay can detect is 138 copies/mL. A negative result does not preclude SARS-Cov-2 infection and should not be used as the sole basis for treatment or other patient management decisions. A negative result may occur with  improper specimen collection/handling, submission of specimen other than nasopharyngeal swab, presence of viral mutation(s) within the areas targeted by this assay, and inadequate number of viral  copies(<138 copies/mL). A negative result must be combined with clinical observations, patient history, and epidemiological information. The expected result is Negative.  Fact Sheet for Patients:  EntrepreneurPulse.com.au  Fact Sheet for Healthcare Providers:  IncredibleEmployment.be  This test is no t yet approved or cleared by the [place] FDA and  has been authorized for detection and/or diagnosis of SARS-CoV-2 by FDA under an Emergency Use Authorization (EUA). This EUA will remain  in effect (meaning this test can be used) for the duration of the COVID-19 declaration under Section 564(b)(1) of the Act, 21 U.S.C.section 360bbb-3(b)(1), unless the authorization is terminated  or revoked sooner.       Influenza A by PCR NEGATIVE NEGATIVE Final   Influenza B by PCR NEGATIVE NEGATIVE Final    Comment: (NOTE) The Xpert Xpress SARS-CoV-2/FLU/RSV plus assay is intended as an aid in the  diagnosis of influenza from Nasopharyngeal swab specimens and should not be used as a sole basis for treatment. Nasal washings and aspirates are unacceptable for Xpert Xpress SARS-CoV-2/FLU/RSV testing.  Fact Sheet for Patients: EntrepreneurPulse.com.au  Fact Sheet for Healthcare Providers: IncredibleEmployment.be  This test is not yet approved or cleared by the [place] FDA and has been authorized for detection and/or diagnosis of SARS-CoV-2 by FDA under an Emergency Use Authorization (EUA). This EUA will remain in effect (meaning this test can be used) for the duration of the COVID-19 declaration under Section 564(b)(1) of the Act, 21 U.S.C. section 360bbb-3(b)(1), unless the authorization is terminated or revoked.  Performed at [facility], [address] [place], [place] [address]   Surgical PCR screen     Status: None   Collection Time: 03/28/21  4:04 AM   Specimen: Nasal Mucosa; Nasal Swab  Result Value Ref Range Status   MRSA, PCR NEGATIVE NEGATIVE Final   Staphylococcus aureus NEGATIVE NEGATIVE Final    Comment: (NOTE) The Xpert SA Assay (FDA approved for NASAL specimens in patients 22 years of age and older), is one component of a comprehensive surveillance program. It is not intended to diagnose infection nor to guide or monitor treatment. Performed at [facility], [address] [place], [place] [address]          Radiology Studies: CT ANGIO HEAD NECK W WO CM  Result Date: 03/31/2021 CLINICAL DATA:  Neuro deficit, acute, stroke suspected + aneurysm L V4 segment?. Suspected left V4 segment aneurysm on MRI. EXAM: CT ANGIOGRAPHY HEAD AND NECK TECHNIQUE: Multidetector CT imaging of the head and neck was performed using the standard protocol during bolus administration of intravenous contrast. Multiplanar CT image reconstructions and MIPs were obtained to evaluate the vascular anatomy. Carotid stenosis  measurements (when applicable) are obtained utilizing NASCET criteria, using the distal internal carotid diameter as the denominator. RADIATION DOSE REDUCTION: This exam was performed according to the departmental dose-optimization program which includes automated exposure control, adjustment of the mA and/or kV according to patient size and/or use of iterative reconstruction technique. CONTRAST:  79mL OMNIPAQUE IOHEXOL 350 MG/ML SOLN COMPARISON:  Head MRI 03/30/2021 FINDINGS: CT HEAD FINDINGS Brain: There is no evidence of an acute infarct, intracranial hemorrhage, mass, midline shift, or extra-axial fluid collection. Patchy to confluent hypodensities in the cerebral white matter bilaterally are unchanged and nonspecific but compatible with moderately extensive chronic small vessel ischemic disease. Chronic lacunar infarcts are again noted in the basal ganglia and cerebellum, and a small chronic cortical and subcortical infarct is again noted in the right frontal operculum and insula. There is  mild cerebral atrophy. Vascular: Calcified atherosclerosis at the skull base. Skull: No fracture or suspicious osseous lesion. Sinuses: Paranasal sinuses and mastoid air cells are clear. Orbits: Bilateral cataract extraction. Review of the MIP images confirms the above findings CTA NECK FINDINGS Aortic arch: Standard 3 vessel aortic arch with moderate atherosclerotic plaque. Patent brachiocephalic and subclavian arteries without evidence of significant stenosis. Right carotid system: Patent with a small to moderate amount of predominantly calcified plaque at the carotid bifurcation. No evidence of a significant stenosis or dissection. Left carotid system: Patent with a small amount of predominantly calcified plaque at the carotid bifurcation. No evidence of a significant stenosis or dissection. Retropharyngeal course of the distal common carotid artery. Vertebral arteries: Patent and codominant without evidence significant  stenosis or dissection. Skeleton: Advanced cervical disc and facet degeneration. Other neck: Bilateral thyroid nodules measuring up to 1.5 cm. No evidence of cervical lymphadenopathy. Upper chest: Mild interlobular septal thickening and mild ground-glass opacity in the lung apices. Small volume distal segmental and subsegmental pulmonary arterial emboli in the right upper lobe. Review of the MIP images confirms the above findings CTA HEAD FINDINGS Anterior circulation: The internal carotid arteries are patent from skull base to carotid termini with mild atherosclerotic plaque bilaterally not resulting in significant stenosis. ACAs and MCAs are patent without evidence of a proximal branch occlusion or significant proximal stenosis. No aneurysm is identified. Posterior circulation: The intracranial vertebral arteries are widely patent to the basilar. A left PICA aneurysm measures 8 x 7 mm. The basilar artery is widely patent. Posterior communicating arteries are diminutive or absent. Both PCAs are patent with mild atherosclerotic irregularity but no evidence of a flow limiting proximal stenosis. Venous sinuses: More fully evaluated on the separate dedicated CT venogram. Anatomic variants: None. Review of the MIP images confirms the above findings IMPRESSION: 1. Distal segmental and subsegmental pulmonary arterial emboli in the right upper lobe. 2. 8 mm left PICA aneurysm. 3. Mild atherosclerosis in the head and neck without large vessel occlusion or significant proximal stenosis. 4. Suspected mild pulmonary edema. 5. Aortic Atherosclerosis (ICD10-I70.0). Critical Value/emergent results were called by telephone at the time of interpretation on 03/31/2021 at 10:10 am to Dr. Su Monks, who verbally acknowledged these results. Electronically Signed   By: Logan Bores M.D.   On: 03/31/2021 10:11   CT HEAD WO CONTRAST (5MM)  Result Date: 03/29/2021 CLINICAL DATA:  Neuro deficit with acute stroke suspected EXAM: CT  HEAD WITHOUT CONTRAST TECHNIQUE: Contiguous axial images were obtained from the base of the skull through the vertex without intravenous contrast. RADIATION DOSE REDUCTION: This exam was performed according to the departmental dose-optimization program which includes automated exposure control, adjustment of the mA and/or kV according to patient size and/or use of iterative reconstruction technique. COMPARISON:  Head CT 03/27/2021 FINDINGS: Brain: No evidence of acute infarction, hemorrhage, hydrocephalus, extra-axial collection or mass lesion/mass effect. Chronic small vessel ischemic gliosis. Chronic small-vessel infarcts at the right frontal operculum/upper insula and right cerebellum. Chronic lacunes at the deep gray nuclei. Vascular: No hyperdense vessel or unexpected calcification. Skull: Normal. Negative for fracture or focal lesion. Sinuses/Orbits: No acute finding. IMPRESSION: 1. No acute finding. 2. Extensive chronic ischemic injury. Electronically Signed   By: Jorje Guild M.D.   On: 03/29/2021 11:48   MR BRAIN WO CONTRAST  Result Date: 03/30/2021 CLINICAL DATA:  Right facial droop following hip surgery EXAM: MRI HEAD WITHOUT CONTRAST TECHNIQUE: Multiplanar, multiecho pulse sequences of the brain and surrounding structures were obtained  without intravenous contrast. COMPARISON:  Noncontrast CT head dated 1 day prior FINDINGS: Brain: There is no evidence of acute intracranial hemorrhage, extra-axial fluid collection, or acute infarct. There is mild global parenchymal volume loss with prominence of the ventricular system and extra-axial CSF spaces. There are multiple remote infarcts in the bilateral basal ganglia, bilateral cerebellar hemispheres, and right frontal lobe. There is extensive patchy FLAIR signal abnormality in the subcortical and periventricular white matter likely reflecting sequela of moderate chronic white matter microangiopathy. There is no mass lesion.  There is no midline shift.  Vascular: The major arterial flow voids are present. There is a suspected 6 mm aneurysm arising from the left V4 segment (10-6). There is abnormal T2 hyperintensity in the left sigmoid sinus. Skull and upper cervical spine: Normal marrow signal. Sinuses/Orbits: The paranasal sinuses are clear. Bilateral lens implants are in place. The globes and orbits are otherwise unremarkable. Other: None. IMPRESSION: 1. Abnormal T2 signal in the left sigmoid sinus could reflect slow flow; however, this could also reflect venous sinus thrombosis. 2. Suspected aneurysm arising from the left V4 segment. Recommend CTA and CTV for further evaluation of both of these findings. 3. Otherwise, no acute intracranial pathology. 4. Multiple remote infarcts on a background of moderate chronic white matter microangiopathy as above. Electronically Signed   By: Valetta Mole M.D.   On: 03/30/2021 18:21   CT VENOGRAM HEAD  Result Date: 03/31/2021 CLINICAL DATA:  Dural venous sinus thrombosis suspected. Abnormal T2 signal in the left sigmoid sinus on MRI brain. EXAM: CT VENOGRAM HEAD TECHNIQUE: Venographic phase images of the brain were obtained following the administration of intravenous contrast. Multiplanar reformats and maximum intensity projections were generated. RADIATION DOSE REDUCTION: This exam was performed according to the departmental dose-optimization program which includes automated exposure control, adjustment of the mA and/or kV according to patient size and/or use of iterative reconstruction technique. CONTRAST:  72mL OMNIPAQUE IOHEXOL 350 MG/ML SOLN COMPARISON:  Head MRI 03/30/2021 FINDINGS: The superior sagittal sinus, internal cerebral veins, vein of Galen, straight sinus, transverse sinuses, sigmoid sinuses, and jugular bulbs are patent without evidence of thrombus. The right transverse and sigmoid sinuses are dominant. An arachnoid granulation is noted in the lateral aspect of the left transverse sinus. T2 hyperintensity  in the left sigmoid sinus on MRI likely reflects slow flow. IMPRESSION: No evidence of dural venous sinus thrombosis. Electronically Signed   By: Logan Bores M.D.   On: 03/31/2021 10:01        Scheduled Meds:  ascorbic acid  500 mg Oral Daily   aspirin EC  81 mg Oral BID   calcium carbonate  1 tablet Oral Q breakfast   cholecalciferol  2,000 Units Oral Daily   docusate sodium  100 mg Oral BID   enoxaparin (LOVENOX) injection  40 mg Subcutaneous Q24H   feeding supplement  237 mL Oral BID BM   fenofibrate  54 mg Oral Daily   latanoprost  1 drop Both Eyes QHS   lidocaine  1 patch Transdermal Q24H   metoprolol tartrate  25 mg Oral BID   multivitamin with minerals  1 tablet Oral Daily   pantoprazole  40 mg Oral Daily   PARoxetine  40 mg Oral Daily   simvastatin  20 mg Oral QHS   Continuous Infusions:   ceFAZolin (ANCEF) IV     methocarbamol (ROBAXIN) IV       LOS: 4 days    Time spent: 32 minutes    Sharen Hones,  MD [facility]   To contact the attending provider between 7A-7P or the covering provider during after hours 7P-7A, please log into the web site [email or web address] and access using universal [place] password for that web site. If you do not have the password, please call the hospital operator.  03/31/2021, 10:28 AM

## 2021-03-31 NOTE — TOC Progression Note (Addendum)
Transition of Care [facility]) - Progression Note    Patient Details  Name: Chelsea Hart MRN: [id number] Date of Birth: 27-Sep-1933  Transition of [facility] CM/SW Contact  Eileen Stanford, LCSW Phone Number: 03/31/2021, 2:07 PM  Clinical Narrative:  CSW provided bed offers to pt's daughter and she chooses [facility].   CSW asked [facility] to start auth. Pt has regular [employer]- not on navi portal.   [facility] started auth.    Expected Discharge Plan and Services                                                 Social Determinants of Health (SDOH) Interventions    Readmission Risk Interventions No flowsheet data found.

## 2021-03-31 NOTE — Progress Notes (Signed)
Physical Therapy Treatment Patient Details Name: Tyja Gortney MRN: [id number] DOB: January 03, 1934 Today's Date: 03/31/2021   History of Present Illness 88 year old female who is from [place] who is a here visiting her son. Patient reports she drove herself from her home in [place] to [place]. Sister present and reports patient had a fall prior to getting to her son's house and then fell again coming out of the bathroom at her son's. She lives independently. Xrays revealed a hip fracture, pt underwent surgery for left cannulated hip pinning.    PT Comments    Pt received sitting in recliner, daughter in room, states she would like to get back to the bed. Daughter left shortly after PT arrived. STS continues to require MAX A. Pt did progress mobility to a stand pivot transfer performed with MOD A of 1 person. Extensive time required - pt remained standing for 8 minutes for standing transfer. Heavy steadying was required when WB through LLE. Multimodal cueing for improved and safer technique. Ambulation not attempted at this time for safety. Pt does follow single step commands well; she responds well to encouragement. Discharge rec for STR continues to be appropriate. Would benefit from skilled PT to address above deficits and promote optimal return to PLOF.   Recommendations for follow up therapy are one component of a multi-disciplinary discharge planning process, led by the attending physician.  Recommendations may be updated based on patient status, additional functional criteria and insurance authorization.  Follow Up Recommendations  Skilled nursing-short term rehab (<3 hours/day)     Assistance Recommended at Discharge Frequent or constant Supervision/Assistance  Patient can return home with the following Two people to help with walking and/or transfers;Two people to help with bathing/dressing/bathroom;Assistance with cooking/housework;Direct supervision/assist for  medications management;Assist for transportation;Direct supervision/assist for financial management;Assistance with feeding;Help with stairs or ramp for entrance   Equipment Recommendations  Other (comment) (TBD at next venue of care)    Recommendations for Other Services       Precautions / Restrictions Precautions Precautions: Fall Restrictions Weight Bearing Restrictions: Yes LLE Weight Bearing: Weight bearing as tolerated     Mobility  Bed Mobility Overal bed mobility: Needs Assistance Bed Mobility: Sit to Supine       Sit to supine: Mod assist   General bed mobility comments: MOD A to manage BLE back to bed    Transfers Overall transfer level: Needs assistance Equipment used: Rolling walker (2 wheels) Transfers: Sit to/from Stand, Bed to chair/wheelchair/BSC Sit to Stand: Max assist Stand pivot transfers: Mod assist         General transfer comment: MAX A to lift and steady during STS. MOD A to steady, manage RW and provide multimodal cueing during pivot.    Ambulation/Gait               General Gait Details: unsafe to attempt at this time   Stairs             Wheelchair Mobility    Modified Rankin (Stroke Patients Only)       Balance Overall balance assessment: Needs assistance Sitting-balance support: Feet supported, Bilateral upper extremity supported Sitting balance-Leahy Scale: Fair Sitting balance - Comments: Remained sitting EOB for 1 minute with UE support, CGA for safety Postural control: Posterior lean, Right lateral lean Standing balance support: Bilateral upper extremity supported, Reliant on assistive device for balance Standing balance-Leahy Scale: Poor Standing balance comment: heavy reliance upon UE's for standing with steadying assist  Cognition Arousal/Alertness: Awake/alert Behavior During Therapy: Restless, Anxious Overall Cognitive Status: Impaired/Different from baseline                                  General Comments: Slow response time. Able to repeat back task to be performed.        Exercises      General Comments        Pertinent Vitals/Pain Pain Assessment Pain Assessment: 0-10 Pain Score: 9  Pain Location: L hip Pain Descriptors / Indicators: Aching, Grimacing, Discomfort    Home Living                          Prior Function            PT Goals (current goals can now be found in the care plan section) Acute Rehab PT Goals Patient Stated Goal: to be as independent as I can be PT Goal Formulation: With patient Time For Goal Achievement: 04/12/21 Potential to Achieve Goals: Fair    Frequency    7X/week      PT Plan      Co-evaluation              AM-PAC PT "6 Clicks" Mobility   Outcome Measure  Help needed turning from your back to your side while in a flat bed without using bedrails?: A Lot Help needed moving from lying on your back to sitting on the side of a flat bed without using bedrails?: A Lot Help needed moving to and from a bed to a chair (including a wheelchair)?: A Lot Help needed standing up from a chair using your arms (e.g., wheelchair or bedside chair)?: A Lot Help needed to walk in hospital room?: Total Help needed climbing 3-5 steps with a railing? : Total 6 Click Score: 10    End of Session Equipment Utilized During Treatment: Gait belt Activity Tolerance: Patient limited by pain Patient left: in bed;with call bell/phone within reach;with bed alarm set;with SCD's reapplied Nurse Communication: Mobility status PT Visit Diagnosis: Muscle weakness (generalized) (M62.81);Other abnormalities of gait and mobility (R26.89);Pain;Unsteadiness on feet (R26.81);History of falling (Z91.81) Pain - Right/Left: Left Pain - part of body: Hip     Time: 1540-0867 PT Time Calculation (min) (ACUTE ONLY): 23 min  Charges:  $Therapeutic Activity: 23-37 mins                      Patrina Levering PT, DPT 03/31/21 4:16 PM [phone]

## 2021-03-31 NOTE — Progress Notes (Signed)
°  Subjective: 3 Days Post-Op Procedure(s) (LRB): CANNULATED HIP PINNING (Left) Patient reports pain as mild Patient is well, and has had no acute complaints or problems Plan is to go Rehab after hospital stay. Negative for chest pain and shortness of breath Fever: no Gastrointestinal: Negative for nausea and vomiting MRI of the brain negative for any acute intracranial process but found to have a aneurysm, CTA ordered showing right subsegmental PE in the right upper lobe  Objective: Vital signs in last 24 hours: Temp:  [98.1 F (36.7 C)-99.3 F (37.4 C)] 98.3 F (36.8 C) (01/30 0804) Pulse Rate:  [62-73] 63 (01/30 0804) Resp:  [15-20] 15 (01/30 0804) BP: (143-166)/(71-80) 164/77 (01/30 0804) SpO2:  [97 %-98 %] 98 % (01/30 0804)  Intake/Output from previous day:  Intake/Output Summary (Last 24 hours) at 03/31/2021 1154 Last data filed at 03/31/2021 1100 Gross per 24 hour  Intake 740.33 ml  Output 1400 ml  Net -659.67 ml    Intake/Output this shift: Total I/O In: -  Out: 450 [Urine:450]  Labs: Recent Labs    03/29/21 0444  HGB 9.6*   Recent Labs    03/29/21 0444  WBC 10.4  RBC 3.00*  HCT 29.5*  PLT 241   Recent Labs    03/29/21 0444 03/31/21 0427  NA 140 136  K 4.2 3.7  CL 106 103  CO2 26 26  BUN 23 21  CREATININE 1.14* 0.98  GLUCOSE 129* 138*  CALCIUM 8.9 9.4   No results for input(s): LABPT, INR in the last 72 hours.   EXAM General - Patient is Alert and mildly confused Extremity - Sensation intact distally Dorsiflexion/Plantar flexion intact Compartment soft Dressing/Incision - clean, dry, no drainage Motor Function - intact, moving foot and toes well on exam.   Past Medical History:  Diagnosis Date   Anxiety    Depression    Hypercholesterolemia    Knee pain    Uterine cancer (HCC)     Assessment/Plan: 3 Days Post-Op Procedure(s) (LRB): CANNULATED HIP PINNING (Left) Principal Problem:   Closed left hip fracture (HCC) Active  Problems:   Essential hypertension   Coronary artery disease due to lipid rich plaque   Simple chronic bronchitis (HCC)   Smoker   DNR (do not resuscitate)/DNI(Do Not Intubate)   Brain aneurysm   AKI (acute kidney injury) ([place])  Estimated body mass index is 27.46 kg/m as calculated from the following:   Height as of this encounter: 5\' 3"  (1.6 m).   Weight as of this encounter: 70.3 kg. Advance diet Up with therapy Delirium -mental status improving based on family member at bedside.  Continue to limit narcotics. Left hip pain improved.  Swelling improving. Follow-up with [facility] in 2 weeks  Appreciate internal medicine input in regards to left subsegmental PE/brain aneurysm.    Care management to assist with discharge to rehab  DVT Prophylaxis - Lovenox and compression hose Weight-Bearing as tolerated to left leg  T. Rachelle Hora, PA-C Orthopaedic Surgery 03/31/2021, 11:54 AM

## 2021-04-01 ENCOUNTER — Encounter: Payer: Self-pay | Admitting: Internal Medicine

## 2021-04-01 ENCOUNTER — Inpatient Hospital Stay: Payer: Medicare Other

## 2021-04-01 MED ORDER — HYDROCODONE-ACETAMINOPHEN 5-325 MG PO TABS: 1.0000 | ORAL_TABLET | Freq: Four times a day (QID) | ORAL | 0 refills | Status: DC | PRN

## 2021-04-01 MED ORDER — IOHEXOL 350 MG/ML SOLN
75.0000 mL | Freq: Once | INTRAVENOUS | Status: AC | PRN
  Administered 2021-04-01: 75 mL via INTRAVENOUS

## 2021-04-01 MED ORDER — ENSURE ENLIVE PO LIQD: 237.0000 mL | Freq: Two times a day (BID) | ORAL | 0 refills | Status: DC

## 2021-04-01 MED ORDER — ENOXAPARIN SODIUM 40 MG/0.4ML IJ SOSY: 40.0000 mg | PREFILLED_SYRINGE | INTRAMUSCULAR | Status: DC

## 2021-04-01 MED ORDER — ALPRAZOLAM 0.5 MG PO TABS: 0.2500 mg | ORAL_TABLET | Freq: Every evening | ORAL | 0 refills | Status: DC | PRN

## 2021-04-01 NOTE — TOC Transition Note (Signed)
Transition of Care [facility]) - CM/SW Discharge Note   Patient Details  Name: Carrieann Spielberg MRN: [id number] Date of Birth: [birth year]  Transition of Care [facility]) CM/SW Contact:  Eileen Stanford, LCSW Phone Number: 04/01/2021, 2:12 PM   Clinical Narrative:   Clinical Social Worker facilitated patient discharge including contacting patient family and facility to confirm patient discharge plans.  Clinical information faxed to facility and family agreeable with plan.  CSW arranged ambulance transport via [facility] to [facility]  .  RN to call [phone] for report prior to discharge.     Final next level of care: Skilled Nursing Facility Barriers to Discharge: No Barriers Identified   Patient Goals and CMS Choice        Discharge Placement              Patient chooses bed at:  ([facility]) Patient to be transferred to facility by: [facility]   Patient and family notified of of transfer: 04/01/21  Discharge Plan and Services                                     Social Determinants of Health (SDOH) Interventions     Readmission Risk Interventions No flowsheet data found.

## 2021-04-01 NOTE — Care Management Important Message (Signed)
Important Message  Patient Details  Name: Chelsea Hart MRN: [id number] Date of Birth: 1933/12/20   Medicare Important Message Given:  Yes     Juliann Pulse A Adalay Azucena 04/01/2021, 12:40 PM

## 2021-04-01 NOTE — TOC Progression Note (Addendum)
Transition of Care [facility]) - Progression Note    Patient Details  Name: Chelsea Hart MRN: [id number] Date of Birth: May 14, 1933  Transition of Care [facility]) CM/SW Contact  Eileen Stanford, LCSW Phone Number: 04/01/2021, 8:49 AM  Clinical Narrative:   Auth obtained by [facility] notified. Per Tammy at [facility] pt does not need a new Covid test.         Expected Discharge Plan and Services                                                 Social Determinants of Health (SDOH) Interventions    Readmission Risk Interventions No flowsheet data found.

## 2021-04-01 NOTE — Discharge Summary (Addendum)
Physician Discharge Summary  Patient ID: Chelsea Hart MRN: [id number] DOB/AGE: Aug 02, 1933 88 y.o.  Admit date: 03/27/2021 Discharge date: 04/01/2021  Admission Diagnoses:  Discharge Diagnoses:  Principal Problem:   Closed left hip fracture [facility]) Active Problems:   Essential hypertension   Coronary artery disease due to lipid rich plaque   Simple chronic bronchitis (HCC)   Smoker   DNR (do not resuscitate)/DNI(Do Not Intubate)   Brain aneurysm   AKI (acute kidney injury) [facility])   Discharged Condition: good  Hospital Course:   88 year old female who is from [place] who is a here visiting her son.  She also dropped off a friend in [place].  Patient still driving.  She lives independently.  She states that she fell at her son's house yesterday morning.  She was able to get back up.  She states that her left leg gave out from her when she was walking.   X-ray showed a left hip fracture.  Hip pinning was performed on 1/27. Patient developed facial droop probably from pain medicine 1/28.  MRI brain had incidental finding of brain aneurysm, possible venous thrombus.  Seen by neurology, CT angiogram and CT venogram of the head showed 8 mm left PICA aneurysm, subsegmental PE at right upper lobe.  No evidence of venous thrombosis.  Brain aneurysm. This is an incidental finding.  Patient does has a risk of a brain bleed.  Discussed with neurology, patient will follow-up with neurosurgery as outpatient.  Anticoagulation could possibly increase the risk of a bleeding.  Pulmonary emboli. CT angiogram of the chest showed 1x right upper lobe, 1x right lower lobe and 1X left lower lobe multiple segmental or subsegmental PEs, no DVT. Discussed with the patient daughter, decision was made not to treat this small clots due to risk of intracranial hemorrhage.  Patient will be monitored for DVT in the legs.   Left hip fracture. Status post the surgery. Patient doing better today.   Pain under control.  Appetite is still poor, but gradually improving.  Acute kidney injury. Chronic kidney disease stage IIIa ruled out. Patient had elevated creatinine at time admission, which has resolved since admission.  Condition is consistent with AKI instead of CKD.  Essential hypertension  Continue beta-blocker.  Coronary artery disease. Continue Lopressor and Zocor.  Chronic bronchitis Tobacco abuse. Advised to quit.   Neck pain. Secondary to arthritis.  CT scan negative.  Continue pain medicine and the Lidoderm patch.  Consults: neurology and orthopedic surgery  Significant Diagnostic Studies:   Treatments: Hip surgery  Discharge Exam: Blood pressure (!) 141/66, pulse (!) 53, temperature 97.6 F (36.4 C), resp. rate 18, height 5\' 3"  (1.6 m), weight 70.3 kg, SpO2 96 %. General appearance: alert and cooperative Resp: clear to auscultation bilaterally Cardio: regular rate and rhythm, S1, S2 normal, no murmur, click, rub or gallop GI: soft, non-tender; bowel sounds normal; no masses,  no organomegaly Extremities: extremities normal, atraumatic, no cyanosis or edema  Disposition: Discharge disposition: 03-Skilled Nursing Facility       Discharge Instructions     Diet - low sodium heart healthy   Complete by: As directed    Discharge wound care:   Complete by: As directed    Follow with RN   Increase activity slowly   Complete by: As directed       Allergies as of 04/01/2021       Reactions   Penicillins         Medication List  TAKE these medications    ALPRAZolam 0.5 MG tablet Commonly known as: XANAX Take 0.5 tablets (0.25 mg total) by mouth at bedtime as needed for sleep.   ascorbic acid 500 MG tablet Commonly known as: VITAMIN C Take 500 mg by mouth daily.   aspirin 81 MG EC tablet Take 81 mg by mouth daily.   calcium carbonate 1500 (600 Ca) MG Tabs tablet Commonly known as: OSCAL Take 1 tablet by mouth daily.    Cholecalciferol 50 MCG (2000 UT) Tabs Take 2,000 Units by mouth daily.   diphenhydrAMINE 25 MG tablet Commonly known as: BENADRYL Take 25 mg by mouth at bedtime as needed for sleep.   enoxaparin 40 MG/0.4ML injection Commonly known as: LOVENOX Inject 0.4 mLs (40 mg total) into the skin daily for 14 days. Start taking on: April 02, 2021   feeding supplement Liqd Take 237 mLs by mouth 2 (two) times daily between meals.   fenofibrate 54 MG tablet Take 54 mg by mouth daily.   HYDROcodone-acetaminophen 5-325 MG tablet Commonly known as: NORCO/VICODIN Take 1-2 tablets by mouth every 6 (six) hours as needed for moderate pain.   latanoprost 0.005 % ophthalmic solution Commonly known as: XALATAN Place 1 drop into both eyes at bedtime.   loratadine 10 MG tablet Commonly known as: CLARITIN Take 10 mg by mouth daily as needed for allergies.   metoprolol tartrate 25 MG tablet Commonly known as: LOPRESSOR Take 25 mg by mouth 2 (two) times daily.   PARoxetine 40 MG tablet Commonly known as: PAXIL Take 40 mg by mouth daily.   simvastatin 20 MG tablet Commonly known as: ZOCOR Take 20 mg by mouth at bedtime.               Discharge Care Instructions  (From admission, onward)           Start     Ordered   04/01/21 0000  Discharge wound care:       Comments: Follow with RN   04/01/21 1212            Contact information for follow-up providers     Meade Maw, MD Follow up in 1 month(s).   Specialty: Neurosurgery Contact information: [place] [address] [phone]         Hessie Knows, MD Follow up in 2 week(s).   Specialty: Orthopedic Surgery Contact information: [place] [address] [phone]              Contact information for after-discharge care     Destination     [facility] Preferred SNF .   Service: Skilled  Nursing Contact information: [address] [place] [phone]                    32 minutes Signed: Sharen Hones 04/01/2021, 12:12 PM

## 2021-04-01 NOTE — Progress Notes (Addendum)
°  Subjective: 4 Days Post-Op Procedure(s) (LRB): CANNULATED HIP PINNING (Left) Patient reports pain as mild. Mental status improving. Patient is well, and has had no acute complaints or problems Plan is to go Rehab after hospital stay. Negative for chest pain and shortness of breath Fever: no Gastrointestinal: Negative for nausea and vomiting MRI of the brain negative for any acute intracranial process but found to have a aneurysm, CTA ordered showing right subsegmental PE in the right upper lobe  Objective: Vital signs in last 24 hours: Temp:  [97.4 F (36.3 C)-99 F (37.2 C)] 97.6 F (36.4 C) (01/31 0845) Pulse Rate:  [53-73] 53 (01/31 0845) Resp:  [16-18] 18 (01/31 0845) BP: (141-172)/(66-78) 141/66 (01/31 0845) SpO2:  [95 %-99 %] 96 % (01/31 0845)  Intake/Output from previous day:  Intake/Output Summary (Last 24 hours) at 04/01/2021 0932 Last data filed at 04/01/2021 0523 Gross per 24 hour  Intake 360 ml  Output 850 ml  Net -490 ml    Intake/Output this shift: No intake/output data recorded.  Labs: No results for input(s): HGB in the last 72 hours.  No results for input(s): WBC, RBC, HCT, PLT in the last 72 hours.  Recent Labs    03/31/21 0427  NA 136  K 3.7  CL 103  CO2 26  BUN 21  CREATININE 0.98  GLUCOSE 138*  CALCIUM 9.4   No results for input(s): LABPT, INR in the last 72 hours.   EXAM General - Patient is Alert and mildly confused Extremity - Sensation intact distally Dorsiflexion/Plantar flexion intact Compartment soft Dressing/Incision - clean, dry, no drainage Motor Function - intact, moving foot and toes well on exam.   Past Medical History:  Diagnosis Date   Anxiety    Depression    Hypercholesterolemia    Knee pain    Uterine cancer (HCC)     Assessment/Plan: 4 Days Post-Op Procedure(s) (LRB): CANNULATED HIP PINNING (Left) Principal Problem:   Closed left hip fracture (HCC) Active Problems:   Essential hypertension   Coronary  artery disease due to lipid rich plaque   Simple chronic bronchitis (HCC)   Smoker   DNR (do not resuscitate)/DNI(Do Not Intubate)   Brain aneurysm   AKI (acute kidney injury) ([place])  Estimated body mass index is 27.46 kg/m as calculated from the following:   Height as of this encounter: 5\' 3"  (1.6 m).   Weight as of this encounter: 70.3 kg. Advance diet Up with therapy Delirium -mental status continuing to improve based on family member at bedside.  Continue to limit narcotics. Left hip pain improved.  Swelling improving. CM to assist with discharge to SNF  Follow-up with [facility] in 2 weeks Lovenox 40 mg SQ daily x 14 days at discharge  Appreciate internal medicine input in regards to left subsegmental PE/brain aneurysm.     DVT Prophylaxis - Lovenox and compression hose Weight-Bearing as tolerated to left leg  T. Rachelle Hora, PA-C Orthopaedic Surgery 04/01/2021, 9:32 AM

## 2021-04-01 NOTE — Plan of Care (Addendum)
Neurology plan of care  MRI brain showed no acute infarct. CTV showed no e/o venous sinus thrombosis. CTA H&N confirmed incidental finding of 42mm L PICA aneurysm. Patient was further noted to have incidental finding of distal segmental and subsegmental pulmonary emboli on CTA neck. I discussed this finding with Dr. Roosevelt Locks and told him that anticoagulation in this patient given the location and size of her aneurysm could increase the risk of rupture and worsen SAH in the event of rupture, but that if she required anticoagulation and the benefit outweighed the risk then it should be started. Need for anticoagulation is not considered an indication to secure the aneurysm. He felt that she did not require anticoagulation at this time for the finding of subsegmental PE, but that he would perform a CTA chest in a few days (2/2 recent contrast administration). She may f/u with neurosurgery outpatient for the aneurysm. She does not need to f/u with outpatient neurology at this time. No further inpatient neurologic workup indicated at this time. Neurology will sign off, but please re-engage if additional questions arise.  Su Monks, MD [facility] [phone]  If 7pm- 7am, please page neurology on call as listed in [place].

## 2021-04-01 NOTE — Progress Notes (Signed)
Physical Therapy Treatment Patient Details Name: Chelsea Hart MRN: [id number] DOB: 1934-02-09 Today's Date: 04/01/2021   History of Present Illness 88 year old female who is from [place] who is a here visiting her son. Patient reports she drove herself from her home in [place] to [place]. Sister present and reports patient had a fall prior to getting to her son's house and then fell again coming out of the bathroom at her son's. She lives independently. Xrays revealed a hip fracture, pt underwent surgery for left cannulated hip pinning.    PT Comments    Patient tolerated session well and was agreeable to treatment. Upon arrival patient was supine with HOB elevated and family present. No pain reported throughout session. Patient is very motivated to perform all activities on her own. She demonstrated improved independence with bed mobility, transfers, and gait. Patient was able to complete supine to sitting with CGA/ L HHA. From a slightly elevated surface patient was able to complete sit to stand MinA-ModA with cueing on hand placement. Patient was very admitted to try and attempt without assistance first. Able to initiate, however required assistance to complete. She also demonstrated improved tolerance with walking, ambulating to doorway of patient's room and back at [place]. Patient is demonstrating improvements towards her goals and would continue to benefit from skilled physical therapy to optimize patient's return to PLOF. Continue to recommend STR upon discharge from acute hospitalization.    Recommendations for follow up therapy are one component of a multi-disciplinary discharge planning process, led by the attending physician.  Recommendations may be updated based on patient status, additional functional criteria and insurance authorization.  Follow Up Recommendations  Skilled nursing-short term rehab (<3 hours/day)     Assistance Recommended at Discharge Frequent or  constant Supervision/Assistance  Patient can return home with the following Two people to help with walking and/or transfers;Two people to help with bathing/dressing/bathroom;Assistance with cooking/housework;Direct supervision/assist for medications management;Assist for transportation;Direct supervision/assist for financial management;Assistance with feeding;Help with stairs or ramp for entrance   Equipment Recommendations  Other (comment) (defer to next level of care)    Recommendations for Other Services       Precautions / Restrictions Precautions Precautions: Fall Restrictions Weight Bearing Restrictions: Yes LLE Weight Bearing: Weight bearing as tolerated     Mobility  Bed Mobility Overal bed mobility: Needs Assistance Bed Mobility: Sit to Supine     Supine to sit: Min guard (L HHA to pull up into sitting; RUE utilized bed rail)          Transfers Overall transfer level: Needs assistance Equipment used: Rolling walker (2 wheels) Transfers: Sit to/from Stand Sit to Stand: Min assist, Mod assist (patient was admittent to attempt without any assistance, was able to initiate from low surface, however required assistance to complete)                Ambulation/Gait Ambulation/Gait assistance: Min guard Gait Distance (Feet): 25 Feet Assistive device: Rolling walker (2 wheels) Gait Pattern/deviations: Step-to pattern, Decreased step length - right, Decreased step length - left, Trunk flexed, Narrow base of support, Decreased weight shift to left Gait velocity: decreased     General Gait Details: patient educated on weight-bearing precaution; patient ambulates with decreased cadence; no major LOB noted; intermittent standing recovery breaks; patient was able to reach outside BOS with RUE to grab water with no LOB or unsteadiness   Chief Strategy Officer  Modified Rankin (Stroke Patients Only)       Balance Overall balance  assessment: Needs assistance Sitting-balance support: Feet supported, Bilateral upper extremity supported Sitting balance-Leahy Scale: Fair   Postural control: Posterior lean, Right lateral lean Standing balance support: Bilateral upper extremity supported, Reliant on assistive device for balance Standing balance-Leahy Scale: Fair Standing balance comment: heavy reliance upon UE's for standing; is able to reach outside BOS to grab water off table with no LOB noted                            Cognition Arousal/Alertness: Awake/alert Behavior During Therapy: WFL for tasks assessed/performed (making jokes with sister) Overall Cognitive Status: Impaired/Different from baseline                                 General Comments: A&Ox3 situation, location, year        Exercises      General Comments        Pertinent Vitals/Pain Pain Assessment Pain Assessment: No/denies pain Faces Pain Scale: Hurts a little bit Pain Location: L hip Pain Descriptors / Indicators: Grimacing Pain Intervention(s): Limited activity within patient's tolerance, Monitored during session, Repositioned    Home Living                          Prior Function            PT Goals (current goals can now be found in the care plan section) Acute Rehab PT Goals Patient Stated Goal: to be as independent as I can be PT Goal Formulation: With patient Time For Goal Achievement: 04/12/21 Potential to Achieve Goals: Fair Progress towards PT goals: Progressing toward goals    Frequency    7X/week      PT Plan Current plan remains appropriate    Co-evaluation              AM-PAC PT "6 Clicks" Mobility   Outcome Measure  Help needed turning from your back to your side while in a flat bed without using bedrails?: A Little Help needed moving from lying on your back to sitting on the side of a flat bed without using bedrails?: A Little Help needed moving to and  from a bed to a chair (including a wheelchair)?: A Little Help needed standing up from a chair using your arms (e.g., wheelchair or bedside chair)?: A Little Help needed to walk in hospital room?: A Little Help needed climbing 3-5 steps with a railing? : A Lot 6 Click Score: 17    End of Session Equipment Utilized During Treatment: Gait belt Activity Tolerance: Patient limited by pain Patient left: in bed;with call bell/phone within reach;with family/visitor present (sitting EOB eating breakfast) Nurse Communication: Mobility status PT Visit Diagnosis: Muscle weakness (generalized) (M62.81);Other abnormalities of gait and mobility (R26.89);Pain;Unsteadiness on feet (R26.81);History of falling (Z91.81) Pain - Right/Left: Left Pain - part of body: Hip     Time: 9628-3662 PT Time Calculation (min) (ACUTE ONLY): 24 min  Charges:  $Gait Training: 23-37 mins                     Iva Boop, PT  04/01/21. 10:27 AM

## 2021-04-11 DIAGNOSIS — E782 Mixed hyperlipidemia: Secondary | ICD-10-CM | POA: Insufficient documentation

## 2021-04-11 DIAGNOSIS — E559 Vitamin D deficiency, unspecified: Secondary | ICD-10-CM | POA: Insufficient documentation

## 2021-04-16 ENCOUNTER — Inpatient Hospital Stay
Admission: EM | Admit: 2021-04-16 | Discharge: 2021-04-18 | DRG: 064 | Disposition: A | Discharge status: Home Health | Payer: Medicare Other | Attending: Internal Medicine | Admitting: Internal Medicine

## 2021-04-16 ENCOUNTER — Inpatient Hospital Stay: Payer: Medicare Other

## 2021-04-16 ENCOUNTER — Emergency Department: Payer: Medicare Other

## 2021-04-16 ENCOUNTER — Other Ambulatory Visit: Payer: Self-pay

## 2021-04-16 DIAGNOSIS — F419 Anxiety disorder, unspecified: Secondary | ICD-10-CM | POA: Diagnosis present

## 2021-04-16 DIAGNOSIS — N289 Disorder of kidney and ureter, unspecified: Secondary | ICD-10-CM

## 2021-04-16 DIAGNOSIS — I129 Hypertensive chronic kidney disease with stage 1 through stage 4 chronic kidney disease, or unspecified chronic kidney disease: Secondary | ICD-10-CM | POA: Diagnosis not present

## 2021-04-16 DIAGNOSIS — Z90710 Acquired absence of both cervix and uterus: Secondary | ICD-10-CM

## 2021-04-16 DIAGNOSIS — R4701 Aphasia: Secondary | ICD-10-CM | POA: Diagnosis not present

## 2021-04-16 DIAGNOSIS — E042 Nontoxic multinodular goiter: Secondary | ICD-10-CM | POA: Diagnosis not present

## 2021-04-16 DIAGNOSIS — R4781 Slurred speech: Secondary | ICD-10-CM

## 2021-04-16 DIAGNOSIS — E7800 Pure hypercholesterolemia, unspecified: Secondary | ICD-10-CM | POA: Diagnosis not present

## 2021-04-16 DIAGNOSIS — Z20822 Contact with and (suspected) exposure to covid-19: Secondary | ICD-10-CM | POA: Diagnosis present

## 2021-04-16 DIAGNOSIS — I700 Atherosclerosis of aorta: Secondary | ICD-10-CM | POA: Diagnosis not present

## 2021-04-16 DIAGNOSIS — I639 Cerebral infarction, unspecified: Secondary | ICD-10-CM | POA: Diagnosis not present

## 2021-04-16 DIAGNOSIS — I611 Nontraumatic intracerebral hemorrhage in hemisphere, cortical: Secondary | ICD-10-CM | POA: Diagnosis present

## 2021-04-16 DIAGNOSIS — R471 Dysarthria and anarthria: Secondary | ICD-10-CM | POA: Diagnosis not present

## 2021-04-16 DIAGNOSIS — I10 Essential (primary) hypertension: Secondary | ICD-10-CM | POA: Diagnosis present

## 2021-04-16 DIAGNOSIS — Z86711 Personal history of pulmonary embolism: Secondary | ICD-10-CM | POA: Diagnosis not present

## 2021-04-16 DIAGNOSIS — F32A Depression, unspecified: Secondary | ICD-10-CM | POA: Diagnosis not present

## 2021-04-16 DIAGNOSIS — F17210 Nicotine dependence, cigarettes, uncomplicated: Secondary | ICD-10-CM | POA: Diagnosis present

## 2021-04-16 DIAGNOSIS — Z66 Do not resuscitate: Secondary | ICD-10-CM | POA: Diagnosis present

## 2021-04-16 DIAGNOSIS — N1831 Chronic kidney disease, stage 3a: Secondary | ICD-10-CM | POA: Diagnosis not present

## 2021-04-16 DIAGNOSIS — Z7982 Long term (current) use of aspirin: Secondary | ICD-10-CM

## 2021-04-16 DIAGNOSIS — Z8542 Personal history of malignant neoplasm of other parts of uterus: Secondary | ICD-10-CM | POA: Diagnosis not present

## 2021-04-16 DIAGNOSIS — I2699 Other pulmonary embolism without acute cor pulmonale: Secondary | ICD-10-CM | POA: Diagnosis not present

## 2021-04-16 DIAGNOSIS — Z880 Allergy status to penicillin: Secondary | ICD-10-CM | POA: Diagnosis not present

## 2021-04-16 DIAGNOSIS — Z716 Tobacco abuse counseling: Secondary | ICD-10-CM

## 2021-04-16 DIAGNOSIS — I63412 Cerebral infarction due to embolism of left middle cerebral artery: Principal | ICD-10-CM | POA: Diagnosis present

## 2021-04-16 DIAGNOSIS — E7849 Other hyperlipidemia: Secondary | ICD-10-CM

## 2021-04-16 DIAGNOSIS — R29810 Facial weakness: Secondary | ICD-10-CM | POA: Diagnosis present

## 2021-04-16 DIAGNOSIS — I671 Cerebral aneurysm, nonruptured: Secondary | ICD-10-CM | POA: Diagnosis present

## 2021-04-16 DIAGNOSIS — R29701 NIHSS score 1: Secondary | ICD-10-CM | POA: Diagnosis present

## 2021-04-16 DIAGNOSIS — Z7901 Long term (current) use of anticoagulants: Secondary | ICD-10-CM | POA: Diagnosis not present

## 2021-04-16 DIAGNOSIS — N183 Chronic kidney disease, stage 3 unspecified: Secondary | ICD-10-CM | POA: Diagnosis present

## 2021-04-16 DIAGNOSIS — Z79899 Other long term (current) drug therapy: Secondary | ICD-10-CM

## 2021-04-16 LAB — CBC
HCT: 33.5 % — ABNORMAL LOW (ref 36.0–46.0)
Hemoglobin: 10.3 g/dL — ABNORMAL LOW (ref 12.0–15.0)
MCH: 31.1 pg (ref 26.0–34.0)
MCHC: 30.7 g/dL (ref 30.0–36.0)
MCV: 101.2 fL — ABNORMAL HIGH (ref 80.0–100.0)
Platelets: 426 10*3/uL — ABNORMAL HIGH (ref 150–400)
RBC: 3.31 MIL/uL — ABNORMAL LOW (ref 3.87–5.11)
RDW: 13.3 % (ref 11.5–15.5)
WBC: 7.9 10*3/uL (ref 4.0–10.5)
nRBC: 0 % (ref 0.0–0.2)

## 2021-04-16 LAB — ETHANOL: Alcohol, Ethyl (B): <10 mg/dL (ref <10)

## 2021-04-16 LAB — DIFFERENTIAL
Abs Immature Granulocytes: 0.03 10*3/uL (ref 0.00–0.07)
Basophils Absolute: 0.1 10*3/uL (ref 0.0–0.1)
Basophils Relative: 1 %
Eosinophils Absolute: 0.2 10*3/uL (ref 0.0–0.5)
Eosinophils Relative: 2 %
Immature Granulocytes: 0 %
Lymphocytes Relative: 31 %
Lymphs Abs: 2.4 10*3/uL (ref 0.7–4.0)
Monocytes Absolute: 0.6 10*3/uL (ref 0.1–1.0)
Monocytes Relative: 8 %
Neutro Abs: 4.6 10*3/uL (ref 1.7–7.7)
Neutrophils Relative %: 58 %

## 2021-04-16 LAB — COMPREHENSIVE METABOLIC PANEL
ALT: 13 U/L (ref 0–44)
AST: 20 U/L (ref 15–41)
Albumin: 3.7 g/dL (ref 3.5–5.0)
Alkaline Phosphatase: 88 U/L (ref 38–126)
Anion gap: 7 (ref 5–15)
BUN: 17 mg/dL (ref 8–23)
CO2: 24 mmol/L (ref 22–32)
Calcium: 9.8 mg/dL (ref 8.9–10.3)
Chloride: 107 mmol/L (ref 98–111)
Creatinine, Ser: 0.95 mg/dL (ref 0.44–1.00)
GFR, Estimated: 59 mL/min — ABNORMAL LOW (ref >60)
Glucose, Bld: 127 mg/dL — ABNORMAL HIGH (ref 70–99)
Potassium: 3.7 mmol/L (ref 3.5–5.1)
Sodium: 138 mmol/L (ref 135–145)
Total Bilirubin: 0.6 mg/dL (ref 0.3–1.2)
Total Protein: 6.6 g/dL (ref 6.5–8.1)

## 2021-04-16 LAB — PROTIME-INR
INR: 1 (ref 0.8–1.2)
Prothrombin Time: 12.8 seconds (ref 11.4–15.2)

## 2021-04-16 LAB — RESP PANEL BY RT-PCR (FLU A&B, COVID) ARPGX2
Influenza A by PCR: NEGATIVE
Influenza B by PCR: NEGATIVE
SARS Coronavirus 2 by RT PCR: NEGATIVE

## 2021-04-16 LAB — APTT: aPTT: 22 seconds — ABNORMAL LOW (ref 24–36)

## 2021-04-16 LAB — CBG MONITORING, ED: Glucose-Capillary: 95 mg/dL (ref 70–99)

## 2021-04-16 MED ORDER — ACETAMINOPHEN 160 MG/5ML PO SOLN
650.0000 mg | ORAL | Status: DC | PRN
  Filled 2021-04-16: qty 20.3

## 2021-04-16 MED ORDER — CALCIUM CARBONATE 1250 (500 CA) MG PO TABS
1.0000 | ORAL_TABLET | Freq: Every day | ORAL | Status: DC
  Administered 2021-04-17 – 2021-04-18 (×2): 500 mg via ORAL
  Filled 2021-04-16 (×2): qty 1

## 2021-04-16 MED ORDER — IOHEXOL 350 MG/ML SOLN
75.0000 mL | Freq: Once | INTRAVENOUS | Status: AC | PRN
  Administered 2021-04-16: 75 mL via INTRAVENOUS

## 2021-04-16 MED ORDER — FENOFIBRATE 54 MG PO TABS
54.0000 mg | ORAL_TABLET | Freq: Every day | ORAL | Status: DC
  Administered 2021-04-17 – 2021-04-18 (×2): 54 mg via ORAL
  Filled 2021-04-16 (×3): qty 1

## 2021-04-16 MED ORDER — LATANOPROST 0.005 % OP SOLN
1.0000 [drp] | Freq: Every day | OPHTHALMIC | Status: DC
  Administered 2021-04-16 – 2021-04-17 (×2): 1 [drp] via OPHTHALMIC
  Filled 2021-04-16 (×2): qty 2.5

## 2021-04-16 MED ORDER — VITAMIN D 25 MCG (1000 UNIT) PO TABS
2000.0000 [IU] | ORAL_TABLET | Freq: Every day | ORAL | Status: DC
  Administered 2021-04-17 – 2021-04-18 (×2): 2000 [IU] via ORAL
  Filled 2021-04-16 (×3): qty 2

## 2021-04-16 MED ORDER — ATORVASTATIN CALCIUM 20 MG PO TABS
80.0000 mg | ORAL_TABLET | Freq: Every day | ORAL | Status: DC
  Administered 2021-04-17 – 2021-04-18 (×2): 80 mg via ORAL
  Filled 2021-04-16 (×2): qty 4

## 2021-04-16 MED ORDER — ENSURE ENLIVE PO LIQD
237.0000 mL | Freq: Two times a day (BID) | ORAL | Status: DC
  Administered 2021-04-17 (×2): 237 mL via ORAL

## 2021-04-16 MED ORDER — MELATONIN 5 MG PO TABS
5.0000 mg | ORAL_TABLET | Freq: Every day | ORAL | Status: DC
  Administered 2021-04-16 – 2021-04-17 (×2): 5 mg via ORAL
  Filled 2021-04-16 (×2): qty 1

## 2021-04-16 MED ORDER — ALPRAZOLAM 0.25 MG PO TABS: 0.2500 mg | ORAL_TABLET | Freq: Every evening | ORAL | Status: DC | PRN

## 2021-04-16 MED ORDER — ASCORBIC ACID 500 MG PO TABS
500.0000 mg | ORAL_TABLET | Freq: Every day | ORAL | Status: DC
  Administered 2021-04-17 – 2021-04-18 (×2): 500 mg via ORAL
  Filled 2021-04-16 (×2): qty 1

## 2021-04-16 MED ORDER — POLYVINYL ALCOHOL 1.4 % OP SOLN
1.0000 [drp] | Freq: Two times a day (BID) | OPHTHALMIC | Status: DC | PRN
  Filled 2021-04-16: qty 15

## 2021-04-16 MED ORDER — SENNOSIDES-DOCUSATE SODIUM 8.6-50 MG PO TABS
1.0000 | ORAL_TABLET | Freq: Every day | ORAL | Status: DC
  Administered 2021-04-18: 09:00:00 1 via ORAL
  Filled 2021-04-16: qty 1

## 2021-04-16 MED ORDER — ASPIRIN 300 MG RE SUPP: 300.0000 mg | Freq: Every day | RECTAL | Status: DC

## 2021-04-16 MED ORDER — PAROXETINE HCL 20 MG PO TABS
40.0000 mg | ORAL_TABLET | Freq: Every day | ORAL | Status: DC
  Administered 2021-04-17 – 2021-04-18 (×2): 40 mg via ORAL
  Filled 2021-04-16 (×3): qty 2

## 2021-04-16 MED ORDER — ACETAMINOPHEN 325 MG PO TABS: 650.0000 mg | ORAL_TABLET | ORAL | Status: DC | PRN

## 2021-04-16 MED ORDER — ACETAMINOPHEN 650 MG RE SUPP: 650.0000 mg | RECTAL | Status: DC | PRN

## 2021-04-16 MED ORDER — STROKE: EARLY STAGES OF RECOVERY BOOK: Freq: Once | Status: DC

## 2021-04-16 NOTE — H&P (Addendum)
History and Physical    Patient: Chelsea Hart TKP:[id number] DOB: 28-Apr-1933 DOA: 04/16/2021 DOS: the patient was seen and examined on 04/16/2021 PCP: Pcp, No  Patient coming from: Home  Chief Complaint:  Chief Complaint  Patient presents with   Code Stroke  Most of the history was obtained from ER notes  HPI: Chelsea Hart is a 88 y.o. female with medical history significant bilateral pulmonary emboli not on anticoagulation due to increased risk of bleeding, anxiety, depression, history of a PICA aneurysm who was brought into the ER as a code stroke. Patient's last known well was around 9 PM the night prior to admission and on the day of admission she was noted to have slurred speech by the nursing home staff who sent her to the ER for further evaluation. Patient was seen by neurology in the ER and was not a candidate for tPA because her last known well time was greater than 4 and half hours. I am unable to do a review of systems on this patient due to her dysarthria.  Review of Systems: Unable to do a review of systems on this patient due to her dysarthria Past Medical History:  Diagnosis Date   Anxiety    Depression    Hypercholesterolemia    Knee pain    Uterine cancer [facility])    Past Surgical History:  Procedure Laterality Date   ABDOMINAL HYSTERECTOMY     BREAST SURGERY     HIP PINNING,CANNULATED Left 03/28/2021   Procedure: CANNULATED HIP PINNING;  Surgeon: Hessie Knows, MD;  Location: [facility] ORS;  Service: Orthopedics;  Laterality: Left;   MIDDLE EAR SURGERY     NOSE SURGERY     Social History:  reports that she has been smoking cigarettes. She has been smoking an average of 1 pack per day. She does not have any smokeless tobacco history on file. She reports that she does not drink alcohol and does not use drugs.  Allergies  Allergen Reactions   Penicillins     History reviewed. No pertinent family history.  Prior to Admission medications   Medication Sig Start  Date End Date Taking? Authorizing Provider  ALPRAZolam Duanne Moron) 0.5 MG tablet Take 0.5 tablets (0.25 mg total) by mouth at bedtime as needed for sleep. 04/01/21  Yes Sharen Hones, MD  ascorbic acid (VITAMIN C) 500 MG tablet Take 500 mg by mouth daily.   Yes [provider]  aspirin 81 MG EC tablet Take 81 mg by mouth daily.   Yes [provider]  calcium carbonate (OSCAL) 1500 (600 Ca) MG TABS tablet Take 1 tablet by mouth daily.   Yes [provider]  Cholecalciferol 50 MCG (2000 UT) TABS Take 2,000 Units by mouth daily.   Yes [provider]  fenofibrate 54 MG tablet Take 54 mg by mouth daily.   Yes [provider]  latanoprost (XALATAN) 0.005 % ophthalmic solution Place 1 drop into both eyes at bedtime.   Yes [provider]  melatonin 3 MG TABS tablet Take 3 mg by mouth at bedtime.   Yes [provider]  metoprolol tartrate (LOPRESSOR) 25 MG tablet Take 25 mg by mouth 2 (two) times daily.   Yes [provider]  PARoxetine (PAXIL) 40 MG tablet Take 40 mg by mouth daily.   Yes [provider]  Polyethyl Glyc-Propyl Glyc PF 0.4-0.3 % SOLN Apply 1-2 drops to eye 2 (two) times daily as needed.   Yes [provider]  polyethylene glycol (  MIRALAX / GLYCOLAX) 17 g packet Take 17 g by mouth daily.   Yes [provider]  sennosides-docusate sodium (SENOKOT-S) 8.6-50 MG tablet Take 1 tablet by mouth daily.   Yes [provider]  simvastatin (ZOCOR) 20 MG tablet Take 20 mg by mouth at bedtime.   Yes [provider]  diphenhydrAMINE (BENADRYL) 25 MG tablet Take 25 mg by mouth at bedtime as needed for sleep.    [provider]  enoxaparin (LOVENOX) 40 MG/0.4ML injection Inject 0.4 mLs (40 mg total) into the skin daily for 14 days. Patient not taking: Reported on 04/16/2021 04/02/21 04/16/21  Sharen Hones, MD  feeding supplement (ENSURE ENLIVE / ENSURE PLUS) LIQD Take 237 mLs by mouth 2  (two) times daily between meals. 04/01/21   Sharen Hones, MD  HYDROcodone-acetaminophen (NORCO/VICODIN) 5-325 MG tablet Take 1-2 tablets by mouth every 6 (six) hours as needed for moderate pain. 04/01/21   Sharen Hones, MD  loratadine (CLARITIN) 10 MG tablet Take 10 mg by mouth daily as needed for allergies.    [provider]    Physical Exam: Vitals:   04/16/21 0953 04/16/21 1012 04/16/21 1030  BP:  (!) 172/65 (!) 161/69  Pulse:  (!) 58 (!) 53  Resp:  16 18  Temp:  97.9 F (36.6 C)   TempSrc:  Oral   SpO2:  99% 100%  Weight: 70.3 kg    Height: 5\' 3"  (1.6 m)     Physical Exam Vitals and nursing note reviewed.  Constitutional:      Appearance: Normal appearance.     Comments: Has dysarthria  HENT:     Head: Normocephalic and atraumatic.     Nose: Nose normal.     Mouth/Throat:     Mouth: Mucous membranes are moist.  Eyes:     Pupils: Pupils are equal, round, and reactive to light.  Cardiovascular:     Rate and Rhythm: Normal rate and regular rhythm.     Pulses: Normal pulses.  Pulmonary:     Effort: Pulmonary effort is normal.     Breath sounds: Normal breath sounds.  Abdominal:     General: Abdomen is flat. Bowel sounds are normal.     Palpations: Abdomen is soft.  Musculoskeletal:        General: Normal range of motion.     Cervical back: Normal range of motion.  Skin:    General: Skin is warm and dry.  Neurological:     Mental Status: She is alert.     Comments: Able to move her extremities.  Has dysarthria.  Psychiatric:     Comments: Unable to assess     Data Reviewed: Notes from primary care and specialist visits, past discharge summaries. Prior diagnostic testing as applicable to current admission diagnoses Updated medications and problem lists for reconciliation ED course, including vitals, labs, imaging, treatment and response to treatment Triage notes and ED providers notes CT scan of the head without contrast shows no acute intracranial  pathology CT angiogram of the head and neck was negative for LV0 but Positive for a new Left MCA M3 branch occlusion in the middle Sylvian division. 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 Stable carotid andatherosclerosis without significant stenosis.  There are no new results to review at this time.  Assessment and Plan: Principal Problem:   Acute CVA (cerebrovascular accident) ([place]) Active Problems:   CKD (chronic kidney disease) stage 3, GFR 30-59 ml/min (HCC)  Essential hypertension   Brain aneurysm   Depression   Pulmonary embolism ([place])    Acute CVA (POA) Patient presents for evaluation of slurred speech that was present when she woke up this morning as well as a facial droop Initial CT scan of the head without contrast is negative for bleed CT angiogram of the head and neck is negative for LVO but shows a new left MCA M3 branch occlusion in the middle sylvian division Patient was not a candidate for tPA Place patient on high intensity statins. Hold off on administering antiplatelets since patient received a dose of Lovenox on the day of admission Allow for permissive hypertension Obtain MRI of the brain without contrast Obtain 2D echocardiogram to assess LVEF and rule out cardiac thrombus We will request PT/OT/ST consult     History of pulmonary embolism Patient had a CT angiogram done on 04/01/21 which showed bilateral pulmonary emboli She was not placed on anticoagulation due to the fact that she has a cerebral aneurysm with increased risk for bleeding and rupture if placed on anticoagulation    Anxiety and depression Continue Paxil  Advance Care Planning:   Code Status: DNR   Consults: Neurology  Family Communication: Greater than 50% of time was spent discussing patient's condition and plan of care with her daughter, Otila Kluver over the phone.  All questions and concerns have been addressed.  She verbalizes  understanding and agrees with the plan.  CODE STATUS was discussed and patient has a MOST form when she indicated she wanted to be placed on a DO NOT RESUSCITATE status  Severity of Illness: The appropriate patient status for this patient is INPATIENT. Inpatient status is judged to be reasonable and necessary in order to provide the required intensity of service to ensure the patient's safety. The patient's presenting symptoms, physical exam findings, and initial radiographic and laboratory data in the context of their chronic comorbidities is felt to place them at high risk for further clinical deterioration. Furthermore, it is not anticipated that the patient will be medically stable for discharge from the hospital within 2 midnights of admission.   * I certify that at the point of admission it is my clinical judgment that the patient will require inpatient hospital care spanning beyond 2 midnights from the point of admission due to high intensity of service, high risk for further deterioration and high frequency of surveillance required.*  Author: Collier Bullock, MD 04/16/2021 11:25 AM  For on call review [email or web address].

## 2021-04-16 NOTE — ED Notes (Signed)
Pt in MRI.

## 2021-04-16 NOTE — ED Notes (Signed)
Dr. Francine Graven at bedside, verbal order for speech therapy to eval swallowing.

## 2021-04-16 NOTE — Consult Note (Signed)
Neurology Consult H&P  Chelsea Hart MR# [id number] 04/16/2021   CC: code stroke speech difficulty  History is obtained from: Patient and chart.  HPI: Chelsea Hart is a 88 y.o. female PMHx as reviewed below, recent pulmonary embolism on full dose LMWH went to bed normal 2100 and woke this am with slurred speech. She had waxing and waning symptoms which resolved by the time she reached the CT scanner.  She is currently in a rehabilitation facility.  LKW: 2100 last night tNK given: No OSW IR Thrombectomy No intervenable occlusion Modified Rankin Scale: 1-No significant post stroke disability and can perform usual duties with stroke symptoms NIHSS: 0  ROS: A complete ROS was performed and is negative except as noted in the HPI.   Past Medical History:  Diagnosis Date   Anxiety    Depression    Hypercholesterolemia    Knee pain    Uterine cancer ([place])    History reviewed. No pertinent family history.  Social History:  reports that she has been smoking cigarettes. She has been smoking an average of 1 pack per day. She does not have any smokeless tobacco history on file. She reports that she does not drink alcohol and does not use drugs.   Prior to Admission medications   Medication Sig Start Date End Date Taking? Authorizing Provider  ALPRAZolam Duanne Moron) 0.5 MG tablet Take 0.5 tablets (0.25 mg total) by mouth at bedtime as needed for sleep. 04/01/21   Sharen Hones, MD  ascorbic acid (VITAMIN C) 500 MG tablet Take 500 mg by mouth daily.    [provider]  aspirin 81 MG EC tablet Take 81 mg by mouth daily.    [provider]  calcium carbonate (OSCAL) 1500 (600 Ca) MG TABS tablet Take 1 tablet by mouth daily.    [provider]  Cholecalciferol 50 MCG (2000 UT) TABS Take 2,000 Units by mouth daily.    [provider]  diphenhydrAMINE (BENADRYL) 25 MG tablet Take 25 mg by mouth at bedtime as needed for sleep.    [provider]   enoxaparin (LOVENOX) 40 MG/0.4ML injection Inject 0.4 mLs (40 mg total) into the skin daily for 14 days. 04/02/21 04/16/21  Sharen Hones, MD  feeding supplement (ENSURE ENLIVE / ENSURE PLUS) LIQD Take 237 mLs by mouth 2 (two) times daily between meals. 04/01/21   Sharen Hones, MD  fenofibrate 54 MG tablet Take 54 mg by mouth daily.    [provider]  HYDROcodone-acetaminophen (NORCO/VICODIN) 5-325 MG tablet Take 1-2 tablets by mouth every 6 (six) hours as needed for moderate pain. 04/01/21   Sharen Hones, MD  latanoprost (XALATAN) 0.005 % ophthalmic solution Place 1 drop into both eyes at bedtime.    [provider]  loratadine (CLARITIN) 10 MG tablet Take 10 mg by mouth daily as needed for allergies.    [provider]  metoprolol tartrate (LOPRESSOR) 25 MG tablet Take 25 mg by mouth 2 (two) times daily.    [provider]  PARoxetine (PAXIL) 40 MG tablet Take 40 mg by mouth daily.    [provider]  simvastatin (ZOCOR) 20 MG tablet Take 20 mg by mouth at bedtime.    [provider]    Exam: Current vital signs: Ht 5\' 3"  (1.6 m)    Wt 70.3 kg    BMI 27.46 kg/m   Physical Exam  Constitutional: Appears well-developed and well-nourished.  Psych: Affect appropriate to situation Eyes: No scleral injection HENT: No  OP obstruction. Head: Normocephalic.  Cardiovascular: Normal rate and regular rhythm.  Respiratory: Effort normal, symmetric excursions bilaterally, no audible wheezing. GI: Soft.  No distension. There is no tenderness.  Skin: WDI  Neuro: Mental Status: Patient is awake, alert, oriented to person, place, year, and situation. Patient is able to give a clear and coherent history. Speech hypophonic but fluent, intact comprehension and repetition. No signs of aphasia or neglect. Visual Fields are full. Pupils are equal, round, and reactive to light. EOMI without ptosis or diplopia.  Facial sensation is symmetric to  temperature Facial movement is symmetric.  Hearing is intact to voice. Uvula midline and palate elevates symmetrically. Shoulder shrug is symmetric. Tongue is midline without atrophy or fasciculations.  Tone is normal. Bulk is normal. 5/5 strength was present in all four extremities. Sensation is symmetric to light touch and temperature in the arms and legs. Deep Tendon Reflexes: 2+ and symmetric in the biceps and patellae. Toes are downgoing bilaterally. FNF and HKS are intact bilaterally. Gait - Deferred  I have reviewed labs in epic and the pertinent results are: Cglu 95  I have reviewed the images obtained: NCT head showed did not show acute ischemic changes, hemorrhage or mass. CTA head and neck showed left MCA M3 branch occlusion in the middle Sylvian division. Stable relatively large 8-9 mm unruptured Aneurysm right V4 segment/PICA. MRI brain showed probable small acute infarct in the left insular cortex with associated small petechial hemorrhage.   Assessment: Chelsea Hart is a 88 y.o. female PMHx as noted above with aphasia which resolved.  She is on full dose LMWH and symptoms resolved and not a candidate for thrombolysis. CTA head showed left M3 occlusion and she was not a candidate for intervention.  She recently had bilateral pulmonary emboli and may need TEE and possibly evaluation for hypercoagulable state/paraneoplastic syndrome. She has been on LMWH and testing for hypercoagulable state is not feasible. Recent CTA chest did not show masses.   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 Left M3 occlusion. Recent bilateral pulmonary emboli 04/01/2021 was on LMWH. HTN HLD Right V4 segment/PICA unruptured aneurysm - Neurosurgery follow up scheduled. Smoker.   Plan: - Due to LMWH exposure with small petechial hemorrhage hold antiplatelets until tomorrow. - Recommend TTE she may need TEE. - Recommend CT pelvis. - Recommend labs: HbA1c, lipid panel  - Pending. - Recommend Statin if LDL > 70 - Aspirin 81mg  daily. - SBP goal <140. - Telemetry monitoring for arrhythmia. - Recommend bedside Swallow screen. - Recommend Stroke education. - Recommend PT/OT/SLP consult. - patient encouraged to stop smoking due to aneurysm growth.  This patient is critically ill and at significant risk of neurological worsening, death and care requires constant monitoring of vital signs, hemodynamics,respiratory and cardiac monitoring, neurological assessment, discussion with family, other specialists and medical decision making of high complexity. I spent 73 minutes of neurocritical care time  in the care of  this patient. This was time spent independent of any time provided by nurse practitioner or PA.  Electronically signed by:  Lynnae Sandhoff, MD Page: [phone] 04/16/2021, 9:58 AM  If 7pm- 7am, please page neurology on call as listed in [place].

## 2021-04-16 NOTE — Progress Notes (Signed)
Clinical/Bedside Swallow Evaluation Patient Details  Name: Chelsea Hart MRN: [id number] Date of Birth: 1934/03/02  Today's Date: 04/16/2021 Time: SLP Start Time (ACUTE ONLY): 1450 SLP Stop Time (ACUTE ONLY): 1510 SLP Time Calculation (min) (ACUTE ONLY): 20 min  Past Medical History:  Past Medical History:  Diagnosis Date   Anxiety    Depression    Hypercholesterolemia    Knee pain    Uterine cancer [facility])    Past Surgical History:  Past Surgical History:  Procedure Laterality Date   ABDOMINAL HYSTERECTOMY     BREAST SURGERY     HIP PINNING,CANNULATED Left 03/28/2021   Procedure: CANNULATED HIP PINNING;  Surgeon: Hessie Knows, MD;  Location: [facility] ORS;  Service: Orthopedics;  Laterality: Left;   MIDDLE EAR SURGERY     NOSE SURGERY     HPI:  Patient is an 88 y.o. female with past medical history of left PICA aneurysm, DVT, CKD, pulmonary emboli, hypertension, and hyperlipidemia. Pt admitted to hospital due to CVA symptoms of drool, fatigue, and slurred speech. CT Angio head/neck shows new Left MCA branch occlusion in the middle Sylvian division. MRI brain 04/16/21 shows "Probable small acute infarct in the left insular cortex with  associated mild hemorrhage", bilateral small chronic infarcts in cerebellum, chronic microvascular ischemic change in the white matter, basal ganglia and pons, and chronic microhemorrhage in right frontal lobe. Failed [facility] Swallow Screen; nsg reported drooling and dysarthria.    Assessment / Plan / Recommendation  Clinical Impression   Patient presents with appearance of mild oral dysphagia characterized by generalized oral weakness and at times slow, but intentional oral transit. Patient, daughter report that at baseline, patient chews on the right side of her mouth and at times pockets food there as she avoids the left side of her mouth due to an issue with her gums. Patient was able to self-feed thin liquids by cup and straw, puree, and solid cracker without  assistance. She demonstrated adequate oral containment, prolonged but functional bolus preparation. She did pocket solid cracker briefly in her right lateral sulcus, however she used lingual sweep, liquid wash and puree to facilitate transit of dry solid. Once oral transfer initiated, swallow appears timely, with no overt signs of aspiration. Vocal quality remains clear, vital signs stable. Patient reports that swallowing difficulty she was having earlier at her facility seems to have improved. Recommend initiating dysphagia 3 (mechanical soft) solids with thin liquids, meds whole in puree for easier swallowing. SLP to follow for tolerance and advancement of solids as appropriate.     SLP Visit Diagnosis: Dysphagia, oral phase (R13.11)    Aspiration Risk  Mild aspiration risk    Diet Recommendation Dysphagia 3 (Mech soft);Thin liquid   Liquid Administration via: Cup;Straw Medication Administration: Whole meds with puree Supervision: Intermittent supervision to cue for compensatory strategies Compensations: Slow rate;Lingual sweep for clearance of pocketing;Small sips/bites;Minimize environmental distractions;Follow solids with liquid Postural Changes: Seated upright at 90 degrees    Other  Recommendations Oral Care Recommendations: Oral care BID    Recommendations for follow up therapy are one component of a multi-disciplinary discharge planning process, led by the attending physician.  Recommendations may be updated based on patient status, additional functional criteria and insurance authorization.  Follow up Recommendations Other (comment) (tbd)      Assistance Recommended at Discharge    Functional Status Assessment Patient has had a recent decline in their functional status and demonstrates the ability to make significant improvements in function in a reasonable and predictable  amount of time.  Frequency and Duration min 2x/week  2 weeks       Prognosis Prognosis for Safe Diet  Advancement: Good      Swallow Study   General Date of Onset: 04/16/21 HPI: Patient is an 88 y.o. female with past medical history of left PICA aneurysm, DVT, CKD, pulmonary emboli, hypertension, and hyperlipidemia. Pt admitted to hospital due to CVA symptoms of drool, fatigue, and slurred speech. CT Angio head/neck shows new Left MCA branch occlusion in the middle Sylvian division. MRI brain 04/16/21 shows "Probable small acute infarct in the left insular cortex with  associated mild hemorrhage", bilateral small chronic infarcts in cerebellum, chronic microvascular ischemic change in the white matter, basal ganglia and pons, and chronic microhemorrhage in right frontal lobe. Failed [facility] Swallow Screen; nsg reported drooling and dysarthria. Type of Study: Bedside Swallow Evaluation Previous Swallow Assessment: none on file, daughter/pt report pt stores food on R prior to admit due to avoiding gums on L Diet Prior to this Study: NPO Temperature Spikes Noted: No Respiratory Status: Room air History of Recent Intubation: No Behavior/Cognition: Alert;Cooperative;Pleasant mood Oral Cavity Assessment: Within Functional Limits Oral Care Completed by SLP: No Oral Cavity - Dentition: Other (Comment);Missing dentition (top partial) Vision: Functional for self-feeding Self-Feeding Abilities: Able to feed self Patient Positioning: Upright in bed Baseline Vocal Quality: Low vocal intensity (?mild hypernasal quality) Volitional Swallow: Able to elicit    Oral/Motor/Sensory Function Overall Oral Motor/Sensory Function: Mild impairment Facial ROM: Reduced left Facial Symmetry: Abnormal symmetry left Facial Strength: Within Functional Limits Lingual ROM: Within Functional Limits Lingual Symmetry: Within Functional Limits Lingual Strength: Reduced (generalized weakness) Velum: Other (comment) (elevates symmetrically, however possible slight hypernasality) Mandible: Within Functional Limits   Ice Chips  Ice chips: Impaired Oral Phase Functional Implications: Prolonged oral transit   Thin Liquid Thin Liquid: Impaired Presentation: Cup;Straw;Self Fed Oral Phase Functional Implications: Prolonged oral transit    Nectar Thick Nectar Thick Liquid: Not tested   Honey Thick Honey Thick Liquid: Not tested   Puree Puree: Impaired Presentation: Self Fed;Spoon Oral Phase Functional Implications: Prolonged oral transit   Solid    Deneise Lever, MS, CCC-SLP Speech-Language Pathologist  Solid: Impaired Presentation: Self Fed Oral Phase Functional Implications: Impaired mastication;Prolonged oral transit;Right lateral sulci pocketing      Aliene Altes 04/16/2021,3:46 PM

## 2021-04-16 NOTE — Progress Notes (Signed)
Speech Language Pathology Evaluation Patient Details Name: Chelsea Hart MRN: [id number] DOB: [birth year] Today's Date: 04/16/2021 Time: 8366-2947 SLP Time Calculation (min) (ACUTE ONLY): 14 min  Problem List:  Patient Active Problem List   Diagnosis Date Noted   Acute CVA (cerebrovascular accident) ([place]) 04/16/2021   Depression    Pulmonary embolism ([place])    Brain aneurysm 03/31/2021   AKI (acute kidney injury) ([place]) 03/31/2021   Closed left hip fracture ([place]) 03/27/2021   DNR (do not resuscitate)/DNI(Do Not Intubate) 03/27/2021   Coronary artery disease due to lipid rich plaque 11/14/2020   Smoker 10/03/2018   CKD (chronic kidney disease) stage 3, GFR 30-59 ml/min ([place]) 04/26/2017   Simple chronic bronchitis ([place]) 12/12/2015   Essential hypertension 10/30/2015   Past Medical History:  Past Medical History:  Diagnosis Date   Anxiety    Depression    Hypercholesterolemia    Knee pain    Uterine cancer [facility])    Past Surgical History:  Past Surgical History:  Procedure Laterality Date   ABDOMINAL HYSTERECTOMY     BREAST SURGERY     HIP PINNING,CANNULATED Left 03/28/2021   Procedure: CANNULATED HIP PINNING;  Surgeon: Hessie Knows, MD;  Location: [facility] ORS;  Service: Orthopedics;  Laterality: Left;   MIDDLE EAR SURGERY     NOSE SURGERY     HPI:  Patient is an 88 y.o. female with past medical history of left PICA aneurysm, DVT, CKD, pulmonary emboli, hypertension, and hyperlipidemia. Pt admitted to hospital due to CVA symptoms of drool, fatigue, and slurred speech. CT Angio head/neck shows new Left MCA branch occlusion in the middle Sylvian division. MRI brain 04/16/21 shows "Probable small acute infarct in the left insular cortex with  associated mild hemorrhage", bilateral small chronic infarcts in cerebellum, chronic microvascular ischemic change in the white matter, basal ganglia and pons, and chronic microhemorrhage in right frontal lobe. Failed [facility] Swallow Screen; nsg  reported drooling and dysarthria.   Assessment / Plan / Recommendation Clinical Impression   Patient presents with mild-moderate dysarthria and cognitive-linguistic impairments. Testing was interrupted by pt's transfer to floor, so additional assessment to be completed during next visit. Patient's verbal output is characterized by slow, effortful speech. She has a slight hypernasal quality to her resonance. Pt states she knows what she wants to say but feels it is difficult/slow to produce. Patient is oriented x4, and able to provide details about her medical history. Suspect speech deficits are more motor than language-based, however further language testing to be completed. Selective attention is impacted, with pt being easily distracted when others entered or exited the room. Patient had difficulty with verbal problem solving tasks, but demonstrated awareness of this. Recommend skilled ST during acute stay for diagnostic intervention to identify needs at d/c and to facilitate return to PLOF with communication and cognitive abilities. Please see Swallowing evaluation for additional information.     SLP Assessment  SLP Recommendation/Assessment: Patient needs continued Speech Lanaguage Pathology Services SLP Visit Diagnosis: Dysarthria and anarthria (R47.1);Cognitive communication deficit (R41.841);Aphasia (R47.01)    Recommendations for follow up therapy are one component of a multi-disciplinary discharge planning process, led by the attending physician.  Recommendations may be updated based on patient status, additional functional criteria and insurance authorization.    Follow Up Recommendations  Other (comment) (tbd)    Assistance Recommended at Discharge     Functional Status Assessment Patient has had a recent decline in their functional status and demonstrates the ability to make significant improvements in function  in a reasonable and predictable amount of time.  Frequency and Duration min  2x/week  2 weeks      SLP Evaluation Cognition  Overall Cognitive Status: Impaired/Different from baseline Arousal/Alertness: Awake/alert Orientation Level: Oriented X4 Year: 2023 Month: February Day of Week: Other (Comment) (corrects after brief hesitation) Attention: Selective Selective Attention: Impaired Selective Attention Impairment: Verbal basic;Functional basic (had difficulty concentrating when door to room was opened) Memory:  (immediate recall 4/5 words, delayed recall 3/5 (attention impacts)) Awareness: Appears intact Problem Solving: Impaired Problem Solving Impairment: Verbal basic (slow processing) Executive Function:  (not tested) Safety/Judgment: Impaired       Comprehension  Auditory Comprehension Overall Auditory Comprehension: Other (comment) (needs repetition when distractions present) Yes/No Questions: Within Functional Limits Commands: Within Functional Limits (simple commands) Conversation: Simple Visual Recognition/Discrimination Discrimination: Not tested Reading Comprehension Reading Status: Not tested    Expression Expression Primary Mode of Expression: Verbal Verbal Expression Overall Verbal Expression: Impaired Initiation: No impairment Automatic Speech: Name;Social Response Level of Generative/Spontaneous Verbalization: Conversation Naming: Impairment Divergent:  (attention impacts; will reassess) Verbal Errors: Other (comment) (hesitation, slow to respond) Pragmatics: No impairment Interfering Components: Attention;Speech intelligibility Written Expression Dominant Hand: Right Written Expression: Not tested   Oral / Motor  Oral Motor/Sensory Function Overall Oral Motor/Sensory Function: Mild impairment Facial ROM: Reduced left Facial Symmetry: Abnormal symmetry left Facial Strength: Within Functional Limits Lingual ROM: Within Functional Limits Lingual Symmetry: Within Functional Limits Lingual Strength: Reduced (generalized  weakness) Velum: Other (comment) (elevates symmetrically, however hypernasal quality) Mandible: Within Functional Limits Motor Speech Overall Motor Speech: Impaired Respiration: Within functional limits Phonation: Low vocal intensity Resonance: Hypernasality Articulation: Impaired (slow, effortful, appears to have difficulty coordinating) Level of Impairment: Phrase Intelligibility: Intelligibility reduced Word: 75-100% accurate Phrase: 75-100% accurate Sentence: 75-100% accurate Conversation: 75-100% accurate (95%) Motor Speech Errors: Aware           Deneise Lever, [place], CCC-SLP Speech-Language Pathologist  Aliene Altes 04/16/2021, 5:30 PM

## 2021-04-16 NOTE — Evaluation (Signed)
Physical Therapy Evaluation Patient Details Name: Chelsea Hart MRN: [id number] DOB: 04-10-1933 Today's Date: 04/16/2021  History of Present Illness  Patient is an 88 year old female who reported to [facility] ED today 04/16/21 with reports of difficulty with speech. Admitted for Acute CVA. Patient has a PMH of left PICA aneurysm and pulmonary embolism.   Clinical Impression  Patient seen for PT/OT co-evaluation on this date. Patient tolerated session well and was agreeable to treatment. Upon entering room patient was sitting EOB with OT, and daughter present. Patient arrived from her SNF (this would have been her last day there before discharge home). Per previous chart review and OT, [place]. Daughter states that the home has been arranged for her expected arrival (ramp entrance, BSC, bedroom set up next to kitchen).  Daughter lives down the street and is retired, so is able to assist when needed. Patient was Independent/ Modified Independent with ADLs and Mobility. Patient did not utilize an AD prior to STR.   Patient demonstrated good sitting balance sitting EOB. She was able to perform good weight-shifting to doff pants. Required Min A from OT (due to height of bed), however no LOB noted. BLE strength was not assessed formally, as patient was sitting EOB upon arrival, however patient is able to ambulate, so predicating at least 3/5 strength. Sit to stand transfers from EOB and commode required CGA however progressed to SBA for safety with RW with additional attempts. Ambulating from bed<>commode was completed CGA however quickly progressed to supervision with RW. Sensation in BLE remains intact. Patient demonstrated fairly good functional mobility, however would continue to benefit from skilled physical therapy in order to optimize patient's return to PLOF. Recommend HHPT upon discharge from acute hospitalization.      Recommendations for follow  up therapy are one component of a multi-disciplinary discharge planning process, led by the attending physician.  Recommendations may be updated based on patient status, additional functional criteria and insurance authorization.  Follow Up Recommendations Home health PT    Assistance Recommended at Discharge Frequent or constant Supervision/Assistance  Patient can return home with the following  Direct supervision/assist for medications management;Assist for transportation;A little help with bathing/dressing/bathroom;A little help with walking and/or transfers;Direct supervision/assist for financial management;Help with stairs or ramp for entrance    Equipment Recommendations None recommended by PT  Recommendations for Other Services       Functional Status Assessment Patient has had a recent decline in their functional status and demonstrates the ability to make significant improvements in function in a reasonable and predictable amount of time.     Precautions / Restrictions Precautions Precautions: Fall Restrictions Weight Bearing Restrictions: No      Mobility  Bed Mobility           Sit to supine: Supervision (no assistance required, increased effort)        Transfers Overall transfer level: Needs assistance Equipment used: Rolling walker (2 wheels) Transfers: Sit to/from Stand Sit to Stand: Min guard, Supervision (intially CGA progressed to SBA with increased reps)                Ambulation/Gait Ambulation/Gait assistance: Min guard, Supervision Gait Distance (Feet): 20 Feet Assistive device: Rolling walker (2 wheels) Gait Pattern/deviations: Step-to pattern, Decreased step length - right, Decreased step length - left, Trunk flexed, Narrow base of support, Decreased weight shift to left Gait velocity: decreased     General Gait Details: no  significant LOB noted, good obstacle negotiation/ fall prevention with tangled wires  Stairs             Wheelchair Mobility    Modified Rankin (Stroke Patients Only)       Balance Overall balance assessment: Needs assistance Sitting-balance support: Feet supported, No upper extremity supported Sitting balance-Leahy Scale: Good Sitting balance - Comments: can reach outside BOS at EOB to doff pants, can independently perform pericare on commode   Standing balance support: Bilateral upper extremity supported, Reliant on assistive device for balance Standing balance-Leahy Scale: Fair Standing balance comment: reliance upon UE's for standing                             Pertinent Vitals/Pain Pain Assessment Pain Assessment: No/denies pain Pain Intervention(s): Monitored during session, Repositioned    Home Living Family/patient expects to be discharged to:: Skilled nursing facility (today was going to be her last day at [facility]) Living Arrangements: Alone   Type of Home: House Home Access: Level entry       Home Layout: One level Home Equipment: Conservation officer, nature (2 wheels) Additional Comments: Per patient/sister, has RW but does not use. Per daughter patient is moving to [place] to live with patient's son. Daughter is retired and can check in on patient frequently throughout the day (lives 10 minutes down the street)    Prior Function Prior Level of Function : Independent/Modified Independent;Driving             Mobility Comments: Per previous chart review, patient was independent prior to admission with ambulation without an assistive device. ADLs Comments: Pt reports independence with self care, IADLs, driving, home management and grocery shopping.     Hand Dominance   Dominant Hand: Right    Extremity/Trunk Assessment   Upper Extremity Assessment Upper Extremity Assessment: Generalized weakness;Defer to OT evaluation    Lower Extremity Assessment Lower Extremity Assessment: Generalized weakness (not formally assess as patient was sitting EOB upon  arrival, patient able to ambulate, at least 3/5 strength bilaterally, BLE sensation intact)       Communication   Communication: Other (comment) (speech slow and effortful, increased word finding difficulties)  Cognition Arousal/Alertness: Awake/alert Behavior During Therapy: WFL for tasks assessed/performed Overall Cognitive Status: Impaired/Different from baseline                                          General Comments General comments (skin integrity, edema, etc.): BP: 183/82, pulse 54bpm, SpO2 89-98% on room air    Exercises Other Exercises Other Exercises: patient educated on role of PT, fall risk, discharge recommendations HH vs STR, safety precautions with going home   Assessment/Plan    PT Assessment Patient needs continued PT services  PT Problem List Decreased strength;Decreased range of motion;Decreased activity tolerance;Decreased knowledge of use of DME;Decreased balance;Decreased mobility;Decreased coordination;Decreased cognition;Decreased safety awareness;Decreased knowledge of precautions;Pain       PT Treatment Interventions DME instruction;Gait training;Functional mobility training;Therapeutic activities;Therapeutic exercise;Balance training;Patient/family education;Cognitive remediation    PT Goals (Current goals can be found in the Care Plan section)  Acute Rehab PT Goals Patient Stated Goal: to go home PT Goal Formulation: With patient Time For Goal Achievement: 04/30/21 Potential to Achieve Goals: Good    Frequency 7X/week     Co-evaluation PT/OT/SLP Co-Evaluation/Treatment: Yes Reason for Co-Treatment: Necessary to address cognition/behavior  during functional activity;For patient/therapist safety;To address functional/ADL transfers PT goals addressed during session: Mobility/safety with mobility;Balance;Proper use of DME OT goals addressed during session: ADL's and self-care       AM-PAC PT "6 Clicks" Mobility  Outcome  Measure Help needed turning from your back to your side while in a flat bed without using bedrails?: A Little Help needed moving from lying on your back to sitting on the side of a flat bed without using bedrails?: A Little Help needed moving to and from a bed to a chair (including a wheelchair)?: A Little Help needed standing up from a chair using your arms (e.g., wheelchair or bedside chair)?: A Little Help needed to walk in hospital room?: A Little Help needed climbing 3-5 steps with a railing? : A Little 6 Click Score: 18    End of Session Equipment Utilized During Treatment: Gait belt Activity Tolerance: No increased pain;Patient tolerated treatment well Patient left: in bed;with call bell/phone within reach;with bed alarm set;with family/visitor present Nurse Communication: Mobility status PT Visit Diagnosis: Muscle weakness (generalized) (M62.81);Other abnormalities of gait and mobility (R26.89);Unsteadiness on feet (R26.81);History of falling (Z91.81)    Time: 1749-4496 PT Time Calculation (min) (ACUTE ONLY): 27 min   Charges:   PT Evaluation $PT Eval Low Complexity: 1 Low PT Treatments $Therapeutic Activity: 8-22 mins        Iva Boop, PT  04/16/21. 3:42 PM

## 2021-04-16 NOTE — ED Provider Notes (Signed)
[facility] Provider Note   Event Date/Time   First MD Initiated Contact with Patient 04/16/21 [id number]     (approximate)  History   Code Stroke  HPI Chelsea Hart is a 88 y.o. 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  Last known well time 9 PM on 04/15/2021.  Upon EMS assessment on arrival patient's symptoms had resolved however when patient was reassessed prior to entrance into her emergency department, symptoms had recurred at that time and a code stroke was called.     Physical Exam   Triage Vital Signs: ED Triage Vitals [04/16/21 0953]  Enc Vitals Group     BP      Pulse      Resp      Temp      Temp src      SpO2      Weight 155 lb (70.3 kg)     Height 5\' 3"  (1.6 m)     Head Circumference      [facility] Flow      Pain Score      Pain Loc      Pain Edu?      Excl. in [place]?     Most recent vital signs: Vitals:   04/16/21 1012 04/16/21 1030  BP: (!) 172/65 (!) 161/69  Pulse: (!) 58 (!) 53  Resp: 16 18  Temp: 97.9 F (36.6 C)   SpO2: 99% 100%    General: Awake, oriented x4. CV:  Good peripheral perfusion.  Resp:  Normal effort.  Abd:  No distention.  Other:  Dysarthric elderly Caucasian female laying on stretcher in no distress.  NIH stroke scale 1     ED Results / Procedures / Treatments   Labs (all labs ordered are listed, but only abnormal results are displayed) Labs Reviewed  APTT - Abnormal; Notable for the following components:      Result Value   aPTT 22 (*)    All other components within normal limits  CBC - Abnormal; Notable for the following components:   RBC 3.31 (*)    Hemoglobin 10.3 (*)    HCT 33.5 (*)    MCV 101.2 (*)    Platelets 426 (*)    All other components within normal limits  RESP PANEL BY RT-PCR (FLU A&B, COVID) ARPGX2  ETHANOL  PROTIME-INR   DIFFERENTIAL  URINE DRUG SCREEN, QUALITATIVE ([facility] ONLY)  URINALYSIS, ROUTINE W REFLEX MICROSCOPIC  COMPREHENSIVE METABOLIC PANEL  CBG MONITORING, ED     EKG ED ECG REPORT I, Naaman Plummer, the attending physician, personally viewed and interpreted this ECG.  Date: 04/16/2021 EKG Time: 1014 Rate: 56 Rhythm: normal sinus rhythm QRS Axis: normal Intervals: normal ST/T Wave abnormalities: normal Narrative Interpretation: no evidence of acute ischemia   RADIOLOGY ED MD interpretation: CT of the head without contrast shows no evidence of acute abnormalities with an aspects score of 10  CT angiography of the head and neck shows a distal M3 occlusion on the left that is new from previous imaging -Agree with radiology assessment Official radiology report(s): CT HEAD CODE STROKE WO CONTRAST  Result Date: 04/16/2021 CLINICAL DATA:  Code stroke.  Slurred speech, weakness, drooling EXAM: CT HEAD WITHOUT CONTRAST TECHNIQUE: Contiguous axial images were obtained from the base of the skull through the vertex without intravenous contrast.  RADIATION DOSE REDUCTION: This exam was performed according to the departmental dose-optimization program which includes automated exposure control, adjustment of the mA and/or kV according to patient size and/or use of iterative reconstruction technique. COMPARISON:  CT/CTA head and neck 03/31/2021, MR head 03/30/2021 FINDINGS: Brain: There is no evidence of acute intracranial hemorrhage, extra-axial fluid collection, or acute infarct. Parenchymal volume is within normal limits for age. The ventricles are stable in size. 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 Small remote infarcts are again seen in the bilateral basal ganglia and cerebellar hemispheres. Additional patchy hypodensity in the subcortical and periventricular white matter likely reflects sequela of moderate chronic white matter microangiopathy. There is no mass lesion.  There  is no midline shift. Vascular: There is calcification of the bilateral cavernous ICAs. 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 Skull: Normal. Negative for fracture or focal lesion. Sinuses/Orbits: The imaged paranasal sinuses are clear. Bilateral lens implants are in place. The globes and orbits are otherwise unremarkable. Other: None. ASPECTS [facility] Stroke Program Early CT Score) - Ganglionic level infarction (caudate, lentiform nuclei, internal capsule, insula, M1-M3 cortex): 7 - Supraganglionic infarction (M4-M6 cortex): 3 Total score (0-10 with 10 being normal): 10 IMPRESSION: 1. No acute intracranial pathology. 2. ASPECTS is 10 These results were called by telephone at the time of interpretation on 04/16/2021 at 9:48 am to provider [facility] , who verbally acknowledged these results. Electronically Signed   By: Valetta Mole M.D.   On: 04/16/2021 09:51   CT ANGIO HEAD NECK W WO CM (CODE STROKE)  Result Date: 04/16/2021 CLINICAL DATA:  88 year old female code stroke presentation. Known left PICA aneurysm. EXAM: CT ANGIOGRAPHY HEAD AND NECK TECHNIQUE: Multidetector CT imaging of the head and neck was performed using the standard protocol during bolus administration of intravenous contrast. Multiplanar CT image reconstructions and MIPs were obtained to evaluate the vascular anatomy. Carotid stenosis measurements (when applicable) are obtained utilizing NASCET criteria, using the distal internal carotid diameter as the denominator. RADIATION DOSE REDUCTION: This exam was performed according to the departmental dose-optimization program which includes automated exposure control, adjustment of the mA and/or kV according to patient size and/or use of iterative reconstruction technique. CONTRAST:  69mL OMNIPAQUE IOHEXOL 350 MG/ML SOLN COMPARISON:  Plain head CT 0943 hours today. CTA head and neck 03/31/2021. FINDINGS: CTA NECK Skeleton: Torus palatini is, normal variant, and absent maxillary dentition again  noted. 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 No acute osseous abnormality identified. Upper chest: Negative; Visible upper lobe pulmonary artery branches now appear grossly patent. Other neck: Heterogeneously enlarged right thyroid lobe with hypodense nodules individually about 14 mm, stable. Otherwise negative. Aortic arch: 3 vessel arch configuration. Calcified aortic atherosclerosis. Right carotid system: Brachiocephalic artery plaque without stenosis. Tortuous proximal right CCA. Calcified plaque at the right ICA origin and bulb is stable with less than 50 % stenosis with respect to the distal vessel. Left carotid system: Mild left CCA origin plaque without stenosis. Tortuous proximal left CCA. Stable calcified plaque at the left ICA origin without stenosis. Vertebral arteries: Stable proximal right subclavian artery calcified plaque without stenosis. Normal right vertebral artery origin. Right vertebral artery is patent to the skull base without stenosis. Mild contralateral left subclavian artery plaque without stenosis. Normal left vertebral artery origin. Tortuous left V1 segment. Left vertebral artery is patent to the skull base without stenosis. CTA HEAD Posterior circulation: Fairly codominant distal vertebral arteries are patent to the vertebrobasilar junction without  stenosis. Fairly large, saccular, 8-9 mm aneurysm of the right vertebral artery V4 segment at or near the left PICA origin is stable on series 7, image 122. Normal right PICA origin. Patent basilar artery without stenosis. Normal SCA and PCA origins. Posterior communicating arteries are diminutive or absent. Bilateral PCA branches are within normal limits. Anterior circulation: Both ICA siphons are patent mild to moderate siphon calcified plaque mostly in the cavernous and supraclinoid segments appears stable with only mild stenosis on the left. Normal ophthalmic artery origins. Patent carotid termini. Patent MCA and ACA  origins. Diminutive or absent anterior communicating artery. Bilateral ACA branches are stable and within normal limits. Left MCA M1 segment and bifurcation are patent without stenosis. However, there is a left M3 branch occlusion well demonstrated on series 12, image 29, also on series 9, image 140 and series 7, image 80. This is in the middle sylvian division. Other left MCA branches appear stable. Right MCA M1 segment and trifurcation are patent without stenosis. Right MCA branches are within normal limits. Venous sinuses: Patent. Anatomic variants: None. Review of the MIP images confirms the above findings IMPRESSION: 1. Negative for LV0 but Positive for a new Left MCA M3 branch occlusion in the middle Sylvian division. 2. 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 3. Stable carotid andatherosclerosis without significant stenosis. Aortic Atherosclerosis (ICD10-I70.0). 4. Stable right thyroid Nodules. In the setting of significant comorbidities or limited life expectancy, no follow-up recommended (ref: J Am Coll Radiol. 2015 Feb;12(2): 143-50). Study discussed by telephone with Dr. Valora Piccolo on 04/16/2021 at 10:09 . Electronically Signed   By: Genevie Ann M.D.   On: 04/16/2021 10:11      PROCEDURES:  Critical Care performed: No  .1-3 Lead EKG Interpretation Performed by: Naaman Plummer, MD Authorized by: Naaman Plummer, MD     Interpretation: abnormal     ECG rate:  52   ECG rate assessment: bradycardic     Rhythm: sinus bradycardia     Ectopy: none     Conduction: normal    CRITICAL CARE Performed by: Naaman Plummer   Total critical care time: 33 minutes  Critical care time was exclusive of separately billable procedures and treating other patients.  Critical care was necessary to treat or prevent imminent or life-threatening deterioration.  Critical care was time spent personally by me on the following activities: development of  treatment plan with patient and/or surrogate as well as nursing, discussions with consultants, evaluation of patient's response to treatment, examination of patient, obtaining history from patient or surrogate, ordering and performing treatments and interventions, ordering and review of laboratory studies, ordering and review of radiographic studies, pulse oximetry and re-evaluation of patient's condition.   MEDICATIONS ORDERED IN ED: Medications  fenofibrate tablet 54 mg (0 mg Oral Hold 04/16/21 1109)  ALPRAZolam (XANAX) tablet 0.25 mg (has no administration in time range)  PARoxetine (PAXIL) tablet 40 mg (0 mg Oral Hold 04/16/21 1109)  senna-docusate (Senokot-S) tablet 1 tablet (0 tablets Oral Hold 04/16/21 1110)  melatonin tablet 5 mg (has no administration in time range)  ascorbic acid (VITAMIN C) tablet 500 mg (0 mg Oral Hold 04/16/21 1108)  calcium carbonate (OS-CAL - dosed in mg of elemental calcium) tablet 500 mg of elemental calcium (0 mg of elemental calcium Oral Hold 04/16/21 1108)  cholecalciferol (VITAMIN D3) tablet 2,000 Units (0 Units Oral Hold 04/16/21 1108)  feeding supplement (ENSURE ENLIVE / ENSURE PLUS) liquid 237 mL (  0 mLs Oral Hold 04/16/21 1109)  latanoprost (XALATAN) 0.005 % ophthalmic solution 1 drop (has no administration in time range)  polyvinyl alcohol (LIQUIFILM TEARS) 1.4 % ophthalmic solution 1-2 drop (has no administration in time range)   stroke: mapping our early stages of recovery book (has no administration in time range)  acetaminophen (TYLENOL) tablet 650 mg (has no administration in time range)    Or  acetaminophen (TYLENOL) 160 MG/5ML solution 650 mg (has no administration in time range)    Or  acetaminophen (TYLENOL) suppository 650 mg (has no administration in time range)  iohexol (OMNIPAQUE) 350 MG/ML injection 75 mL (75 mLs Intravenous Contrast Given 04/16/21 0949)     IMPRESSION / MDM / [place] / ED COURSE  I reviewed the triage vital signs  and the nursing notes.                              Differential diagnosis includes, but is not limited to, CVA, TIA, delirium, medication side effect  The patient is on the cardiac monitor to evaluate for evidence of arrhythmia and/or significant heart rate changes.  Insert stroke insert ischemic stroke patient is a 88 year old female who presents with symptoms concerning for TIA/CVA PMH risk factors: Hyperlipidemia, hypertension Neurologic Deficits: Slurred speech Last known Well Time: 2100 04/15/21 NIH Stroke Score: 1 Given History and Exam I have lower suspicion for infectious etiology, neurologic changes secondary to toxicologic ingestion, seizure, complex migraine. Presentation concerning for possible stroke requiring workup.  Workup: Labs: POC glucose, CBC, BMP, LFTs, Troponin, PT/INR, PTT, Type and Screen Other Diagnostics: ECG, CXR, non-contrast head CT followed by CTA brain and neck (to r/o large vessel occlusion amenable to thrombectomy)  CT Noncon of the head does not show any evidence of acute abnormalities CTA of the head and neck does show a left M3 occlusion Given patient's onset of symptoms this morning, intermittent aspect of symptoms, and possible anticoagulation use, she is not a candidate for tPA at this time.  Given location of occlusion, she is not a candidate for thrombectomy Consult: hospitalist, neurologist Disposition: Admit     FINAL CLINICAL IMPRESSION(S) / ED DIAGNOSES   Final diagnoses:  Acute ischemic stroke ([place])  Slurred speech     Rx / DC Orders   ED Discharge Orders     None        Note:  This document was prepared using Dragon voice recognition software and may include unintentional dictation errors.   Naaman Plummer, MD 04/16/21 1150

## 2021-04-16 NOTE — ED Notes (Signed)
Pt attempting to urinate now.

## 2021-04-16 NOTE — ED Triage Notes (Addendum)
Pt to ED via [facility] from [facility]. Pt LKW last night at bedtime. Facility called due to pt waking up with drool, fatigue and slurred speech. Symptoms resolved prior to EMS arrival. EMS stating after encoding pt started having slurred speech again. Code Stroke called upon arrival to ED. CBG 95.  Pt recently seen at [facility] for stroke like symptoms and CT found brain aneurysm and PE. Pt has been on Lovenox since discharge.

## 2021-04-16 NOTE — ED Notes (Signed)
OT with pt at this time.

## 2021-04-16 NOTE — Progress Notes (Signed)
°  Chaplain On-Call responded to Code Stroke notification for ED room 5 at 0941 hours.  Patient was out of the room receiving CT scan procedure. No family was present.  ED Staff will contact Chaplain if additional support is requested.  Chaplain Pollyann Samples M.Div., [facility]

## 2021-04-16 NOTE — Code Documentation (Signed)
Stroke Response Nurse Documentation Code Documentation  Chelsea Hart is a 88 y.o. female arriving to [facility] ED via [facility] on 2/15 with past medical hx of DVT on full dose of lovenox. Code stroke was activated by EDP.   Patient from home where she was LKW last night when she went to bed at 2100 and woke up this am complaining of difficulty speaking.   Stroke team at the bedside on patient arrival. Labs drawn and patient cleared for CT by Dr. Cheri Fowler. Patient to CT with team. NIHSS 0, see documentation for details and code stroke times. CT, CTA head and neck completed. Patient is not a candidate for IV Thrombolytic per Dr Theda Sers, pt LKW>4.5 and she is on full dose lovenox at home.    Bedside handoff with ED RN Lynn Ito.    Velta Addison Stroke Designer, fashion/clothing

## 2021-04-16 NOTE — ED Notes (Signed)
Speech at the bedside.

## 2021-04-16 NOTE — Evaluation (Signed)
Occupational Therapy Evaluation Patient Details Name: Chelsea Hart MRN: [id number] DOB: [birth year] Today's Date: 04/16/2021   History of Present Illness Patient is an 88 year old female who reported to [facility] ED today 04/16/21 with reports of difficulty with speech. Admitted for Acute CVA. Patient has a PMH of left PICA aneurysm and pulmonary embolism.   Clinical Impression   Chelsea Hart was seen for OT evaluation this date. Prior to hospital admission, pt was receiving care in a STR facility. Per pt/daughter pt was to be DC'd from SNF tomorrow, and was planning to go to live with her son. The family reports they have had a ramp built onto the home and purchased necessary AE/DME for pt to safely live at home. Per pt/daughter pt would need to go short periods without assistance at home. Currently pt demonstrates impairments as described below (See OT problem list) which functionally limit her ability to perform ADL/self-care tasks. Pt currently requires SUPERVISION for bed/functional mobility, toilet transfer, toileting, and UB/LB dressing tasks this date.  Pt would benefit from skilled OT services to address noted impairments and functional limitations (see below for any additional details) in order to maximize safety and independence while minimizing falls risk and caregiver burden. Upon hospital discharge, recommend HHOT to maximize pt safety and return to functional independence during meaningful occupations of daily life/.     Recommendations for follow up therapy are one component of a multi-disciplinary discharge planning process, led by the attending physician.  Recommendations may be updated based on patient status, additional functional criteria and insurance authorization.   Follow Up Recommendations  Home health OT    Assistance Recommended at Discharge Intermittent Supervision/Assistance  Patient can return home with the following Assistance with cooking/housework;Assistance with  feeding;Help with stairs or ramp for entrance;A little help with walking and/or transfers;Assist for transportation;A little help with bathing/dressing/bathroom    Functional Status Assessment  Patient has had a recent decline in their functional status and demonstrates the ability to make significant improvements in function in a reasonable and predictable amount of time.  Equipment Recommendations  None recommended by OT    Recommendations for Other Services       Precautions / Restrictions Precautions Precautions: Fall Restrictions Weight Bearing Restrictions: No LLE Weight Bearing: Weight bearing as tolerated      Mobility Bed Mobility Overal bed mobility: Needs Assistance Bed Mobility: Sit to Supine, Supine to Sit     Supine to sit: Supervision, Min guard Sit to supine: Supervision        Transfers Overall transfer level: Needs assistance Equipment used: Rolling walker (2 wheels) Transfers: Sit to/from Stand Sit to Stand: Min guard, Supervision (intially CGA progressed to SBA with increased reps)           General transfer comment: SBA for mgt of lines and leads during toilet transfer.      Balance Overall balance assessment: Needs assistance Sitting-balance support: Feet supported, No upper extremity supported Sitting balance-Leahy Scale: Good Sitting balance - Comments: can reach outside BOS at EOB to doff pants, can independently perform pericare on commode   Standing balance support: Bilateral upper extremity supported, Reliant on assistive device for balance Standing balance-Leahy Scale: Fair Standing balance comment: reliance upon UE's for standing                           ADL either performed or assessed with clinical judgement   ADL Overall ADL's : Needs assistance/impaired Eating/Feeding: Set  up                                   Functional mobility during ADLs: Set up;Supervision/safety;Cueing for safety;Rolling  walker (2 wheels) General ADL Comments: Pt demonstrates improved functional independence with ADL management from past OT evaluations, per chart. She is able to perform bed/functional mobility with supervision assist. Transfers to toielt with close SBA for mgt of lines/leads and is supervision level for toileting.     Vision Patient Visual Report: No change from baseline Vision Assessment?: Yes Eye Alignment: Within Functional Limits Ocular Range of Motion: Within Functional Limits Alignment/Gaze Preference: Within Defined Limits Tracking/Visual Pursuits: Decreased smoothness of eye movement to RIGHT inferior field;Decreased smoothness of eye movement to RIGHT superior field Saccades: Additional eye shifts occurred during testing Visual Fields: No apparent deficits     Perception     Praxis      Pertinent Vitals/Pain Pain Assessment Pain Assessment: No/denies pain Pain Score: 9  Faces Pain Scale: Hurts a little bit Pain Location: L hip Pain Descriptors / Indicators: Grimacing Pain Intervention(s): Limited activity within patient's tolerance, Monitored during session, Repositioned     Hand Dominance Right   Extremity/Trunk Assessment Upper Extremity Assessment Upper Extremity Assessment: Generalized weakness;Overall WFL for tasks assessed   Lower Extremity Assessment Lower Extremity Assessment: Generalized weakness   Cervical / Trunk Assessment Cervical / Trunk Assessment: Normal   Communication Communication Communication: Other (comment) (speech slow and effortful, increased word finding difficulties)   Cognition Arousal/Alertness: Awake/alert Behavior During Therapy: WFL for tasks assessed/performed Overall Cognitive Status: Impaired/Different from baseline                                 General Comments: A&Ox4.     General Comments  183/82, pulse 54bpm, SpO2 89-98% on room air    Exercises Other Exercises Other Exercises: Pt/daughter educated  on role of OT in acute setting (good carry over from past hospitalization), falls prevention strategies, stroke recovery considerations and importance of functional activity during hospital stay.   Shoulder Instructions      Home Living Family/patient expects to be discharged to:: Skilled nursing facility (today was going to be her last day at SNF; planned to move in with her son.) Living Arrangements: Alone   Type of Home: House Home Access: Ramped entrance     [place]: One level               [place]: Conservation officer, nature (2 wheels);Shower seat;Grab bars - tub/shower;BSC/3in1   Additional Comments: Per patient/sister, has RW but does not use. Per daughter patient is moving to [place] to live with patient's son. Daughter is retired and can check in on patient frequently throughout the day (lives 10 minutes down the street)      Prior Functioning/Environment Prior Level of Function : Independent/Modified Independent;Driving             Mobility Comments: Per previous chart review, patient was independent prior to admission with ambulation without an assistive device. ADLs Comments: Pt reports independence with self care, IADLs, driving, home management and grocery shopping prior to initial admission. She reports being up frequently at her STR, walking longer distances using a RW, and completing self-care tasks without facility assist.        OT Problem List: Decreased strength;Decreased activity tolerance;Decreased cognition;Decreased knowledge of use of  DME or AE;Decreased range of motion;Impaired balance (sitting and/or standing);Decreased safety awareness;Cardiopulmonary status limiting activity      OT Treatment/Interventions: Self-care/ADL training;Balance training;Cognitive remediation/compensation;Therapeutic exercise;DME and/or AE instruction;Therapeutic activities;Patient/family education    OT Goals(Current goals can be found in the care plan section)  Acute Rehab OT Goals Patient Stated Goal: To go home to live with my son OT Goal Formulation: With patient Time For Goal Achievement: 04/30/21 Potential to Achieve Goals: Good ADL Goals Pt Will Perform Grooming: standing;with set-up;with supervision Pt Will Transfer to Toilet: with modified independence;bedside commode;ambulating Pt Will Perform Toileting - Clothing Manipulation and hygiene: with adaptive equipment;sit to/from stand;with modified independence  OT Frequency: Min 5X/week (Acute Stroke)    Co-evaluation   Reason for Co-Treatment: For patient/therapist safety;To address functional/ADL transfers PT goals addressed during session: Mobility/safety with mobility;Balance;Proper use of DME OT goals addressed during session: ADL's and self-care;Proper use of Adaptive equipment and DME      AM-PAC OT "6 Clicks" Daily Activity     Outcome Measure Help from another person eating meals?: A Little Help from another person taking care of personal grooming?: A Little Help from another person toileting, which includes using toliet, bedpan, or urinal?: A Lot Help from another person bathing (including washing, rinsing, drying)?: A Lot Help from another person to put on and taking off regular upper body clothing?: A Little Help from another person to put on and taking off regular lower body clothing?: A Lot 6 Click Score: 15   End of Session Equipment Utilized During Treatment: Gait belt;Rolling walker (2 wheels)  Activity Tolerance: Patient tolerated treatment well Patient left: in bed;with call bell/phone within reach;with family/visitor present (In gurney bed in ER.)  OT Visit Diagnosis: Muscle weakness (generalized) (M62.81);Unsteadiness on feet (R26.81)                Time: 2703-5009 OT Time Calculation (min): 49 min Charges:  OT General Charges $OT Visit: 1 Visit OT Evaluation $OT Eval Moderate Complexity: 1 Mod OT Treatments $Self Care/Home Management : 23-37  mins  Shara Blazing, M.S., OTR/L [facility] Ascom: [phone] 04/16/21, 4:28 PM

## 2021-04-16 NOTE — ED Notes (Signed)
Activated code stroke w/[facility] @ 9:35

## 2021-04-17 ENCOUNTER — Inpatient Hospital Stay
Admit: 2021-04-17 | Discharge: 2021-04-17 | Disposition: A | Discharge status: Home / Self Care | Payer: Medicare Other | Attending: Internal Medicine | Admitting: Internal Medicine

## 2021-04-17 ENCOUNTER — Inpatient Hospital Stay: Payer: Medicare Other

## 2021-04-17 DIAGNOSIS — I2699 Other pulmonary embolism without acute cor pulmonale: Secondary | ICD-10-CM | POA: Diagnosis not present

## 2021-04-17 DIAGNOSIS — I10 Essential (primary) hypertension: Secondary | ICD-10-CM | POA: Diagnosis not present

## 2021-04-17 DIAGNOSIS — I671 Cerebral aneurysm, nonruptured: Secondary | ICD-10-CM

## 2021-04-17 DIAGNOSIS — R4781 Slurred speech: Secondary | ICD-10-CM | POA: Diagnosis not present

## 2021-04-17 DIAGNOSIS — I639 Cerebral infarction, unspecified: Secondary | ICD-10-CM

## 2021-04-17 DIAGNOSIS — I63412 Cerebral infarction due to embolism of left middle cerebral artery: Secondary | ICD-10-CM | POA: Diagnosis not present

## 2021-04-17 LAB — LIPID PANEL
Cholesterol: 118 mg/dL (ref 0–200)
HDL: 43 mg/dL (ref >40)
LDL Cholesterol: 52 mg/dL (ref 0–99)
Total CHOL/HDL Ratio: 2.7 RATIO
Triglycerides: 114 mg/dL (ref <150)
VLDL: 23 mg/dL (ref 0–40)

## 2021-04-17 LAB — URINE DRUG SCREEN, QUALITATIVE (ARMC ONLY)
Amphetamines, Ur Screen: NOT DETECTED
Barbiturates, Ur Screen: NOT DETECTED
Benzodiazepine, Ur Scrn: NOT DETECTED
Cannabinoid 50 Ng, Ur ~~LOC~~: NOT DETECTED
Cocaine Metabolite,Ur ~~LOC~~: NOT DETECTED
MDMA (Ecstasy)Ur Screen: NOT DETECTED
Methadone Scn, Ur: NOT DETECTED
Opiate, Ur Screen: NOT DETECTED
Phencyclidine (PCP) Ur S: NOT DETECTED
Tricyclic, Ur Screen: NOT DETECTED

## 2021-04-17 LAB — URINALYSIS, ROUTINE W REFLEX MICROSCOPIC
Bilirubin Urine: NEGATIVE
Glucose, UA: NEGATIVE mg/dL
Hgb urine dipstick: NEGATIVE
Ketones, ur: NEGATIVE mg/dL
Leukocytes,Ua: NEGATIVE
Nitrite: NEGATIVE
Protein, ur: NEGATIVE mg/dL
Specific Gravity, Urine: 1.019 (ref 1.005–1.030)
pH: 7 (ref 5.0–8.0)

## 2021-04-17 LAB — ECHOCARDIOGRAM COMPLETE
AR max vel: 3.58 cm2
AV Area VTI: 4.24 cm2
AV Area mean vel: 3.8 cm2
AV Mean grad: 6 mmHg
AV Peak grad: 9.2 mmHg
Ao pk vel: 1.52 m/s
Area-P 1/2: 2.47 cm2
Height: 63 in
MV VTI: 3.49 cm2
S' Lateral: 3.4 cm
Weight: 2480.02 oz

## 2021-04-17 LAB — HEMOGLOBIN A1C
Hgb A1c MFr Bld: 5.4 % (ref 4.8–5.6)
Mean Plasma Glucose: 108.28 mg/dL

## 2021-04-17 MED ORDER — ASPIRIN EC 81 MG PO TBEC
81.0000 mg | DELAYED_RELEASE_TABLET | Freq: Every day | ORAL | Status: DC
  Administered 2021-04-17 – 2021-04-18 (×2): 81 mg via ORAL
  Filled 2021-04-17 (×2): qty 1

## 2021-04-17 MED ORDER — IOHEXOL 350 MG/ML SOLN
80.0000 mL | Freq: Once | INTRAVENOUS | Status: AC | PRN
  Administered 2021-04-17: 11:00:00 80 mL via INTRAVENOUS

## 2021-04-17 NOTE — Progress Notes (Signed)
Occupational Therapy Treatment Patient Details Name: Chelsea Hart MRN: [id number] DOB: 1933/12/15 Today's Date: 04/17/2021   History of present illness Patient is an 88 year old female who reported to [facility] ED today 04/16/21 with reports of difficulty with speech. Admitted for Acute CVA. Patient has a PMH of left PICA aneurysm and pulmonary embolism.   OT comments  Chelsea Hart was seen for OT treatment on this date. Upon arrival to room pt seated on BSC, agreeable to tx. Pt requires MIN cues but no physical assist to complete seated perihygiene. SUPERVISION grooming standing sinkside with no UE support. Pt denies need to use RW in room then repeatedly noted reaching for furniture, 1 minor LOB navigating around furniture pt self corrects. Goals met and updated this session. Will continue to follow POC. Discharge recommendation remains appropriate.     Recommendations for follow up therapy are one component of a multi-disciplinary discharge planning process, led by the attending physician.  Recommendations may be updated based on patient status, additional functional criteria and insurance authorization.    Follow Up Recommendations  Home health OT    Assistance Recommended at Discharge Intermittent Supervision/Assistance  Patient can return home with the following  Assistance with cooking/housework;Help with stairs or ramp for entrance;Assist for transportation   Equipment Recommendations  None recommended by OT    Recommendations for Other Services      Precautions / Restrictions Precautions Precautions: Fall Restrictions Weight Bearing Restrictions: No       Mobility Bed Mobility               General bed mobility comments: recieved/left in sitting    Transfers Overall transfer level: Modified independent Equipment used: None                     Balance Overall balance assessment: Needs assistance Sitting-balance support: Feet supported, No upper extremity  supported Sitting balance-Leahy Scale: Normal     Standing balance support: No upper extremity supported, During functional activity Standing balance-Leahy Scale: Good                             ADL either performed or assessed with clinical judgement   ADL Overall ADL's : Needs assistance/impaired                                       General ADL Comments: MIN cues but no physical assist to complete seated perihygiene. SUPERVISION grooming standing sinkside with no UE support. Pt denies need to use RW in room then repeatedly noted reaching for furniture.      Cognition Arousal/Alertness: Awake/alert Behavior During Therapy: WFL for tasks assessed/performed Overall Cognitive Status: Impaired/Different from baseline Area of Impairment: Safety/judgement                         Safety/Judgement: Decreased awareness of safety, Decreased awareness of deficits              Pertinent Vitals/ Pain       Pain Assessment Pain Assessment: No/denies pain   Frequency  Min 5X/week        Progress Toward Goals  OT Goals(current goals can now be found in the care plan section)  Progress towards OT goals: Goals met and updated - see care plan  Acute Rehab OT Goals Patient Stated  Goal: to go home OT Goal Formulation: With patient Time For Goal Achievement: 04/30/21 Potential to Achieve Goals: Good ADL Goals Pt Will Perform Lower Body Dressing: Independently;sit to/from stand Pt Will Perform Tub/Shower Transfer: Shower transfer;Independently Additional ADL Goal #1: Pt will verbalize plan to implement x3 falls prevention strategies.  Plan Discharge plan remains appropriate;Frequency remains appropriate       AM-PAC OT "6 Clicks" Daily Activity     Outcome Measure   Help from another person eating meals?: None Help from another person taking care of personal grooming?: A Little Help from another person toileting, which includes using  toliet, bedpan, or urinal?: A Little Help from another person bathing (including washing, rinsing, drying)?: A Little Help from another person to put on and taking off regular upper body clothing?: None Help from another person to put on and taking off regular lower body clothing?: A Little 6 Click Score: 20    End of Session    OT Visit Diagnosis: Muscle weakness (generalized) (M62.81);Unsteadiness on feet (R26.81)   Activity Tolerance Patient tolerated treatment well   Patient Left in chair;with call bell/phone within reach   Nurse Communication          Time: 6767-2094 OT Time Calculation (min): 14 min  Charges: OT General Charges $OT Visit: 1 Visit OT Treatments $Self Care/Home Management : 8-22 mins  Dessie Coma, M.S. OTR/L  04/17/21, 9:56 AM  ascom [phone]

## 2021-04-17 NOTE — Assessment & Plan Note (Signed)
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

## 2021-04-17 NOTE — Assessment & Plan Note (Addendum)
CKD stage IIIa.  Last creatinine 0.95 with a GFR 59

## 2021-04-17 NOTE — Progress Notes (Addendum)
Speech Language Pathology Treatment:    Patient Details Name: Chelsea Hart MRN: [id number] DOB: [birth year] Today's Date: 04/17/2021 Time: 7322-0254 SLP Time Calculation (min) (ACUTE ONLY): 35 min  Assessment / Plan / Recommendation Clinical Impression  Upon SLP entrance to room, pt seated EOB with PT present. Pt about to initiate consuming lunch. Hoping to d/c today.  Pt seen for diet tolerance. Pt observed with items from mech soft lunch tray including chopped pot roast with gravy, soft cooked carrots, mashed potatoes, and iced tea (via straw). Pt did not require any assistance for meal set up or with feeding. Pt with inconsistently prolonged mastication and occasional pocketing of solids in R lateral sulcus; however, pt independently utilized lingual sweep and liquid wash to clear. Per chart review, this appears to be pt's baseline (see clinical swallowing evaluation note from 2/15 for details). No overt or subtle s/sx noted across trials.   Per chart review, temp and WBC WNL. CT Angio Chest 04/17/21 "There is interval decrease in number of filling defects in  subsegmental pulmonary artery branches in the right lower lobe.  There are residual small filling defects in segmental/subsegmental  branches in the right lung. There are no new foci of pulmonary  embolism. There is no evidence of right ventricular strain. There are no new focal pulmonary infiltrates. There is no pleural  effusion. Coronary artery calcifications are seen. Other findings as described in the body of the report."  Recommend continuation of mech soft solids with thin liquids with safe swallowing strategies/aspiration precautions as outlined below.  Pt seen for speech/language tx targeting further assessment of functional cognitive-linguistic ability and dysarthria. Pt participated in further assessment via informal means and portions of Cognistat. Pt's speech is fluent, appropriate, and without s/sx dysarthria. Pt with x1  instance of anomia during informal conversational exchanges; however, pt independently repaired breakdown with circumlocution. Pt demonstrated intact repetition and confrontation naming. No difficulty noted with auditory comprehension (e.g. yes/no questions, commands). Pt with intact verbal problem solving and reasoning; however, decreased appreciation for CLOF suspected. Pt with mild difficulty with short term recall (Memory subtest of Cognistat) and working memory Environmental manager).   Pt with marked improvement in speech, language, and cognition compared to previous session; however, some mildly deficits persists. Pt endorses living alone and being independent with ADLs and IADLs (including driving, medication management, and financial management) prior to recent hip fx in January 2023. At the time of tx, pt with plan to d/c to son's home where she states someone will be present "all the time".  Recommend [place] SLP services targeting higher level cognitive-linguistic ability in home/functional setting given pt's PLOF. Anticipate need for initial frequent/constant supervision at d/c as well as assistance with IADLs (including medication/financial management). Do not recommend driving until cleared by MD.  SLP to sign off at this level of care as pt has no acute SLP needs.   Pt and RN made aware of diet recommendations, safe swallowing strategies/aspiration precautions, progress to date, d/c recommendations, and SLP POC. Pt verbalized understanding/agreement.    HPI HPI: Per 89 H&P "Chelsea Hart is a 88 y.o. female with medical history significant bilateral pulmonary emboli not on anticoagulation due to increased risk of bleeding, anxiety, depression, history of a PICA aneurysm who was brought into the ER as a code stroke.  Patient's last known well was around 9 PM the night prior to admission and on the day of admission she was noted to have slurred speech by the nursing home  staff who sent her to the ER for further evaluation.  Patient was seen by neurology in the ER and was not a candidate for tPA because her last known well time was greater than 4 and half hours.  I am unable to do a review of systems on this patient due to her dysarthria." MRI brain on admission "Probable small acute infarct in the left insular cortex with  associated mild hemorrhage.     6 x 9 mm aneurysm left distal vertebral artery unchanged from prior  studies     Atrophy and moderate chronic microvascular ischemia." CT Angio Chest 04/17/21 "There is interval decrease in number of filling defects in  subsegmental pulmonary artery branches in the right lower lobe.  There are residual small filling defects in segmental/subsegmental  branches in the right lung. There are no new foci of pulmonary  embolism. There is no evidence of right ventricular strain.     There are no new focal pulmonary infiltrates. There is no pleural  effusion. Coronary artery calcifications are seen.     Other findings as described in the body of the report."      SLP Plan  All goals met      Recommendations for follow up therapy are one component of a multi-disciplinary discharge planning process, led by the attending physician.  Recommendations may be updated based on patient status, additional functional criteria and insurance authorization.    Recommendations  Diet recommendations: Dysphagia 3 (mechanical soft) Medication Administration: Whole meds with puree Supervision: Patient able to self feed Compensations: Slow rate;Lingual sweep for clearance of pocketing;Small sips/bites;Minimize environmental distractions;Follow solids with liquid                Oral Care Recommendations: Oral care BID Follow Up Recommendations: Home health SLP Assistance recommended at discharge: Frequent or constant Supervision/Assistance SLP Visit Diagnosis: Cognitive communication deficit (K24.469) Plan: All goals met           Cherrie Gauze, M.S., [facility] [phone] ([place])  Quintella Baton  04/17/2021, 1:43 PM

## 2021-04-17 NOTE — Progress Notes (Signed)
Physical Therapy Treatment Patient Details Name: Chelsea Hart MRN: [id number] DOB: 19-Mar-1933 Today's Date: 04/17/2021   History of Present Illness Patient is an 88 year old female who reported to [facility] ED today 04/16/21 with reports of difficulty with speech. Admitted for Acute CVA. Patient has a PMH of left PICA aneurysm and pulmonary embolism.    PT Comments    Patient agreeable to PT. She is hopeful to be discharged home. Gait training performed with and without rolling walker. Recommend to continue using rolling walker with ambulation for safety and fall prevention. Patient is making progress with functional independence and activity tolerance. Sp02 100% on room air after walking with no significant shortness of breath noted with activity. Recommend to continue PT to maximize independence and decrease caregiver burden in preparation for discharge home.    Recommendations for follow up therapy are one component of a multi-disciplinary discharge planning process, led by the attending physician.  Recommendations may be updated based on patient status, additional functional criteria and insurance authorization.  Follow Up Recommendations  Home health PT     Assistance Recommended at Discharge Intermittent Supervision/Assistance  Patient can return home with the following A little help with walking and/or transfers;A little help with bathing/dressing/bathroom;Assist for transportation;Help with stairs or ramp for entrance   Equipment Recommendations  None recommended by PT    Recommendations for Other Services       Precautions / Restrictions Precautions Precautions: Fall Restrictions Weight Bearing Restrictions: No     Mobility  Bed Mobility               General bed mobility comments: not observed as patient out of bed on arrival to room and post session    Transfers Overall transfer level: Modified independent   Transfers: Sit to/from Stand              General transfer comment: good safety awareness demonstrated    Ambulation/Gait Ambulation/Gait assistance: Supervision, Min guard Gait Distance (Feet): 60 Feet Assistive device: None, Rolling walker (2 wheels) (with and without rolling walker) Gait Pattern/deviations: Narrow base of support, Trunk flexed Gait velocity: decreased     General Gait Details: patient ambulated 20 ft without rolling walker and had tendency to reach out for furniture for single UE support. with use of rolling walker, patient with improved gait pattern and steadiness. supervision provided with rolling walker and educated patient to continue using rolling walker for support with ambulation. Sp02 100% on room air after walking with no significant shortness of breath noted   Stairs             Wheelchair Mobility    Modified Rankin (Stroke Patients Only)       Balance                                            Cognition Arousal/Alertness: Awake/alert Behavior During Therapy: WFL for tasks assessed/performed                                   General Comments: patient is able to follow all commands without difficulty        Exercises      General Comments        Pertinent Vitals/Pain Pain Assessment Pain Assessment: No/denies pain    Home Living  Prior Function            PT Goals (current goals can now be found in the care plan section) Acute Rehab PT Goals Patient Stated Goal: to go home PT Goal Formulation: With patient Time For Goal Achievement: 04/30/21 Potential to Achieve Goals: Good Progress towards PT goals: Progressing toward goals    Frequency    7X/week      PT Plan Current plan remains appropriate    Co-evaluation              AM-PAC PT "6 Clicks" Mobility   Outcome Measure  Help needed turning from your back to your side while in a flat bed without using bedrails?: A  Little Help needed moving from lying on your back to sitting on the side of a flat bed without using bedrails?: A Little Help needed moving to and from a bed to a chair (including a wheelchair)?: A Little Help needed standing up from a chair using your arms (e.g., wheelchair or bedside chair)?: A Little Help needed to walk in hospital room?: A Little Help needed climbing 3-5 steps with a railing? : A Little 6 Click Score: 18    End of Session   Activity Tolerance: Patient tolerated treatment well Patient left:  (sitting up on edge of bed with SLP in the room, set-up with lunch)   PT Visit Diagnosis: Muscle weakness (generalized) (M62.81);Other abnormalities of gait and mobility (R26.89);Unsteadiness on feet (R26.81);History of falling (Z91.81)     Time: 4628-6381 PT Time Calculation (min) (ACUTE ONLY): 13 min  Charges:  $Gait Training: 8-22 mins                     Minna Merritts, PT, MPT   [name] 04/17/2021, 1:02 PM

## 2021-04-17 NOTE — Assessment & Plan Note (Addendum)
Small acute infarct in the left insular cortex with associated mild hemorrhage.  TEE negative for source of emboli.  CT angio of the head and neck did not show any source of emboli.  Patient on aspirin and Lipitor.  LDL 52.  Upon discharge neurology recommended 21 days of Plavix.  Follow-up with cardiology for event monitor.

## 2021-04-17 NOTE — TOC Initial Note (Signed)
Transition of Care [facility]) - Initial/Assessment Note    Patient Details  Name: Chelsea Hart MRN: [id number] Date of Birth: 11-10-1933  Transition of [facility] CM/SW Contact:    Pete Pelt, RN Phone Number: 04/17/2021, 5:12 PM  Clinical Narrative:   Patient is currently moving from [facility] to son's house in [place].  She states she plans to stop driving but her son and daughter can assist with her care and transportation.  She states right now, she is able to assemble her pills in a pillbox to assist with compliance, but her family can assist her if she has concerns.   Patient is set up with [facility] from [facility] with [facility] that they can accept patient.  There are no DME recommendtions at this time.  Due to patient relocating, she does not have a local PCP.  PCP resources given.  Patient also discussed with RNCM that she will speak with family to see if she can see their providers as well.  She understands that she needs to be seen sooner rather than later.                  Expected Discharge Plan: [place] Barriers to Discharge: Continued Medical Work up   Patient Goals and CMS Choice Patient states their goals for this hospitalization and ongoing recovery are:: To get settled into my son's house and know my limits   Choice offered to / list presented to : NA  Expected Discharge Plan and Services Expected Discharge Plan: [place]   Discharge Planning Services: CM Consult Post Acute Care Choice: [place] arrangements for the past 2 months: Single Family Home                           HH Arranged: PT, OT [place] Agency: [place] ([facility]) Date HH Agency Contacted: 04/17/21 Time HH Agency Contacted: 1300 Representative spoke with at [place]: Corene Cornea  Prior Living Arrangements/Services Living arrangements for the past 2 months: [place] with:: Self, Adult  Children Patient language and need for interpreter reviewed:: Yes (No interpreter required) Do you feel safe going back to the place where you live?: Yes      Need for Family Participation in Patient Care: Yes (Comment) Care giver support system in place?: Yes (comment)   Criminal Activity/Legal Involvement Pertinent to Current Situation/Hospitalization: No - Comment as needed  Activities of Daily Living Home Assistive Devices/Equipment: Walker (specify type) ADL Screening (condition at time of admission) Patient's cognitive ability adequate to safely complete daily activities?: Yes Is the patient deaf or have difficulty hearing?: No Does the patient have difficulty seeing, even when wearing glasses/contacts?: No Does the patient have difficulty concentrating, remembering, or making decisions?: No Patient able to express need for assistance with ADLs?: Yes Does the patient have difficulty dressing or bathing?: No Independently performs ADLs?: No Communication: Needs assistance Is this a change from baseline?: Pre-admission baseline Dressing (OT): Needs assistance Is this a change from baseline?: Pre-admission baseline Grooming: Needs assistance Is this a change from baseline?: Pre-admission baseline Feeding: Needs assistance Is this a change from baseline?: Pre-admission baseline Bathing: Needs assistance Is this a change from baseline?: Pre-admission baseline Toileting: Needs assistance Is this a change from baseline?: Pre-admission baseline In/Out Bed: Needs assistance Is this a change from baseline?: Pre-admission baseline Walks in Home: Independent Does the patient have difficulty  walking or climbing stairs?: Yes Weakness of Legs: Both Weakness of Arms/Hands: None  Permission Sought/Granted Permission sought to share information with : Case Manager Permission granted to share information with : Yes, Verbal Permission Granted     Permission granted to share info w AGENCY:  [facility]        Emotional Assessment Appearance:: Appears stated age Attitude/Demeanor/Rapport: Gracious, Engaged Affect (typically observed): Pleasant, Appropriate Orientation: : Oriented to Self, Oriented to Place, Oriented to  Time, Oriented to Situation Alcohol / Substance Use: Not Applicable Psych Involvement: No (comment)  Admission diagnosis:  Slurred speech [R47.81] Acute ischemic stroke [facility]) [I63.9] Acute CVA (cerebrovascular accident) ([place]) [I63.9] CVA (cerebral vascular accident) [facility]) [I63.9] Patient Active Problem List   Diagnosis Date Noted   CVA (cerebral vascular accident) ([place]) 04/17/2021   Acute CVA (cerebrovascular accident) ([place]) 04/16/2021   Depression    Pulmonary embolism ([place])    Brain aneurysm 03/31/2021   Lesion of left native kidney 03/31/2021   Closed left hip fracture ([place]) 03/27/2021   DNR (do not resuscitate)/DNI(Do Not Intubate) 03/27/2021   Coronary artery disease due to lipid rich plaque 11/14/2020   Smoker 10/03/2018   CKD (chronic kidney disease) stage 3, GFR 30-59 ml/min ([place]) 04/26/2017   Simple chronic bronchitis ([place]) 12/12/2015   Essential hypertension 10/30/2015   PCP:  Pcp, No Pharmacy:   [facility] [employer], [address] [place] [address] Phone: [phone] Fax: [phone]     Social Determinants of Health (SDOH) Interventions    Readmission Risk Interventions Readmission Risk Prevention Plan 04/17/2021  Post Dischage Appt Complete  Medication Screening Complete  Transportation Screening Complete  Some recent data might be hidden

## 2021-04-17 NOTE — Progress Notes (Signed)
°  Progress Note   Patient: Chelsea Hart PRF:[id number] DOB: 11/23/1933 DOA: 04/16/2021     1 DOS: the patient was seen and examined on 04/17/2021    Assessment and Plan: * Acute CVA (cerebrovascular accident) [facility])- (present on admission) Small acute infarct in the left insular cortex with associated mild hemorrhage.  Neurology wanted to get a TEE.  I will message cardiology to set up exam.  Patient on aspirin and Lipitor.  LDL 52.  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  Pulmonary embolism ([place])- (present on admission) CT scan again showing pulmonary emboli but no new emboli from last exam.  Decision last time was not to anticoagulate because these were asymptomatic.    Lesion of left native kidney 1.9 cm inferior left renal cortical lesion.  (New from 2017 CT scan) will recommend an MRI as outpatient for further characterization.  CKD (chronic kidney disease) stage 3, GFR 30-59 ml/min (HCC)- (present on admission) CKD stage IIIa.  Last creatinine 0.95 with a GFR 59  Depression Continue Paxil  Closed left hip fracture ([place]) Completed rehab for recent left hip fracture.  Physical therapy recommending home with home health.        Subjective: Patient regained her speech and able to talk.  Feels her strength is good.  Admitted with stroke.  Physical Exam: Vitals:   04/17/21 0119 04/17/21 0448 04/17/21 0837 04/17/21 1200  BP: (!) 135/57 132/61 (!) 127/59 (!) 117/58  Pulse: (!) 56 (!) 51 (!) 53 74  Resp: 15 18 18 16   Temp: 97.7 F (36.5 C) 98.9 F (37.2 C) 98.9 F (37.2 C) 98.5 F (36.9 C)  TempSrc: Oral Oral Oral   SpO2: 98% 100% 99% 98%  Weight:      Height:       Physical Exam HENT:     Head: Normocephalic.     Mouth/Throat:     Pharynx: No oropharyngeal exudate.  Eyes:     General: Lids are normal.     Conjunctiva/sclera: Conjunctivae normal.  Cardiovascular:     Rate and Rhythm:  Normal rate and regular rhythm.     Heart sounds: Normal heart sounds, S1 normal and S2 normal.  Pulmonary:     Breath sounds: No decreased breath sounds, wheezing, rhonchi or rales.  Abdominal:     Palpations: Abdomen is soft.     Tenderness: There is no abdominal tenderness.  Musculoskeletal:     Right lower leg: No swelling.     Left lower leg: No swelling.  Skin:    General: Skin is warm.     Findings: No rash.  Neurological:     Mental Status: She is alert and oriented to person, place, and time.     Data Reviewed: LDL 52, hemoglobin 10.3, creatinine 0.95 with a GFR 59  Family Communication: Spoke with patient's daughter on the phone  Disposition: Status is: Observation Neurology recommended a TEE and we will set this up for tomorrow.  Planned Discharge Destination: Home with Home Health   Author: Loletha Grayer, MD 04/17/2021 2:36 PM  For on call review [email or web address].

## 2021-04-17 NOTE — Assessment & Plan Note (Signed)
Continue Paxil.

## 2021-04-17 NOTE — Consult Note (Signed)
[facility] Consultation Note  Patient ID: Chelsea Hart, MRN: [id number], DOB/AGE: August 10, 1933 88 y.o. Admit date: 04/16/2021   Date of Consult: 04/17/2021 Primary Physician: Pcp, No Primary Cardiologist: None  Chief Complaint:  Chief Complaint  Patient presents with   Code Stroke   Reason for Consult:  Stroke  HPI: 88 y.o. female with hypertension hyperlipidemia who has had new onset of significant stroke.  Now evaluated showing left middle cerebral infarct from possible different causes including atherosclerosis, and/or embolism.  The patient does have minimal carotid atherosclerosis.  There is no evidence of rhythm disturbances or atrial fibrillation at this point and the concerns are that the patient may have cardiovascular cause.  We have discussed at length further work-up including monitor as well as transesophageal echocardiogram.  The patient understands the risk and benefits of transesophageal echocardiogram and agrees  Past Medical History:  Diagnosis Date   Anxiety    Depression    Hypercholesterolemia    Knee pain    Uterine cancer [facility])       Surgical History:  Past Surgical History:  Procedure Laterality Date   ABDOMINAL HYSTERECTOMY     BREAST SURGERY     HIP PINNING,CANNULATED Left 03/28/2021   Procedure: CANNULATED HIP PINNING;  Surgeon: Hessie Knows, MD;  Location: [facility] ORS;  Service: Orthopedics;  Laterality: Left;   MIDDLE EAR SURGERY     NOSE SURGERY       Home Meds: Prior to Admission medications   Medication Sig Start Date End Date Taking? Authorizing Provider  ALPRAZolam Duanne Moron) 0.5 MG tablet Take 0.5 tablets (0.25 mg total) by mouth at bedtime as needed for sleep. 04/01/21  Yes Sharen Hones, MD  ascorbic acid (VITAMIN C) 500 MG tablet Take 500 mg by mouth daily.   Yes [provider]  aspirin 81 MG EC tablet Take 81 mg by mouth daily.   Yes [provider]  calcium carbonate (OSCAL) 1500 (600 Ca) MG TABS tablet Take 1  tablet by mouth daily.   Yes [provider]  Cholecalciferol 50 MCG (2000 UT) TABS Take 2,000 Units by mouth daily.   Yes [provider]  fenofibrate 54 MG tablet Take 54 mg by mouth daily.   Yes [provider]  latanoprost (XALATAN) 0.005 % ophthalmic solution Place 1 drop into both eyes at bedtime.   Yes [provider]  melatonin 3 MG TABS tablet Take 3 mg by mouth at bedtime.   Yes [provider]  metoprolol tartrate (LOPRESSOR) 25 MG tablet Take 25 mg by mouth 2 (two) times daily.   Yes [provider]  PARoxetine (PAXIL) 40 MG tablet Take 40 mg by mouth daily.   Yes [provider]  Polyethyl Glyc-Propyl Glyc PF 0.4-0.3 % SOLN Apply 1-2 drops to eye 2 (two) times daily as needed.   Yes [provider]  polyethylene glycol (MIRALAX / GLYCOLAX) 17 g packet Take 17 g by mouth daily.   Yes [provider]  sennosides-docusate sodium (SENOKOT-S) 8.6-50 MG tablet Take 1 tablet by mouth daily.   Yes [provider]  simvastatin (ZOCOR) 20 MG tablet Take 20 mg by mouth at bedtime.   Yes [provider]  diphenhydrAMINE (BENADRYL) 25 MG tablet Take 25 mg by mouth at bedtime as needed for sleep.    [provider]  enoxaparin (LOVENOX) 40 MG/0.4ML injection Inject 0.4 mLs (40 mg total) into the skin daily for 14 days. Patient not taking: Reported on 04/16/2021  04/02/21 04/16/21  Sharen Hones, MD  feeding supplement (ENSURE ENLIVE / ENSURE PLUS) LIQD Take 237 mLs by mouth 2 (two) times daily between meals. 04/01/21   Sharen Hones, MD  HYDROcodone-acetaminophen (NORCO/VICODIN) 5-325 MG tablet Take 1-2 tablets by mouth every 6 (six) hours as needed for moderate pain. 04/01/21   Sharen Hones, MD  loratadine (CLARITIN) 10 MG tablet Take 10 mg by mouth daily as needed for allergies.    [provider]    Inpatient Medications:    stroke: mapping our early stages of recovery book   Does not  apply Once   ascorbic acid  500 mg Oral Daily   aspirin EC  81 mg Oral Daily   atorvastatin  80 mg Oral Daily   calcium carbonate  1 tablet Oral Daily   cholecalciferol  2,000 Units Oral Daily   feeding supplement  237 mL Oral BID BM   fenofibrate  54 mg Oral Daily   latanoprost  1 drop Both Eyes QHS   melatonin  5 mg Oral QHS   PARoxetine  40 mg Oral Daily   senna-docusate  1 tablet Oral Daily     Allergies:  Allergies  Allergen Reactions   Penicillins     Social History   Socioeconomic History   Marital status: Divorced    Spouse name: Not on file   Number of children: Not on file   Years of education: Not on file   Highest education level: Not on file  Occupational History   Not on file  Tobacco Use   Smoking status: Every Day    Packs/day: 1.00    Types: Cigarettes   Smokeless tobacco: Not on file  Substance and Sexual Activity   Alcohol use: No   Drug use: Never   Sexual activity: Not on file  Other Topics Concern   Not on file  Social History Narrative   Not on file   Social Determinants of Health   Financial Resource Strain: Not on file  Food Insecurity: Not on file  Transportation Needs: Not on file  Physical Activity: Not on file  Stress: Not on file  Social Connections: Not on file  Intimate Partner Violence: Not on file     History reviewed. No pertinent family history.   Review of Systems Positive for stroke Negative for: General:  chills, fever, night sweats or weight changes.  Cardiovascular: PND orthopnea syncope dizziness  Dermatological skin lesions rashes Respiratory: Cough congestion Urologic: Frequent urination urination at night and hematuria Abdominal: negative for nausea, vomiting, diarrhea, bright red blood per rectum, melena, or hematemesis Neurologic: negative for visual changes, and/or hearing changes  All other systems reviewed and are otherwise negative except as noted above.  Labs: No results for input(s): CKTOTAL,  CKMB, TROPONINI in the last 72 hours. Lab Results  Component Value Date   WBC 7.9 04/16/2021   HGB 10.3 (L) 04/16/2021   HCT 33.5 (L) 04/16/2021   MCV 101.2 (H) 04/16/2021   PLT 426 (H) 04/16/2021    Recent Labs  Lab 04/16/21 1827  NA 138  K 3.7  CL 107  CO2 24  BUN 17  CREATININE 0.95  CALCIUM 9.8  PROT 6.6  BILITOT 0.6  ALKPHOS 88  ALT 13  AST 20  GLUCOSE 127*   Lab Results  Component Value Date   CHOL 118 04/17/2021   HDL 43 04/17/2021   LDLCALC 52 04/17/2021   TRIG 114 04/17/2021   No results found for: DDIMER  Radiology/Studies:  CT ANGIO HEAD NECK W WO CM  Addendum Date: 03/31/2021   ADDENDUM REPORT: 03/31/2021 13:14 ADDENDUM: Thyroid nodules measuring up to 1.5 cm. Recommend thyroid US if clinically warranted given patient age. Reference: J Am Coll Radiol. 2015 Feb;12(2): 143-50 Electronically Signed   By: Logan Bores M.D.   On: 03/31/2021 13:14   Result Date: 03/31/2021 CLINICAL DATA:  Neuro deficit, acute, stroke suspected + aneurysm L V4 segment?. Suspected left V4 segment aneurysm on MRI. EXAM: CT ANGIOGRAPHY HEAD AND NECK TECHNIQUE: Multidetector CT imaging of the head and neck was performed using the standard protocol during bolus administration of intravenous contrast. Multiplanar CT image reconstructions and MIPs were obtained to evaluate the vascular anatomy. Carotid stenosis measurements (when applicable) are obtained utilizing NASCET criteria, using the distal internal carotid diameter as the denominator. RADIATION DOSE REDUCTION: This exam was performed according to the departmental dose-optimization program which includes automated exposure control, adjustment of the mA and/or kV according to patient size and/or use of iterative reconstruction technique. CONTRAST:  89mL OMNIPAQUE IOHEXOL 350 MG/ML SOLN COMPARISON:  Head MRI 03/30/2021 FINDINGS: CT HEAD FINDINGS Brain: There is no evidence of an acute infarct, intracranial hemorrhage, mass, midline shift,  or extra-axial fluid collection. Patchy to confluent hypodensities in the cerebral white matter bilaterally are unchanged and nonspecific but compatible with moderately extensive chronic small vessel ischemic disease. Chronic lacunar infarcts are again noted in the basal ganglia and cerebellum, and a small chronic cortical and subcortical infarct is again noted in the right frontal operculum and insula. There is mild cerebral atrophy. Vascular: Calcified atherosclerosis at the skull base. Skull: No fracture or suspicious osseous lesion. Sinuses: Paranasal sinuses and mastoid air cells are clear. Orbits: Bilateral cataract extraction. Review of the MIP images confirms the above findings CTA NECK FINDINGS Aortic arch: Standard 3 vessel aortic arch with moderate atherosclerotic plaque. Patent brachiocephalic and subclavian arteries without evidence of significant stenosis. Right carotid system: Patent with a small to moderate amount of predominantly calcified plaque at the carotid bifurcation. No evidence of a significant stenosis or dissection. Left carotid system: Patent with a small amount of predominantly calcified plaque at the carotid bifurcation. No evidence of a significant stenosis or dissection. Retropharyngeal course of the distal common carotid artery. Vertebral arteries: Patent and codominant without evidence significant stenosis or dissection. Skeleton: Advanced cervical disc and facet degeneration. Other neck: Bilateral thyroid nodules measuring up to 1.5 cm. No evidence of cervical lymphadenopathy. Upper chest: Mild interlobular septal thickening and mild ground-glass opacity in the lung apices. Small volume distal segmental and subsegmental pulmonary arterial emboli in the right upper lobe. Review of the MIP images confirms the above findings CTA HEAD FINDINGS Anterior circulation: The internal carotid arteries are patent from skull base to carotid termini with mild atherosclerotic plaque bilaterally  not resulting in significant stenosis. ACAs and MCAs are patent without evidence of a proximal branch occlusion or significant proximal stenosis. No aneurysm is identified. Posterior circulation: The intracranial vertebral arteries are widely patent to the basilar. A left PICA aneurysm measures 8 x 7 mm. The basilar artery is widely patent. Posterior communicating arteries are diminutive or absent. Both PCAs are patent with mild atherosclerotic irregularity but no evidence of a flow limiting proximal stenosis. Venous sinuses: More fully evaluated on the separate dedicated CT venogram. Anatomic variants: None. Review of the MIP images confirms the above findings IMPRESSION: 1. Distal segmental and subsegmental pulmonary arterial emboli in the right upper lobe. 2. 8 mm left PICA  aneurysm. 3. Mild atherosclerosis in the head and neck without large vessel occlusion or significant proximal stenosis. 4. Suspected mild pulmonary edema. 5. Aortic Atherosclerosis (ICD10-I70.0). Critical Value/emergent results were called by telephone at the time of interpretation on 03/31/2021 at 10:10 am to Dr. Su Monks, who verbally acknowledged these results. Electronically Signed: By: Logan Bores M.D. On: 03/31/2021 10:11   DG Chest 1 View  Result Date: 03/27/2021 CLINICAL DATA:  Weakness and fall.  Question infiltrate. EXAM: CHEST  1 VIEW COMPARISON:  04/14/2005 FINDINGS: Artifact overlies the chest. Heart size is normal. Mild aortic atherosclerotic tortuosity and calcification. The right chest is clear and normal. There is mild elevation of the left hemidiaphragm, with mild crowding of the markings at the lung base because of that, but the lung appears otherwise clear. No effusion. IMPRESSION: No acute or traumatic finding. Mild elevation of the left hemidiaphragm with mild crowding of the markings at the left base because of that. Electronically Signed   By: Nelson Chimes M.D.   On: 03/27/2021 16:13   CT HEAD WO CONTRAST  (5MM)  Result Date: 03/29/2021 CLINICAL DATA:  Neuro deficit with acute stroke suspected EXAM: CT HEAD WITHOUT CONTRAST TECHNIQUE: Contiguous axial images were obtained from the base of the skull through the vertex without intravenous contrast. RADIATION DOSE REDUCTION: This exam was performed according to the departmental dose-optimization program which includes automated exposure control, adjustment of the mA and/or kV according to patient size and/or use of iterative reconstruction technique. COMPARISON:  Head CT 03/27/2021 FINDINGS: Brain: No evidence of acute infarction, hemorrhage, hydrocephalus, extra-axial collection or mass lesion/mass effect. Chronic small vessel ischemic gliosis. Chronic small-vessel infarcts at the right frontal operculum/upper insula and right cerebellum. Chronic lacunes at the deep gray nuclei. Vascular: No hyperdense vessel or unexpected calcification. Skull: Normal. Negative for fracture or focal lesion. Sinuses/Orbits: No acute finding. IMPRESSION: 1. No acute finding. 2. Extensive chronic ischemic injury. Electronically Signed   By: Jorje Guild M.D.   On: 03/29/2021 11:48   CT HEAD WO CONTRAST (5MM)  Result Date: 03/27/2021 CLINICAL DATA:  Head trauma, minor (Age >= 65y) EXAM: CT HEAD WITHOUT CONTRAST TECHNIQUE: Contiguous axial images were obtained from the base of the skull through the vertex without intravenous contrast. RADIATION DOSE REDUCTION: This exam was performed according to the departmental dose-optimization program which includes automated exposure control, adjustment of the mA and/or kV according to patient size and/or use of iterative reconstruction technique. COMPARISON:  None. FINDINGS: Brain: No evidence of acute infarction, hemorrhage, hydrocephalus, extra-axial collection or mass lesion/mass effect. Focal area of encephalomalacia in the right frontal lobe compatible with remote ischemia. Multiple prior lacunar infarcts in the bilateral basal ganglia.  Extensive low-density changes within the periventricular and subcortical white matter compatible with chronic microvascular ischemic change. Mild diffuse cerebral volume loss. Vascular: Atherosclerotic calcifications involving the large vessels of the skull base. No unexpected hyperdense vessel. Skull: Normal. Negative for fracture or focal lesion. Sinuses/Orbits: No acute finding. Other: None. IMPRESSION: 1. No acute intracranial abnormality. 2. Extensive chronic microvascular ischemic changes and cerebral volume loss. Electronically Signed   By: Davina Poke D.O.   On: 03/27/2021 16:40   CT Angio Chest Pulmonary Embolism (PE) W or WO Contrast  Result Date: 04/17/2021 CLINICAL DATA:  Pulmonary embolism EXAM: CT ANGIOGRAPHY CHEST WITH CONTRAST TECHNIQUE: Multidetector CT imaging of the chest was performed using the standard protocol during bolus administration of intravenous contrast. Multiplanar CT image reconstructions and MIPs were obtained to evaluate the vascular  anatomy. RADIATION DOSE REDUCTION: This exam was performed according to the departmental dose-optimization program which includes automated exposure control, adjustment of the mA and/or kV according to patient size and/or use of iterative reconstruction technique. CONTRAST:  35mL OMNIPAQUE IOHEXOL 350 MG/ML SOLN COMPARISON:  04/01/2021 FINDINGS: Cardiovascular: There is homogeneous enhancement in the thoracic aorta. There is mild ectasia of ascending thoracic aorta. There are scattered coronary artery calcifications. There are small filling defects in the subsegmental branches in the right upper lobe and right lower lobe. There is interval decrease in number of filling defects in the right lower lobe. There are no new intraluminal filling defects in the pulmonary artery branches. There are no signs of right ventricular strain. Mediastinum/Nodes: No significant lymphadenopathy seen in mediastinum. Thyroid is enlarged with inhomogeneous  attenuation. Lungs/Pleura: There are no new focal pulmonary infiltrates. There is no pleural effusion or pneumothorax. Upper Abdomen: There is 4.1 cm fluid density structure in the left lobe of liver, possibly a cyst. Musculoskeletal: Unremarkable. Review of the MIP images confirms the above findings. IMPRESSION: There is interval decrease in number of filling defects in subsegmental pulmonary artery branches in the right lower lobe. There are residual small filling defects in segmental/subsegmental branches in the right lung. There are no new foci of pulmonary embolism. There is no evidence of right ventricular strain. There are no new focal pulmonary infiltrates. There is no pleural effusion. Coronary artery calcifications are seen. Other findings as described in the body of the report. These results will be called to the ordering clinician or representative by the Radiologist Assistant, and communication documented in the PACS or [employer]. Electronically Signed   By: Elmer Picker M.D.   On: 04/17/2021 12:04   CT Angio Chest Pulmonary Embolism (PE) W or WO Contrast  Result Date: 04/01/2021 CLINICAL DATA:  A female at age 66 presents with shortness of breath and concern for pulmonary embolism. EXAM: CT ANGIOGRAPHY CHEST WITH CONTRAST TECHNIQUE: Multidetector CT imaging of the chest was performed using the standard protocol during bolus administration of intravenous contrast. Multiplanar CT image reconstructions and MIPs were obtained to evaluate the vascular anatomy. RADIATION DOSE REDUCTION: This exam was performed according to the departmental dose-optimization program which includes automated exposure control, adjustment of the mA and/or kV according to patient size and/or use of iterative reconstruction technique. CONTRAST:  59mL OMNIPAQUE IOHEXOL 350 MG/ML SOLN COMPARISON:  None FINDINGS: Cardiovascular: Cardiomegaly. Signs of coronary artery calcification of LEFT and RIGHT coronary  circulation. No substantial pericardial effusion. Calcified and noncalcified atheromatous plaque of the thoracic aorta. Aorta is nonaneurysmal at 3.7 cm of the ascending thoracic aorta. Normal caliber of the descending thoracic aorta. Main pulmonary artery is well opacified as are peripheral branches, density in the main pulmonary artery 514 Hounsfield units. Bilateral subsegmental in segmental level emboli to the lower lobes. (Image 147/5) RIGHT lower lobe branch involvement at the segmental level. (Image 172/5) LEFT lower lobe subsegmental level involvement. No main pulmonary arterial or lobar level emboli. RIGHT upper lobe segmental pulmonary arterial embolus (image 97/5) Mediastinum/Nodes: No thoracic inlet, axillary, mediastinal or hilar adenopathy. Esophagus grossly normal. Lungs/Pleura: Basilar atelectasis and small pleural effusions. No lobar consolidation. Scarring along the RIGHT heart border. Airways are patent. Upper Abdomen: Incidental imaging of upper abdominal contents with incomplete imaging of a moderately large hepatic cyst, purely cystic with respect to visualized portions. Adrenal glands are normal. Musculoskeletal: No acute musculoskeletal process. Spinal degenerative changes. Review of the MIP images confirms the above findings. IMPRESSION: 1.  Positive for RIGHT-sided segmental and subsegmental pulmonary emboli and LEFT lower lobe subsegmental pulmonary embolism. 2. Small bilateral pleural effusions with basilar atelectasis. 3. Cardiomegaly with coronary artery calcification of LEFT and RIGHT coronary circulation. 4. Aortic atherosclerosis. Aortic Atherosclerosis (ICD10-I70.0). Critical Value/emergent results were called by telephone at the time of interpretation on 04/01/2021 at 12:51 pm to provider [facility] , who verbally acknowledged these results. Electronically Signed   By: Zetta Bills M.D.   On: 04/01/2021 12:52   CT Cervical Spine Wo Contrast  Result Date: 03/27/2021 CLINICAL  DATA:  Neck trauma EXAM: CT CERVICAL SPINE WITHOUT CONTRAST TECHNIQUE: Multidetector CT imaging of the cervical spine was performed without intravenous contrast. Multiplanar CT image reconstructions were also generated. RADIATION DOSE REDUCTION: This exam was performed according to the departmental dose-optimization program which includes automated exposure control, adjustment of the mA and/or kV according to patient size and/or use of iterative reconstruction technique. COMPARISON:  None. FINDINGS: Alignment: Grade 1 anterolisthesis of C3 on C4 and C7 on T1. No evidence of acute subluxation. Skull base and vertebrae: No acute fracture. No primary bone lesion or focal pathologic process. Soft tissues and spinal canal: No prevertebral fluid or swelling. No visible canal hematoma. Disc levels: Moderate to severe intervertebral disc space narrowing from C4 through C7. Associated endplate sclerosis and dorsal endplate osteophytes most prominent at C5-C6. Bilateral facet arthropathy. Mild neural foraminal narrowing at C5-C6 on the right. Upper chest: Heterogeneous thyroid gland with likely nodules on the right measuring up to 1.5 cm. Mild biapical pleural thickening. Other: None. IMPRESSION: 1. No acute fracture or subluxation identified. Degenerative changes of the cervical spine. 2. Heterogeneous nodular thyroid gland, consider follow-up ultrasound. Electronically Signed   By: Ofilia Neas M.D.   On: 03/27/2021 16:37   MR BRAIN WO CONTRAST  Addendum Date: 04/16/2021   ADDENDUM REPORT: 04/16/2021 13:28 ADDENDUM: These results were called by telephone at the time of interpretation on 04/16/2021 at 1:27 pm to provider TOCHUKWU AGBATA , who verbally acknowledged these results. Electronically Signed   By: Franchot Gallo M.D.   On: 04/16/2021 13:28   Result Date: 04/16/2021 CLINICAL DATA:  Acute neuro deficit EXAM: MRI HEAD WITHOUT CONTRAST TECHNIQUE: Multiplanar, multiecho pulse sequences of the brain and  surrounding structures were obtained without intravenous contrast. COMPARISON:  CT angio head and neck 04/16/2021.  MRI head 03/20/2021 FINDINGS: Brain: Small diffusion hyperintensity in the left insula. There is associated susceptibility in this area. These findings were not present on the recent MRI of 03/30/2021 therefore likely represent small acute hemorrhagic infarct. Mild atrophy. Moderate chronic microvascular ischemic change in the white matter, basal ganglia, and pons. Small chronic infarcts in the cerebellum bilaterally. Chronic microhemorrhage also in the right frontal lobe Vascular: Normal arterial flow voids at the skull base. 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 Skull and upper cervical spine: No focal skeletal lesion. Sinuses/Orbits: Paranasal sinuses clear. Bilateral cataract extraction Other: None IMPRESSION: Probable small acute infarct in the left insular cortex with associated mild hemorrhage. 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 Electronically Signed: By: Franchot Gallo M.D. On: 04/16/2021 12:48   MR BRAIN WO CONTRAST  Result Date: 03/30/2021 CLINICAL DATA:  Right facial droop following hip surgery EXAM: MRI HEAD WITHOUT CONTRAST TECHNIQUE: Multiplanar, multiecho pulse sequences of the brain and surrounding structures were obtained without intravenous contrast. COMPARISON:  Noncontrast CT head dated  1 day prior FINDINGS: Brain: There is no evidence of acute intracranial hemorrhage, extra-axial fluid collection, or acute infarct. There is mild global parenchymal volume loss with prominence of the ventricular system and extra-axial CSF spaces. There are multiple remote infarcts in the bilateral basal ganglia, bilateral cerebellar hemispheres, and right frontal lobe. There is extensive patchy FLAIR signal abnormality in the subcortical and  periventricular white matter likely reflecting sequela of moderate chronic white matter microangiopathy. There is no mass lesion.  There is no midline shift. Vascular: The major arterial flow voids are present. There is a suspected 6 mm aneurysm arising from the left V4 segment (10-6). There is abnormal T2 hyperintensity in the left sigmoid sinus. Skull and upper cervical spine: Normal marrow signal. Sinuses/Orbits: The paranasal sinuses are clear. Bilateral lens implants are in place. The globes and orbits are otherwise unremarkable. Other: None. IMPRESSION: 1. Abnormal T2 signal in the left sigmoid sinus could reflect slow flow; however, this could also reflect venous sinus thrombosis. 2. Suspected aneurysm arising from the left V4 segment. Recommend CTA and CTV for further evaluation of both of these findings. 3. Otherwise, no acute intracranial pathology. 4. Multiple remote infarcts on a background of moderate chronic white matter microangiopathy as above. Electronically Signed   By: Valetta Mole M.D.   On: 03/30/2021 18:21   CT ABDOMEN PELVIS W CONTRAST  Result Date: 04/17/2021 CLINICAL DATA:  Inpatient. History of ovarian cancer. History of recent pulmonary embolism and acute CVA. Restaging. EXAM: CT ABDOMEN AND PELVIS WITH CONTRAST TECHNIQUE: Multidetector CT imaging of the abdomen and pelvis was performed using the standard protocol following bolus administration of intravenous contrast. RADIATION DOSE REDUCTION: This exam was performed according to the departmental dose-optimization program which includes automated exposure control, adjustment of the mA and/or kV according to patient size and/or use of iterative reconstruction technique. CONTRAST:  55mL OMNIPAQUE IOHEXOL 350 MG/ML SOLN COMPARISON:  04/15/2005 CT abdomen/pelvis. FINDINGS: Lower chest: No significant pulmonary nodules or acute consolidative airspace disease. Coronary atherosclerosis. Hepatobiliary: Normal liver size. Simple 3.9 cm upper  left liver cyst. No additional liver lesions. Cholecystectomy. No biliary ductal dilatation. Multiple small to moderate periampullary duodenal diverticula. Pancreas: Normal, with no mass or duct dilation. Spleen: Normal size. No mass. Adrenals/Urinary Tract: Normal adrenals. Hypodense 1.9 cm inferior left renal cortical lesion, new from 2007 CT. Simple 1.7 cm interpolar left renal cortical cyst. Subcentimeter hypodense upper left renal cortical lesion, too small to characterize. No hydronephrosis. Normal nondistended bladder. Stomach/Bowel: Normal non-distended stomach. Normal caliber small bowel with no small bowel wall thickening. Apparent appendectomy. Moderate rectal stool. Minimal left colonic diverticulosis. Borderline mild wall thickening with slight haziness of the pericolonic fat in the cecum and ascending colon. Vascular/Lymphatic: Atherosclerotic nonaneurysmal abdominal aorta. Patent portal, splenic, hepatic and renal veins. No pathologically enlarged lymph nodes in the abdomen or pelvis. Reproductive: Status post hysterectomy, with no abnormal findings at the vaginal cuff. No adnexal mass. Other: No pneumoperitoneum, ascites or focal fluid collection. No discrete peritoneal nodularity. Musculoskeletal: No aggressive appearing focal osseous lesions. Moderate thoracolumbar spondylosis. Intact left femoral neck pins. IMPRESSION: 1. No evidence of metastatic disease in the abdomen or pelvis. 2. Borderline mild right colonic wall thickening and slight haziness of the pericolonic fat, cannot exclude a minimal infectious or inflammatory colitis. 3. Indeterminate hypodense 1.9 cm inferior left renal cortical lesion, new from 2007 CT. Dedicated outpatient MRI (preferred) or CT abdomen without and with IV contrast is indicated for further characterization and may be obtained as clinically  warranted. 4. Minimal left colonic diverticulosis. 5. Coronary atherosclerosis. 6. Aortic Atherosclerosis (ICD10-I70.0).  Electronically Signed   By: Ilona Sorrel M.D.   On: 04/17/2021 12:37   US Venous Img Lower Bilateral (DVT)  Result Date: 03/31/2021 CLINICAL DATA:  88 year old female with history of pulmonary embolism. EXAM: BILATERAL LOWER EXTREMITY VENOUS DOPPLER ULTRASOUND TECHNIQUE: Gray-scale sonography with graded compression, as well as color Doppler and duplex ultrasound were performed to evaluate the lower extremity deep venous systems from the level of the common femoral vein and including the common femoral, femoral, profunda femoral, popliteal and calf veins including the posterior tibial, peroneal and gastrocnemius veins when visible. The superficial great saphenous vein was also interrogated. Spectral Doppler was utilized to evaluate flow at rest and with distal augmentation maneuvers in the common femoral, femoral and popliteal veins. COMPARISON:  None. FINDINGS: RIGHT LOWER EXTREMITY Common Femoral Vein: No evidence of thrombus. Normal compressibility, respiratory phasicity and response to augmentation. Saphenofemoral Junction: No evidence of thrombus. Normal compressibility and flow on color Doppler imaging. Profunda Femoral Vein: No evidence of thrombus. Normal compressibility and flow on color Doppler imaging. Femoral Vein: No evidence of thrombus. Normal compressibility, respiratory phasicity and response to augmentation. Popliteal Vein: No evidence of thrombus. Normal compressibility, respiratory phasicity and response to augmentation. Calf Veins: No evidence of thrombus. Normal compressibility and flow on color Doppler imaging. Other Findings:  None. LEFT LOWER EXTREMITY Common Femoral Vein: No evidence of thrombus. Normal compressibility, respiratory phasicity and response to augmentation. Saphenofemoral Junction: No evidence of thrombus. Normal compressibility and flow on color Doppler imaging. Profunda Femoral Vein: No evidence of thrombus. Normal compressibility and flow on color Doppler imaging.  Femoral Vein: No evidence of thrombus. Normal compressibility, respiratory phasicity and response to augmentation. Popliteal Vein: No evidence of thrombus. Normal compressibility, respiratory phasicity and response to augmentation. Calf Veins: No evidence of thrombus. Normal compressibility and flow on color Doppler imaging. Other Findings:  None. IMPRESSION: No evidence of bilateral lower extremity deep venous thrombosis. Ruthann Cancer, MD [facility] Electronically Signed   By: Ruthann Cancer M.D.   On: 03/31/2021 12:17   DG Knee Complete 4 Views Left  Result Date: 03/27/2021 CLINICAL DATA:  [place] yesterday.  Knee pain. EXAM: LEFT KNEE - COMPLETE 4+ VIEW COMPARISON:  None. FINDINGS: No joint effusion. No fracture. Chronic appearing joint space narrowing with chondrocalcinosis, typical of age. No focal lesion. IMPRESSION: No acute or traumatic finding. Joint space narrowing and chondrocalcinosis. Electronically Signed   By: Nelson Chimes M.D.   On: 03/27/2021 16:11   DG C-Arm 1-60 Min-No Report  Result Date: 03/28/2021 Fluoroscopy was utilized by the requesting physician.  No radiographic interpretation.   ECHOCARDIOGRAM COMPLETE  Result Date: 04/17/2021    ECHOCARDIOGRAM REPORT   Patient Name:   Chelsea Hart Date of Exam: 04/17/2021 Medical Rec #:  [id number]      Height:       63.0 in Accession #:    [id number]     Weight:       155.0 lb Date of Birth:  January 20, 1934       BSA:          1.735 m Patient Age:    48 years       BP:           132/61 mmHg Patient Gender: F              HR:  51 bpm. Exam Location:  [facility] Procedure: 2D Echo, Color Doppler and Cardiac Doppler Indications:     Stroke I63.9  History:         Patient has no prior history of Echocardiogram examinations.                  Anxiety.  Sonographer:     Sherrie Sport Referring Phys:  [place] Diagnosing Phys: Serafina Royals MD  Sonographer Comments: Suboptimal apical  window. IMPRESSIONS  1. Left ventricular ejection fraction, by estimation, is 60 to 65%. The left ventricle has normal function. The left ventricle has no regional wall motion abnormalities. Left ventricular diastolic parameters were normal.  2. Right ventricular systolic function is normal. The right ventricular size is normal.  3. The mitral valve is normal in structure. Mild mitral valve regurgitation.  4. The aortic valve is normal in structure. Aortic valve regurgitation is not visualized. FINDINGS  Left Ventricle: Left ventricular ejection fraction, by estimation, is 60 to 65%. The left ventricle has normal function. The left ventricle has no regional wall motion abnormalities. The left ventricular internal cavity size was normal in size. There is  no left ventricular hypertrophy. Left ventricular diastolic parameters were normal. Right Ventricle: The right ventricular size is normal. No increase in right ventricular wall thickness. Right ventricular systolic function is normal. Left Atrium: Left atrial size was normal in size. Right Atrium: Right atrial size was normal in size. Pericardium: There is no evidence of pericardial effusion. Mitral Valve: The mitral valve is normal in structure. Mild mitral valve regurgitation. MV peak gradient, 8.2 mmHg. The mean mitral valve gradient is 2.0 mmHg. Tricuspid Valve: The tricuspid valve is normal in structure. Tricuspid valve regurgitation is mild. Aortic Valve: The aortic valve is normal in structure. Aortic valve regurgitation is not visualized. Aortic valve mean gradient measures 6.0 mmHg. Aortic valve peak gradient measures 9.2 mmHg. Aortic valve area, by VTI measures 4.24 cm. Pulmonic Valve: The pulmonic valve was normal in structure. Pulmonic valve regurgitation is not visualized. Aorta: The aortic root and ascending aorta are structurally normal, with no evidence of dilitation. IAS/Shunts: No atrial level shunt detected by color flow Doppler.  LEFT VENTRICLE  PLAX 2D LVIDd:         5.00 cm   Diastology LVIDs:         3.40 cm   LV e' medial:    4.57 cm/s LV PW:         1.20 cm   LV E/e' medial:  21.4 LV IVS:        1.00 cm   LV e' lateral:   6.64 cm/s LVOT diam:     2.00 cm   LV E/e' lateral: 14.7 LV SV:         146 LV SV Index:   84 LVOT Area:     3.14 cm  RIGHT VENTRICLE RV Basal diam:  3.50 cm RV S prime:     15.70 cm/s TAPSE (M-mode): 2.6 cm LEFT ATRIUM              Index        RIGHT ATRIUM           Index LA diam:        3.90 cm  2.25 cm/m   RA Area:     17.30 cm LA Vol (A2C):   122.0 ml 70.31 ml/m  RA Volume:   55.10 ml  31.76 ml/m LA Vol (A4C):  73.5 ml  42.36 ml/m LA Biplane Vol: 105.0 ml 60.51 ml/m  AORTIC VALVE                     PULMONIC VALVE AV Area (Vmax):    3.58 cm      PV Vmax:        0.88 m/s AV Area (Vmean):   3.80 cm      PV Vmean:       60.000 cm/s AV Area (VTI):     4.24 cm      PV VTI:         0.196 m AV Vmax:           152.00 cm/s   PV Peak grad:   3.1 mmHg AV Vmean:          110.000 cm/s  PV Mean grad:   2.0 mmHg AV VTI:            0.344 m       RVOT Peak grad: 6 mmHg AV Peak Grad:      9.2 mmHg AV Mean Grad:      6.0 mmHg LVOT Vmax:         173.00 cm/s LVOT Vmean:        133.000 cm/s LVOT VTI:          0.464 m LVOT/AV VTI ratio: 1.35  AORTA Ao Root diam: 3.37 cm MITRAL VALVE                TRICUSPID VALVE MV Area (PHT): 2.47 cm     TR Peak grad:   24.4 mmHg MV Area VTI:   3.49 cm     TR Vmax:        247.00 cm/s MV Peak grad:  8.2 mmHg MV Mean grad:  2.0 mmHg     SHUNTS MV Vmax:       1.43 m/s     Systemic VTI:  0.46 m MV Vmean:      70.1 cm/s    Systemic Diam: 2.00 cm MV Decel Time: 307 msec     Pulmonic VTI:  0.253 m MV E velocity: 97.70 cm/s MV A velocity: 109.00 cm/s MV E/A ratio:  0.90 Serafina Royals MD Electronically signed by Serafina Royals MD Signature Date/Time: 04/17/2021/12:19:31 PM    Final    CT VENOGRAM HEAD  Result Date: 03/31/2021 CLINICAL DATA:  Dural venous sinus thrombosis suspected. Abnormal T2 signal in the  left sigmoid sinus on MRI brain. EXAM: CT VENOGRAM HEAD TECHNIQUE: Venographic phase images of the brain were obtained following the administration of intravenous contrast. Multiplanar reformats and maximum intensity projections were generated. RADIATION DOSE REDUCTION: This exam was performed according to the departmental dose-optimization program which includes automated exposure control, adjustment of the mA and/or kV according to patient size and/or use of iterative reconstruction technique. CONTRAST:  54mL OMNIPAQUE IOHEXOL 350 MG/ML SOLN COMPARISON:  Head MRI 03/30/2021 FINDINGS: The superior sagittal sinus, internal cerebral veins, vein of Galen, straight sinus, transverse sinuses, sigmoid sinuses, and jugular bulbs are patent without evidence of thrombus. The right transverse and sigmoid sinuses are dominant. An arachnoid granulation is noted in the lateral aspect of the left transverse sinus. T2 hyperintensity in the left sigmoid sinus on MRI likely reflects slow flow. IMPRESSION: No evidence of dural venous sinus thrombosis. Electronically Signed   By: Logan Bores M.D.   On: 03/31/2021 10:01   DG HIP UNILAT WITH PELVIS 2-3 VIEWS LEFT  Result  Date: 03/28/2021 CLINICAL DATA:  Fracture neck of left femur, fluoroscopic assistance for internal fixation EXAM: DG HIP (WITH OR WITHOUT PELVIS) 2-3V LEFT COMPARISON:  03/27/2021 FINDINGS: Fluoroscopic images show 3 surgical screws transfixing the subcapital fracture of left femur. Fluoroscopic time was 22 seconds. Radiation dose is 4.1 mGy. IMPRESSION: Fluoroscopic assistance was provided for internal fixation of fracture of neck of left femur. Electronically Signed   By: Elmer Picker M.D.   On: 03/28/2021 20:02   DG Hip Unilat W or Wo Pelvis 2-3 Views Left  Result Date: 03/27/2021 CLINICAL DATA:  [place] yesterday with left hip pain. EXAM: DG HIP (WITH OR WITHOUT PELVIS) 2-3V LEFT COMPARISON:  None. FINDINGS: There is a fracture of the left femoral neck  with slight impaction. No intertrochanteric component. The bones of the pelvis are negative. IMPRESSION: Left femoral neck fracture with minimal impaction. Electronically Signed   By: Nelson Chimes M.D.   On: 03/27/2021 16:10   CT HEAD CODE STROKE WO CONTRAST  Result Date: 04/16/2021 CLINICAL DATA:  Code stroke.  Slurred speech, weakness, drooling EXAM: CT HEAD WITHOUT CONTRAST TECHNIQUE: Contiguous axial images were obtained from the base of the skull through the vertex without intravenous contrast. RADIATION DOSE REDUCTION: This exam was performed according to the departmental dose-optimization program which includes automated exposure control, adjustment of the mA and/or kV according to patient size and/or use of iterative reconstruction technique. COMPARISON:  CT/CTA head and neck 03/31/2021, MR head 03/30/2021 FINDINGS: Brain: There is no evidence of acute intracranial hemorrhage, extra-axial fluid collection, or acute infarct. Parenchymal volume is within normal limits for age. The ventricles are stable in size. 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 Small remote infarcts are again seen in the bilateral basal ganglia and cerebellar hemispheres. Additional patchy hypodensity in the subcortical and periventricular white matter likely reflects sequela of moderate chronic white matter microangiopathy. There is no mass lesion.  There is no midline shift. Vascular: There is calcification of the bilateral cavernous ICAs. 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 Skull: Normal. Negative for fracture or focal lesion. Sinuses/Orbits: The imaged paranasal sinuses are clear. Bilateral lens implants are in place. The globes and orbits are otherwise unremarkable. Other: None. ASPECTS [facility] Stroke Program Early CT Score) - Ganglionic level infarction (caudate, lentiform nuclei, internal capsule, insula, M1-M3 cortex): 7 - Supraganglionic infarction (M4-M6 cortex): 3 Total score (0-10 with 10  being normal): 10 IMPRESSION: 1. No acute intracranial pathology. 2. ASPECTS is 10 These results were called by telephone at the time of interpretation on 04/16/2021 at 9:48 am to provider [facility] , who verbally acknowledged these results. Electronically Signed   By: Valetta Mole M.D.   On: 04/16/2021 09:51   CT ANGIO HEAD NECK W WO CM (CODE STROKE)  Result Date: 04/16/2021 CLINICAL DATA:  88 year old female code stroke presentation. Known left PICA aneurysm. EXAM: CT ANGIOGRAPHY HEAD AND NECK TECHNIQUE: Multidetector CT imaging of the head and neck was performed using the standard protocol during bolus administration of intravenous contrast. Multiplanar CT image reconstructions and MIPs were obtained to evaluate the vascular anatomy. Carotid stenosis measurements (when applicable) are obtained utilizing NASCET criteria, using the distal internal carotid diameter as the denominator. RADIATION DOSE REDUCTION: This exam was performed according to the departmental dose-optimization program which includes automated exposure control, adjustment of the mA and/or kV according to patient size and/or use of iterative reconstruction technique. CONTRAST:  36mL OMNIPAQUE IOHEXOL 350 MG/ML SOLN COMPARISON:  Plain head CT 0943 hours today. CTA head and neck 03/31/2021. FINDINGS: CTA NECK Skeleton: Torus palatini is, normal variant, and absent maxillary dentition again noted. 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 No acute osseous abnormality identified. Upper chest: Negative; Visible upper lobe pulmonary artery branches now appear grossly patent. Other neck: Heterogeneously enlarged right thyroid lobe with hypodense nodules individually about 14 mm, stable. Otherwise negative. Aortic arch: 3 vessel arch configuration. Calcified aortic atherosclerosis. Right carotid system: Brachiocephalic artery plaque without stenosis. Tortuous proximal right CCA. Calcified plaque at the right ICA origin and bulb is  stable with less than 50 % stenosis with respect to the distal vessel. Left carotid system: Mild left CCA origin plaque without stenosis. Tortuous proximal left CCA. Stable calcified plaque at the left ICA origin without stenosis. Vertebral arteries: Stable proximal right subclavian artery calcified plaque without stenosis. Normal right vertebral artery origin. Right vertebral artery is patent to the skull base without stenosis. Mild contralateral left subclavian artery plaque without stenosis. Normal left vertebral artery origin. Tortuous left V1 segment. Left vertebral artery is patent to the skull base without stenosis. CTA HEAD Posterior circulation: Fairly codominant distal vertebral arteries are patent to the vertebrobasilar junction without stenosis. Fairly large, saccular, 8-9 mm aneurysm of the right vertebral artery V4 segment at or near the left PICA origin is stable on series 7, image 122. Normal right PICA origin. Patent basilar artery without stenosis. Normal SCA and PCA origins. Posterior communicating arteries are diminutive or absent. Bilateral PCA branches are within normal limits. Anterior circulation: Both ICA siphons are patent mild to moderate siphon calcified plaque mostly in the cavernous and supraclinoid segments appears stable with only mild stenosis on the left. Normal ophthalmic artery origins. Patent carotid termini. Patent MCA and ACA origins. Diminutive or absent anterior communicating artery. Bilateral ACA branches are stable and within normal limits. Left MCA M1 segment and bifurcation are patent without stenosis. However, there is a left M3 branch occlusion well demonstrated on series 12, image 29, also on series 9, image 140 and series 7, image 80. This is in the middle sylvian division. Other left MCA branches appear stable. Right MCA M1 segment and trifurcation are patent without stenosis. Right MCA branches are within normal limits. Venous sinuses: Patent. Anatomic variants:  None. Review of the MIP images confirms the above findings IMPRESSION: 1. Negative for LV0 but Positive for a new Left MCA M3 branch occlusion in the middle Sylvian division. 2. 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 3. Stable carotid andatherosclerosis without significant stenosis. Aortic Atherosclerosis (ICD10-I70.0). 4. Stable right thyroid Nodules. In the setting of significant comorbidities or limited life expectancy, no follow-up recommended (ref: J Am Coll Radiol. 2015 Feb;12(2): 143-50). Study discussed by telephone with Dr. Valora Piccolo on 04/16/2021 at 10:09 . Electronically Signed   By: Genevie Ann M.D.   On: 04/16/2021 10:11    EKG: Normal sinus rhythm  Weights: Filed Weights   04/16/21 0953  Weight: 70.3 kg     Physical Exam: Blood pressure (!) 145/67, pulse 65, temperature 98.4 F (36.9 C), temperature source Oral, resp. rate 16, height 5\' 3"  (1.6 m), weight 70.3 kg, SpO2 96 %. Body mass index is 27.46 kg/m. General: Well developed, well nourished, in no acute distress. Head eyes ears nose throat: Normocephalic, atraumatic, sclera non-icteric, no xanthomas, nares are without discharge. No apparent thyromegaly and/or mass  Lungs: Normal respiratory effort.  no wheezes, no rales, no rhonchi.  Heart:  RRR with normal S1 S2. no murmur gallop, no rub, PMI is normal size and placement, carotid upstroke normal without bruit, jugular venous pressure is normal Abdomen: Soft, non-tender, non-distended with normoactive bowel sounds. No hepatomegaly. No rebound/guarding. No obvious abdominal masses. Abdominal aorta is normal size without bruit Extremities: No edema. no cyanosis, no clubbing, no ulcers  Peripheral : 2+ bilateral upper extremity pulses, 2+ bilateral femoral pulses, 2+ bilateral dorsal pedal pulse Neuro: Alert and oriented. No facial asymmetry. No focal deficit. Moves all extremities spontaneously.  But does have some  dysphagia Musculoskeletal: Normal muscle tone without kyphosis Psych:  Responds to questions appropriately with a normal affect.    Assessment: 88 year old female with hypertension hyperlipidemia with left middle cerebral infarct possibly from an embolic phenomenon on appropriate medication management needing further evaluation and treatment options  Plan: 1.  Proceed to transesophageal echocardiogram for further evaluation possible embolic phenomenon.  Patient understands the risk and benefits of transesophageal echocardiogram including the possibility of death stroke esophageal perforation and side effects of medications.  The patient is at low risk for moderate sedation  Signed, Corey Skains M.D. [facility] 04/17/2021, 5:25 PM

## 2021-04-17 NOTE — Assessment & Plan Note (Addendum)
CT scan again showing pulmonary emboli but no new emboli from last exam.  Decision last admission was not to anticoagulate because these were asymptomatic and they did not want the increased risk of bleeding with the cerebral aneurysm.

## 2021-04-17 NOTE — Progress Notes (Signed)
Neurology Progress Note Chelsea Hart MR# [id number] 04/17/2021  S: no overnight events; no new complaints.  O: Current vital signs: BP (!) 127/59    Pulse (!) 53    Temp 98.9 F (37.2 C) (Oral)    Resp 18    Ht 5\' 3"  (1.6 m)    Wt 70.3 kg    SpO2 99%    BMI 27.46 kg/m  Vital signs in last 24 hours: Temp:  [97.7 F (36.5 C)-98.9 F (37.2 C)] 98.9 F (37.2 C) (02/16 0837) Pulse Rate:  [51-71] 53 (02/16 0837) Resp:  [15-19] 18 (02/16 0837) BP: (100-186)/(57-78) 127/59 (02/16 0837) SpO2:  [97 %-100 %] 99 % (02/16 0837) Weight:  [70.3 kg] 70.3 kg (02/15 0953) GENERAL: Awake, alert in NAD HEENT: Normocephalic and atraumatic, moist mm, no LN++, no thyromegaly LUNGS: symmetric excursions bilaterally with no audible wheezes. CV: RR, equal pulses bilaterally. ABDOMEN: Soft, nontender, nondistended with normoactive BS Ext: warm, well perfused, intact peripheral pulses  NEURO:  Mental Status: AA&Ox3  Language: speech is fluent.  Intact naming, repetition, and comprehension. PERR. EOMI, visual fields full, no facial asymmetry, facial sensation intact, hearing intact. No evidence of tongue atrophy or fibrillations, tongue/uvula/soft palate midline elevates symmetrically  Normal sternocleidomastoid and trapezius muscle strength. Motor: good strength in all extremities. Tone: Tone and bulk is normal Sensation: Intact to light touch bilaterally Coordination: FTN intact bilaterally, no ataxia in BLE. Gait - Deferred  NIHSS 0  Labs No results found for: HGBA1C and  Lab Results  Component Value Date   HGBA1C 5.4 04/17/2021   and  Lab Results  Component Value Date   LDLCALC 52 04/17/2021    Imaging  CT abdomen and pelvis read as no evidence of metastatic disease in the abdomen or pelvis. Borderline mild right colonic wall thickening and slight haziness of the pericolonic fat, cannot exclude a minimal infectious or inflammatory colitis.Indeterminate hypodense 1.9 cm inferior left renal  cortical lesion, new from 2007 CT. Dedicated outpatient MRI (preferred) or CT abdomen without and with IV contrast is indicated for further characterization and may be obtained as clinically warranted. Minimal left colonic diverticulosis.  CT chest PE protocol read as interval decrease in number of filling defects in subsegmental pulmonary artery branches in the right lower lobe. There are residual small filling defects in segmental/subsegmental branches in the right lung. There are no new foci of pulmonary embolism. There is no evidence of right ventricular strain.  TTE read as Left ventricular ejection fraction, by estimation, is 60 to 65%. The left has no regional wall motion abnormalities, no shunt or mention of LVT.   Assessment: Chelsea Hart is a 88 y.o. female PMHx with aphasia which resolved.  She is on full dose LMWH and symptoms resolved and not a candidate for thrombolysis. CTA head showed left M3 occlusion and she was not a candidate for intervention.    Recent bilateral PE 03/2021 and was on LMWH with the last dose administered the morning of presentation and left ventricular thrombus remains in the differential. She also has a remote history of uterine cancer (s/p hysterectomy in remission) and is a smoker and therefore will need to consider paraneoplastic hypercoagulable state. Thus far, CT chest/abdomen/pelvis did not show mass lesion.    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 Left M3 occlusion. Recent bilateral pulmonary emboli 04/01/2021 was on LMWH. HTN HLD Right V4 segment/PICA unruptured aneurysm - Neurosurgery follow up scheduled. Smoker.     Plan: - Discuss with  cardiology for TEE. - Continue statin for goal LDL <70. - Aspirin 81mg  daily. - SBP goal <140. - Telemetry monitoring for arrhythmia. - Stroke education. - Continue PT/OT/SLP. - Patient encouraged to stop smoking due to aneurysm growth.  Electronically signed by:  Lynnae Sandhoff, MD Page: [phone] 04/17/2021, 9:32 AM  If 7pm- 7am, please page neurology on call as listed in [place].

## 2021-04-17 NOTE — Progress Notes (Signed)
*  PRELIMINARY RESULTS* Echocardiogram 2D Echocardiogram has been performed.  Chelsea Hart 04/17/2021, 7:49 AM

## 2021-04-17 NOTE — Assessment & Plan Note (Signed)
1.9 cm inferior left renal cortical lesion.  (New from 2017 CT scan) will recommend an MRI as outpatient for further characterization.

## 2021-04-17 NOTE — Assessment & Plan Note (Signed)
Completed rehab for recent left hip fracture.  Physical therapy recommending home with home health.

## 2021-04-18 ENCOUNTER — Observation Stay
Admit: 2021-04-18 | Discharge: 2021-04-18 | Disposition: A | Discharge status: Home / Self Care | Payer: Medicare Other | Attending: Internal Medicine | Admitting: Internal Medicine

## 2021-04-18 ENCOUNTER — Encounter
Admission: EM | Disposition: A | Discharge status: Home Health | Payer: Self-pay | Source: Home / Self Care | Attending: Internal Medicine

## 2021-04-18 DIAGNOSIS — I2699 Other pulmonary embolism without acute cor pulmonale: Secondary | ICD-10-CM | POA: Diagnosis not present

## 2021-04-18 DIAGNOSIS — I671 Cerebral aneurysm, nonruptured: Secondary | ICD-10-CM | POA: Diagnosis not present

## 2021-04-18 DIAGNOSIS — I10 Essential (primary) hypertension: Secondary | ICD-10-CM | POA: Diagnosis not present

## 2021-04-18 DIAGNOSIS — R4781 Slurred speech: Secondary | ICD-10-CM | POA: Diagnosis not present

## 2021-04-18 DIAGNOSIS — I639 Cerebral infarction, unspecified: Secondary | ICD-10-CM | POA: Diagnosis not present

## 2021-04-18 DIAGNOSIS — I63412 Cerebral infarction due to embolism of left middle cerebral artery: Secondary | ICD-10-CM | POA: Diagnosis not present

## 2021-04-18 HISTORY — PX: TEE WITHOUT CARDIOVERSION: SHX5443

## 2021-04-18 LAB — ANTITHROMBIN III: AntiThromb III Func: 120 % (ref 75–120)

## 2021-04-18 SURGERY — ECHOCARDIOGRAM, TRANSESOPHAGEAL
Anesthesia: Moderate Sedation

## 2021-04-18 MED ORDER — METOPROLOL SUCCINATE ER 25 MG PO TB24: 25.0000 mg | ORAL_TABLET | Freq: Every day | ORAL | 0 refills | Status: DC

## 2021-04-18 MED ORDER — ATORVASTATIN CALCIUM 40 MG PO TABS: 40.0000 mg | ORAL_TABLET | Freq: Every day | ORAL | 1 refills | Status: DC

## 2021-04-18 MED ORDER — MIDAZOLAM HCL 2 MG/2ML IJ SOLN
INTRAMUSCULAR | Status: AC | PRN
  Administered 2021-04-18: 2 mg via INTRAVENOUS

## 2021-04-18 MED ORDER — MIDAZOLAM HCL 2 MG/2ML IJ SOLN
INTRAMUSCULAR | Status: AC
  Filled 2021-04-18: qty 4

## 2021-04-18 MED ORDER — SODIUM CHLORIDE 0.9 % IV SOLN: INTRAVENOUS | Status: DC

## 2021-04-18 MED ORDER — BUTAMBEN-TETRACAINE-BENZOCAINE 2-2-14 % EX AERO
INHALATION_SPRAY | CUTANEOUS | Status: AC
  Filled 2021-04-18: qty 5

## 2021-04-18 MED ORDER — FENTANYL CITRATE (PF) 100 MCG/2ML IJ SOLN
INTRAMUSCULAR | Status: AC | PRN
  Administered 2021-04-18 (×2): 25 ug via INTRAVENOUS

## 2021-04-18 MED ORDER — CLOPIDOGREL BISULFATE 75 MG PO TABS
75.0000 mg | ORAL_TABLET | Freq: Every day | ORAL | Status: DC
  Administered 2021-04-18: 75 mg via ORAL
  Filled 2021-04-18: qty 1

## 2021-04-18 MED ORDER — SODIUM CHLORIDE FLUSH 0.9 % IV SOLN
INTRAVENOUS | Status: AC
  Filled 2021-04-18: qty 10

## 2021-04-18 MED ORDER — CLOPIDOGREL BISULFATE 75 MG PO TABS: 75.0000 mg | ORAL_TABLET | Freq: Every day | ORAL | 0 refills | Status: AC

## 2021-04-18 MED ORDER — LIDOCAINE VISCOUS HCL 2 % MT SOLN
OROMUCOSAL | Status: AC
  Filled 2021-04-18: qty 15

## 2021-04-18 MED ORDER — FENTANYL CITRATE (PF) 100 MCG/2ML IJ SOLN
INTRAMUSCULAR | Status: AC
  Filled 2021-04-18: qty 2

## 2021-04-18 NOTE — Progress Notes (Signed)
Physical Therapy Treatment Patient Details Name: Chelsea Hart MRN: [id number] DOB: 10-Jan-1934 Today's Date: 04/18/2021   History of Present Illness Patient is an 88 year old female who reported to [facility] ED today 04/16/21 with reports of difficulty with speech. Admitted for Acute CVA. Patient has a PMH of left PICA aneurysm and pulmonary embolism.    PT Comments    Therapist in to see pt prior to procedure. Pt resting in bed, family at bedside. Pt agreed to OOB activity. Pt completed gait training 266ft with support of RW and SBA/CG to prevent pt reaching out for railing for support. Anticipate once strength returns, a cane might be more appropriate. Pt completed unsupported standing dynamic reaching once back in room and safe commode transfers. Strength seems to be improving. Pt awaiting d/c to son's home with HHPT.    Recommendations for follow up therapy are one component of a multi-disciplinary discharge planning process, led by the attending physician.  Recommendations may be updated based on patient status, additional functional criteria and insurance authorization.  Follow Up Recommendations  Home health PT     Assistance Recommended at Discharge Intermittent Supervision/Assistance  Patient can return home with the following A little help with walking and/or transfers;A little help with bathing/dressing/bathroom;Assist for transportation;Help with stairs or ramp for entrance   Equipment Recommendations   (Pt has received RW for d/c)    Recommendations for Other Services       Precautions / Restrictions Precautions Precautions: Fall Restrictions Weight Bearing Restrictions: No LLE Weight Bearing: Weight bearing as tolerated     Mobility  Bed Mobility Overal bed mobility: Needs Assistance Bed Mobility: Sit to Supine, Supine to Sit     Supine to sit: Modified independent (Device/Increase time) Sit to supine: Modified independent (Device/Increase time)         Transfers Overall transfer level: Modified independent Equipment used: None Transfers: Sit to/from Stand Sit to Stand: Min guard, Supervision Stand pivot transfers: Supervision, Min guard         General transfer comment: good safety awareness demonstrated, able tansfer bed<>BSC with supervision    Ambulation/Gait Ambulation/Gait assistance: Supervision, Min guard Gait Distance (Feet): 200 Feet Assistive device: Rolling walker (2 wheels) Gait Pattern/deviations: Step-through pattern, Trunk flexed Gait velocity: decreased     General Gait Details:  (No SOB upon exertion, no LOB)   Stairs             Wheelchair Mobility    Modified Rankin (Stroke Patients Only)       Balance Overall balance assessment: Needs assistance Sitting-balance support: Feet supported, No upper extremity supported Sitting balance-Leahy Scale: Normal     Standing balance support: No upper extremity supported, During functional activity Standing balance-Leahy Scale: Good                              Cognition Arousal/Alertness: Awake/alert Behavior During Therapy: WFL for tasks assessed/performed Overall Cognitive Status: Impaired/Different from baseline Area of Impairment: Safety/judgement                         Safety/Judgement: Decreased awareness of safety, Decreased awareness of deficits     General Comments: patient is able to follow all commands without difficulty        Exercises      General Comments General comments (skin integrity, edema, etc.): HR remained steady in mid 80's upon exertion      Pertinent  Vitals/Pain Pain Assessment Pain Assessment: No/denies pain    Home Living                          Prior Function            PT Goals (current goals can now be found in the care plan section) Acute Rehab PT Goals Patient Stated Goal: to go home Progress towards PT goals: Progressing toward goals     Frequency    7X/week      PT Plan Current plan remains appropriate    Co-evaluation              AM-PAC PT "6 Clicks" Mobility   Outcome Measure  Help needed turning from your back to your side while in a flat bed without using bedrails?: A Little Help needed moving from lying on your back to sitting on the side of a flat bed without using bedrails?: A Little Help needed moving to and from a bed to a chair (including a wheelchair)?: A Little Help needed standing up from a chair using your arms (e.g., wheelchair or bedside chair)?: A Little Help needed to walk in hospital room?: A Little Help needed climbing 3-5 steps with a railing? : A Little 6 Click Score: 18    End of Session Equipment Utilized During Treatment: Gait belt Activity Tolerance: Patient tolerated treatment well Patient left: in bed;with call bell/phone within reach;with bed alarm set;with family/visitor present Nurse Communication: Mobility status PT Visit Diagnosis: Muscle weakness (generalized) (M62.81);Other abnormalities of gait and mobility (R26.89);Unsteadiness on feet (R26.81);History of falling (Z91.81)     Time: 5643-3295 PT Time Calculation (min) (ACUTE ONLY): 32 min  Charges:  $Gait Training: 8-22 mins $Therapeutic Activity: 8-22 mins                    Mikel Cella, PTA    Josie Dixon 04/18/2021, 1:21 PM

## 2021-04-18 NOTE — Plan of Care (Signed)
Attempted to see patient but she was out for procedure. Will try to see her later.   Electronically signed by:  Lynnae Sandhoff, MD Page: [phone] 04/18/2021, 12:29 PM

## 2021-04-18 NOTE — Progress Notes (Signed)
OT Cancellation Note  Patient Details Name: Chelsea Hart MRN: [id number] DOB: 06-24-1933   Cancelled Treatment:    Reason Eval/Treat Not Completed: Patient at procedure or test/ unavailable. Pt noted to be off the floor for procedure, unavailable at this time. Will continue to follow POC at later date/time as pt available.   Dessie Coma, M.S. OTR/L  04/18/21, 12:40 PM  ascom [phone]

## 2021-04-18 NOTE — Discharge Instructions (Addendum)
Aspirin and Plavix 75 mg po daily for 21 days, then aspirin alone after that  Switched zocor to lipitor  Recommend  getting mri with and without contrast of kidney as outpatient to evaluate lesion on left kidney

## 2021-04-18 NOTE — TOC Progression Note (Addendum)
Transition of Care [facility]) - Progression Note    Patient Details  Name: Chelsea Hart MRN: [id number] Date of Birth: [birth year]  Transition of [facility]) CM/SW [place], LCSW Phone Number: 04/18/2021, 12:11 PM  Clinical Narrative:    Notified Jason with [facility] that patient might DC later today after TEE. Per Corene Cornea patient has to have a PCP appointment scheduled.  Spoke with daughter Chelsea Hart regarding setting up a PCP. PCP resources provided by [facility] yesterday. She is agreeable to [facility] referral.  Home address is [address], [place], [place].  Referral made to [facility] with [facility] who said they can see patient on 2/27 and will call daughter to schedule appointment per her request.  1:35- Call from daughter with questions about [facility], says she may go with a PCP Office in [place]. Provided contact info for [facility] Representative. Informed her that it is totally up to patient and family on where they want her to go, they will just need to find her somewhere with an appointment as soon as possible.  3:10- Spoke with daughter who stated they do want to go with [facility], have scheduled a visit for next week.   3:25- Call from daughter Chelsea Hart who stated the Attending got patient an appointment with Dr. Tressia Miners for PCP services. She has already called [facility] to cancel.   4:20Corene Cornea with [facility] asked if Dr. Eugenia Mcalpine can cover [facility] orders until [place] appointment. Dr. Eugenia Mcalpine says yes.  Expected Discharge Plan: [place] Barriers to Discharge: Continued Medical Work up  Expected Discharge Plan and Services Expected Discharge Plan: [place]   Discharge Planning Services: CM Consult Post Acute Care Choice: [place] arrangements for the past 2 months: Single Family Home                           HH Arranged: PT, OT [place] Agency: [place]  ([place]) Date [place]: 04/17/21 Time HH Agency Contacted: 1300 Representative spoke with at [place]: [place] ([place]) Interventions    Readmission Risk Interventions Readmission Risk Prevention Plan 04/17/2021  Post Dischage Appt Complete  Medication Screening Complete  Transportation Screening Complete  Some recent data might be hidden

## 2021-04-18 NOTE — CV Procedure (Signed)
Transesophageal echocardiogram preliminary report  Chelsea Hart [id number] 1933/05/26  Preliminary diagnosis  Stroke with possible embolic source  Postprocedural diagnosis  Normal LV systolic function no apparent source of embolus  Time out A timeout was performed by the nursing staff and physicians specifically identifying the procedure performed, identification of the patient, the type of sedation, all allergies and medications, all pertinent medical history, and presedation assessment of nasopharynx. The patient and or family understand the risks of the procedure including the rare risks of death, stroke, heart attack, esophogeal perforation, sore throat, and reaction to medications given.  Moderate sedation During this procedure the patient has received Versed 2 milligrams and fentanyl 50  micrograms to achieve appropriate moderate sedation.  The patient had continued monitoring of heart rate, oxygenation, blood pressure, respiratory rate, and extent of signs of sedation throughout the entire procedure.  The patient received this moderate sedation over a period of 14 minutes.  Both the nursing staff and I were present during the procedure when the patient had moderate sedation for 100% of the time.  Treatment considerations  No further cardiac intervention due to no apparent cardiac source of embolus  For further details of transesophageal echocardiogram please refer to final report.  Signed,  Corey Skains M.D. [facility] 04/18/2021 3:05 PM

## 2021-04-18 NOTE — Progress Notes (Signed)
Neurology Progress Note Lynnsey Barbara MR# [id number] 04/18/2021  S: no overnight events; no new complaints.  O: Current vital signs: BP (!) 148/74 (BP Location: Right Arm)    Pulse 66    Temp 98.5 F (36.9 C)    Resp (!) 22    Ht 5\' 3"  (1.6 m)    Wt 70.3 kg    SpO2 99%    BMI 27.46 kg/m  Vital signs in last 24 hours: Temp:  [97.9 F (36.6 C)-98.9 F (37.2 C)] 98.5 F (36.9 C) (02/17 0349) Pulse Rate:  [53-74] 66 (02/17 0349) Resp:  [16-22] 22 (02/17 0349) BP: (112-148)/(55-84) 148/74 (02/17 0349) SpO2:  [96 %-99 %] 99 % (02/17 0349)  Unable to see patient as she was undergoing TEE  Labs Lab Results  Component Value Date   HGBA1C 5.4 04/17/2021   and  Lab Results  Component Value Date   HGBA1C 5.4 04/17/2021   and  Lab Results  Component Value Date   LDLCALC 52 04/17/2021   Imaging  CT abdomen and pelvis read as no evidence of metastatic disease in the abdomen or pelvis. Borderline mild right colonic wall thickening and slight haziness of the pericolonic fat, cannot exclude a minimal infectious or inflammatory colitis.Indeterminate hypodense 1.9 cm inferior left renal cortical lesion, new from 2007 CT. Dedicated outpatient MRI (preferred) or CT abdomen without and with IV contrast is indicated for further characterization and may be obtained as clinically warranted. Minimal left colonic diverticulosis.  CT chest PE protocol read as interval decrease in number of filling defects in subsegmental pulmonary artery branches in the right lower lobe. There are residual small filling defects in segmental/subsegmental branches in the right lung. There are no new foci of pulmonary embolism. There is no evidence of right ventricular strain.  TTE read as Left ventricular ejection fraction, by estimation, is 60 to 65%. The left has no regional wall motion abnormalities, no shunt or mention of LVT.   Assessment: Chelsea Hart is a 88 y.o. female PMHx with aphasia which resolved.  She is  on full dose LMWH and symptoms resolved and not a candidate for thrombolysis. CTA head showed left M3 occlusion and she was not a candidate for intervention.    Recent bilateral PE 03/2021 and was on LMWH with the last dose administered the morning of presentation and left ventricular thrombus remains in the differential. She also has a remote history of uterine cancer (s/p hysterectomy in remission) and is a smoker and therefore will need to consider paraneoplastic hypercoagulable state. Thus far, CT chest/abdomen/pelvis did not show mass lesion. TEE did not show embolic source and hypercoagulable and paraneoplastic panels were ordered which should result by the time she follows up with outpatient neurology.  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 Left M3 occlusion. Recent bilateral pulmonary emboli 04/01/2021 was on LMWH. HTN HLD Right V4 segment/PICA unruptured aneurysm - Neurosurgery follow up scheduled. Smoker.     Plan: - Hypercoagulable and paraneoplastic panels ordered should result by the time she follows up with outpatient neurology. - Continue statin for goal LDL <70. - Continue aspirin 81mg  daily. - Start clopidogrel 75mg  daily. - BP goal <140/90. - Follow up with outpatient neurology. - Neurology will remain available, please call for questions.  Electronically signed by:  Lynnae Sandhoff, MD Page: [phone] 04/18/2021, 8:21 AM  If 7pm- 7am, please page neurology on call as listed in [place].

## 2021-04-18 NOTE — Assessment & Plan Note (Signed)
Can go back on Toprol XL 25 mg daily as outpatient

## 2021-04-18 NOTE — Discharge Summary (Signed)
Physician Discharge Summary   Patient: Chelsea Hart MRN: [id number] DOB: 12-16-1933  Admit date:     04/16/2021  Discharge date: 04/18/21  Discharge Physician: Loletha Grayer   PCP: Pcp, No   Recommendations at discharge:   Follow-up new PCP in a couple weeks Follow-up cardiology 1 week Follow-up neurology 2 weeks  Discharge Diagnoses: Principal Problem:   Acute CVA (cerebrovascular accident) [facility]) Active Problems:   Brain aneurysm   Pulmonary embolism ([place])   Lesion of left native kidney   CKD (chronic kidney disease) stage 3, GFR 30-59 ml/min (HCC)   Essential hypertension   Depression   CVA (cerebral vascular accident) [facility])    Hospital Course: The patient was brought in as an observation on 04/16/2020 and found to have an acute stroke.  She initially had slurred speech drooling and a facial droop.  The symptoms have resolved  The patient did well enough with physical therapy for them to recommend home with home health.  Patient was seen by neurology and they initially recommended aspirin initially because the MRI showed a probable small acute infarct in the left insular cortex with mild associated hemorrhage.  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  Upon discharge the patient will be on Plavix for 21 days and aspirin indefinitely.  TEE did not show any signs of embolic phenomenon.  Neurology sent off a hypercoagulable work-up and hyper neoplastic work-up and outpatient neurology will follow-up on this.  Assessment and Plan: * Acute CVA (cerebrovascular accident) ([place])- (present on admission) Small acute infarct in the left insular cortex with associated mild hemorrhage.  TEE negative for source of emboli.  CT angio of the head and neck did not show any source of emboli.  Patient on aspirin and Lipitor.  LDL 52.  Upon discharge neurology recommended 21 days of Plavix.  Follow-up with cardiology for event  monitor.  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  Pulmonary embolism ([place])- (present on admission) CT scan again showing pulmonary emboli but no new emboli from last exam.  Decision last admission was not to anticoagulate because these were asymptomatic and they did not want the increased risk of bleeding with the cerebral aneurysm.  Lesion of left native kidney 1.9 cm inferior left renal cortical lesion.  (New from 2017 CT scan) will recommend an MRI as outpatient for further characterization.  CKD (chronic kidney disease) stage 3, GFR 30-59 ml/min (HCC)- (present on admission) CKD stage IIIa.  Last creatinine 0.95 with a GFR 59  Depression Continue Paxil  Closed left hip fracture ([place]) Completed rehab for recent left hip fracture.  Physical therapy recommending home with home health.  Essential hypertension- (present on admission) Can go back on Toprol XL 25 mg daily as outpatient  Suspected thyroid nodules seen on CT scan of the neck.  Consider outpatient thyroid ultrasound and thyroid function testing.         Consultants: Neurology, cardiology Procedures performed: TEE Disposition: Home health Diet recommendation:  Cardiac diet  DISCHARGE MEDICATION: Allergies as of 04/18/2021       Reactions   Penicillins         Medication List     STOP taking these medications    diphenhydrAMINE 25 MG tablet Commonly known as: BENADRYL   enoxaparin 40 MG/0.4ML injection Commonly known as: LOVENOX   HYDROcodone-acetaminophen 5-325 MG tablet Commonly  known as: NORCO/VICODIN   loratadine 10 MG tablet Commonly known as: CLARITIN   metoprolol tartrate 25 MG tablet Commonly known as: LOPRESSOR   simvastatin 20 MG tablet Commonly known as: ZOCOR       TAKE these medications    ALPRAZolam 0.5 MG tablet Commonly known as: XANAX Take 0.5 tablets (0.25 mg total) by mouth at bedtime as  needed for sleep.   ascorbic acid 500 MG tablet Commonly known as: VITAMIN C Take 500 mg by mouth daily.   aspirin 81 MG EC tablet Take 81 mg by mouth daily.   atorvastatin 40 MG tablet Commonly known as: LIPITOR Take 1 tablet (40 mg total) by mouth daily. Start taking on: April 19, 2021   calcium carbonate 1500 (600 Ca) MG Tabs tablet Commonly known as: OSCAL Take 1 tablet by mouth daily.   Cholecalciferol 50 MCG (2000 UT) Tabs Take 2,000 Units by mouth daily.   clopidogrel 75 MG tablet Commonly known as: Plavix Take 1 tablet (75 mg total) by mouth daily for 21 days.   feeding supplement Liqd Take 237 mLs by mouth 2 (two) times daily between meals.   fenofibrate 54 MG tablet Take 54 mg by mouth daily.   latanoprost 0.005 % ophthalmic solution Commonly known as: XALATAN Place 1 drop into both eyes at bedtime.   melatonin 3 MG Tabs tablet Take 3 mg by mouth at bedtime.   metoprolol succinate 25 MG 24 hr tablet Commonly known as: Toprol XL Take 1 tablet (25 mg total) by mouth daily.   PARoxetine 40 MG tablet Commonly known as: PAXIL Take 40 mg by mouth daily.   Polyethyl Glyc-Propyl Glyc PF 0.4-0.3 % Soln Apply 1-2 drops to eye 2 (two) times daily as needed.   polyethylene glycol 17 g packet Commonly known as: MIRALAX / GLYCOLAX Take 17 g by mouth daily.   sennosides-docusate sodium 8.6-50 MG tablet Commonly known as: SENOKOT-S Take 1 tablet by mouth daily.        Follow-up Information     Corey Skains, MD Follow up in 1 week(s).   Specialty: Cardiology Contact information: [facility] [place] [address] [phone]         [facility] Follow up in 2 week(s).   Contact information: [address]     [address] [place] [address] [phone]        Gladstone Lighter, MD Follow up in 2 week(s).   Specialty: Internal Medicine Why: new PCP needed  after hospital Contact information: [place] [address] [phone]                 Discharge Exam: Danley Danker Weights   04/16/21 0953  Weight: 70.3 kg   Physical Exam HENT:     Head: Normocephalic.     Mouth/Throat:     Pharynx: No oropharyngeal exudate.  Eyes:     General: Lids are normal.     Conjunctiva/sclera: Conjunctivae normal.  Cardiovascular:     Rate and Rhythm: Normal rate and regular rhythm.     Heart sounds: Normal heart sounds, S1 normal and S2 normal.  Pulmonary:     Breath sounds: No decreased breath sounds, wheezing, rhonchi or rales.  Abdominal:     Palpations: Abdomen is soft.     Tenderness: There is no abdominal tenderness.  Musculoskeletal:     Right lower leg: No swelling.     Left lower leg: No swelling.  Skin:    General: Skin is warm.     Findings: No rash.  Neurological:     Mental Status: She is alert and oriented to person, place, and time.     Condition at discharge: stable  The results of significant diagnostics from this hospitalization (including imaging, microbiology, ancillary and laboratory) are listed below for reference.   Imaging Studies: CT ANGIO HEAD NECK W WO CM  Addendum Date: 03/31/2021   ADDENDUM REPORT: 03/31/2021 13:14 ADDENDUM: Thyroid nodules measuring up to 1.5 cm. Recommend thyroid US if clinically warranted given patient age. Reference: J Am Coll Radiol. 2015 Feb;12(2): 143-50 Electronically Signed   By: Logan Bores M.D.   On: 03/31/2021 13:14   Result Date: 03/31/2021 CLINICAL DATA:  Neuro deficit, acute, stroke suspected + aneurysm L V4 segment?. Suspected left V4 segment aneurysm on MRI. EXAM: CT ANGIOGRAPHY HEAD AND NECK TECHNIQUE: Multidetector CT imaging of the head and neck was performed using the standard protocol during bolus administration of intravenous contrast. Multiplanar CT image reconstructions and MIPs were obtained to evaluate the vascular anatomy. Carotid stenosis  measurements (when applicable) are obtained utilizing NASCET criteria, using the distal internal carotid diameter as the denominator. RADIATION DOSE REDUCTION: This exam was performed according to the departmental dose-optimization program which includes automated exposure control, adjustment of the mA and/or kV according to patient size and/or use of iterative reconstruction technique. CONTRAST:  68mL OMNIPAQUE IOHEXOL 350 MG/ML SOLN COMPARISON:  Head MRI 03/30/2021 FINDINGS: CT HEAD FINDINGS Brain: There is no evidence of an acute infarct, intracranial hemorrhage, mass, midline shift, or extra-axial fluid collection. Patchy to confluent hypodensities in the cerebral white matter bilaterally are unchanged and nonspecific but compatible with moderately extensive chronic small vessel ischemic disease. Chronic lacunar infarcts are again noted in the basal ganglia and cerebellum, and a small chronic cortical and subcortical infarct is again noted in the right frontal operculum and insula. There is mild cerebral atrophy. Vascular: Calcified atherosclerosis at the skull base. Skull: No fracture or suspicious osseous lesion. Sinuses: Paranasal sinuses and mastoid air cells are clear. Orbits: Bilateral cataract extraction. Review of the MIP images confirms the above findings CTA NECK FINDINGS Aortic arch: Standard 3 vessel aortic arch with moderate atherosclerotic plaque. Patent brachiocephalic and subclavian arteries without evidence of significant stenosis. Right carotid system: Patent with a small to moderate amount of predominantly calcified plaque at the carotid bifurcation. No evidence of a significant stenosis or dissection. Left carotid system: Patent with a small amount of predominantly calcified plaque at the carotid bifurcation. No evidence of a significant stenosis or dissection. Retropharyngeal course of the distal common carotid artery. Vertebral arteries: Patent and codominant without evidence significant  stenosis or dissection. Skeleton: Advanced cervical disc and facet degeneration. Other neck: Bilateral thyroid nodules measuring up to 1.5 cm. No evidence of cervical lymphadenopathy. Upper chest: Mild interlobular septal thickening and mild ground-glass opacity in the lung apices. Small volume distal segmental and subsegmental pulmonary arterial emboli in the right upper lobe. Review of the MIP images confirms the above findings CTA HEAD FINDINGS Anterior circulation: The internal carotid arteries are patent from skull base to carotid termini with mild atherosclerotic plaque bilaterally not resulting in significant stenosis. ACAs and MCAs are patent without evidence of a proximal branch occlusion or significant proximal stenosis. No aneurysm is identified. Posterior circulation: The intracranial vertebral arteries are widely patent to the basilar. A left PICA aneurysm measures 8 x 7 mm. The basilar artery is widely patent. Posterior communicating arteries  are diminutive or absent. Both PCAs are patent with mild atherosclerotic irregularity but no evidence of a flow limiting proximal stenosis. Venous sinuses: More fully evaluated on the separate dedicated CT venogram. Anatomic variants: None. Review of the MIP images confirms the above findings IMPRESSION: 1. Distal segmental and subsegmental pulmonary arterial emboli in the right upper lobe. 2. 8 mm left PICA aneurysm. 3. Mild atherosclerosis in the head and neck without large vessel occlusion or significant proximal stenosis. 4. Suspected mild pulmonary edema. 5. Aortic Atherosclerosis (ICD10-I70.0). Critical Value/emergent results were called by telephone at the time of interpretation on 03/31/2021 at 10:10 am to Dr. Su Monks, who verbally acknowledged these results. Electronically Signed: By: Logan Bores M.D. On: 03/31/2021 10:11   DG Chest 1 View  Result Date: 03/27/2021 CLINICAL DATA:  Weakness and fall.  Question infiltrate. EXAM: CHEST  1 VIEW  COMPARISON:  04/14/2005 FINDINGS: Artifact overlies the chest. Heart size is normal. Mild aortic atherosclerotic tortuosity and calcification. The right chest is clear and normal. There is mild elevation of the left hemidiaphragm, with mild crowding of the markings at the lung base because of that, but the lung appears otherwise clear. No effusion. IMPRESSION: No acute or traumatic finding. Mild elevation of the left hemidiaphragm with mild crowding of the markings at the left base because of that. Electronically Signed   By: Nelson Chimes M.D.   On: 03/27/2021 16:13   CT HEAD WO CONTRAST (5MM)  Result Date: 03/29/2021 CLINICAL DATA:  Neuro deficit with acute stroke suspected EXAM: CT HEAD WITHOUT CONTRAST TECHNIQUE: Contiguous axial images were obtained from the base of the skull through the vertex without intravenous contrast. RADIATION DOSE REDUCTION: This exam was performed according to the departmental dose-optimization program which includes automated exposure control, adjustment of the mA and/or kV according to patient size and/or use of iterative reconstruction technique. COMPARISON:  Head CT 03/27/2021 FINDINGS: Brain: No evidence of acute infarction, hemorrhage, hydrocephalus, extra-axial collection or mass lesion/mass effect. Chronic small vessel ischemic gliosis. Chronic small-vessel infarcts at the right frontal operculum/upper insula and right cerebellum. Chronic lacunes at the deep gray nuclei. Vascular: No hyperdense vessel or unexpected calcification. Skull: Normal. Negative for fracture or focal lesion. Sinuses/Orbits: No acute finding. IMPRESSION: 1. No acute finding. 2. Extensive chronic ischemic injury. Electronically Signed   By: Jorje Guild M.D.   On: 03/29/2021 11:48   CT HEAD WO CONTRAST (5MM)  Result Date: 03/27/2021 CLINICAL DATA:  Head trauma, minor (Age >= 65y) EXAM: CT HEAD WITHOUT CONTRAST TECHNIQUE: Contiguous axial images were obtained from the base of the skull through  the vertex without intravenous contrast. RADIATION DOSE REDUCTION: This exam was performed according to the departmental dose-optimization program which includes automated exposure control, adjustment of the mA and/or kV according to patient size and/or use of iterative reconstruction technique. COMPARISON:  None. FINDINGS: Brain: No evidence of acute infarction, hemorrhage, hydrocephalus, extra-axial collection or mass lesion/mass effect. Focal area of encephalomalacia in the right frontal lobe compatible with remote ischemia. Multiple prior lacunar infarcts in the bilateral basal ganglia. Extensive low-density changes within the periventricular and subcortical white matter compatible with chronic microvascular ischemic change. Mild diffuse cerebral volume loss. Vascular: Atherosclerotic calcifications involving the large vessels of the skull base. No unexpected hyperdense vessel. Skull: Normal. Negative for fracture or focal lesion. Sinuses/Orbits: No acute finding. Other: None. IMPRESSION: 1. No acute intracranial abnormality. 2. Extensive chronic microvascular ischemic changes and cerebral volume loss. Electronically Signed   By: Davina Poke D.O.  On: 03/27/2021 16:40   CT Angio Chest Pulmonary Embolism (PE) W or WO Contrast  Result Date: 04/17/2021 CLINICAL DATA:  Pulmonary embolism EXAM: CT ANGIOGRAPHY CHEST WITH CONTRAST TECHNIQUE: Multidetector CT imaging of the chest was performed using the standard protocol during bolus administration of intravenous contrast. Multiplanar CT image reconstructions and MIPs were obtained to evaluate the vascular anatomy. RADIATION DOSE REDUCTION: This exam was performed according to the departmental dose-optimization program which includes automated exposure control, adjustment of the mA and/or kV according to patient size and/or use of iterative reconstruction technique. CONTRAST:  26mL OMNIPAQUE IOHEXOL 350 MG/ML SOLN COMPARISON:  04/01/2021 FINDINGS:  Cardiovascular: There is homogeneous enhancement in the thoracic aorta. There is mild ectasia of ascending thoracic aorta. There are scattered coronary artery calcifications. There are small filling defects in the subsegmental branches in the right upper lobe and right lower lobe. There is interval decrease in number of filling defects in the right lower lobe. There are no new intraluminal filling defects in the pulmonary artery branches. There are no signs of right ventricular strain. Mediastinum/Nodes: No significant lymphadenopathy seen in mediastinum. Thyroid is enlarged with inhomogeneous attenuation. Lungs/Pleura: There are no new focal pulmonary infiltrates. There is no pleural effusion or pneumothorax. Upper Abdomen: There is 4.1 cm fluid density structure in the left lobe of liver, possibly a cyst. Musculoskeletal: Unremarkable. Review of the MIP images confirms the above findings. IMPRESSION: There is interval decrease in number of filling defects in subsegmental pulmonary artery branches in the right lower lobe. There are residual small filling defects in segmental/subsegmental branches in the right lung. There are no new foci of pulmonary embolism. There is no evidence of right ventricular strain. There are no new focal pulmonary infiltrates. There is no pleural effusion. Coronary artery calcifications are seen. Other findings as described in the body of the report. These results will be called to the ordering clinician or representative by the Radiologist Assistant, and communication documented in the PACS or [employer]. Electronically Signed   By: Elmer Picker M.D.   On: 04/17/2021 12:04   CT Angio Chest Pulmonary Embolism (PE) W or WO Contrast  Result Date: 04/01/2021 CLINICAL DATA:  A female at age 78 presents with shortness of breath and concern for pulmonary embolism. EXAM: CT ANGIOGRAPHY CHEST WITH CONTRAST TECHNIQUE: Multidetector CT imaging of the chest was performed using the  standard protocol during bolus administration of intravenous contrast. Multiplanar CT image reconstructions and MIPs were obtained to evaluate the vascular anatomy. RADIATION DOSE REDUCTION: This exam was performed according to the departmental dose-optimization program which includes automated exposure control, adjustment of the mA and/or kV according to patient size and/or use of iterative reconstruction technique. CONTRAST:  23mL OMNIPAQUE IOHEXOL 350 MG/ML SOLN COMPARISON:  None FINDINGS: Cardiovascular: Cardiomegaly. Signs of coronary artery calcification of LEFT and RIGHT coronary circulation. No substantial pericardial effusion. Calcified and noncalcified atheromatous plaque of the thoracic aorta. Aorta is nonaneurysmal at 3.7 cm of the ascending thoracic aorta. Normal caliber of the descending thoracic aorta. Main pulmonary artery is well opacified as are peripheral branches, density in the main pulmonary artery 514 Hounsfield units. Bilateral subsegmental in segmental level emboli to the lower lobes. (Image 147/5) RIGHT lower lobe branch involvement at the segmental level. (Image 172/5) LEFT lower lobe subsegmental level involvement. No main pulmonary arterial or lobar level emboli. RIGHT upper lobe segmental pulmonary arterial embolus (image 97/5) Mediastinum/Nodes: No thoracic inlet, axillary, mediastinal or hilar adenopathy. Esophagus grossly normal. Lungs/Pleura: Basilar atelectasis and small  pleural effusions. No lobar consolidation. Scarring along the RIGHT heart border. Airways are patent. Upper Abdomen: Incidental imaging of upper abdominal contents with incomplete imaging of a moderately large hepatic cyst, purely cystic with respect to visualized portions. Adrenal glands are normal. Musculoskeletal: No acute musculoskeletal process. Spinal degenerative changes. Review of the MIP images confirms the above findings. IMPRESSION: 1. Positive for RIGHT-sided segmental and subsegmental pulmonary emboli  and LEFT lower lobe subsegmental pulmonary embolism. 2. Small bilateral pleural effusions with basilar atelectasis. 3. Cardiomegaly with coronary artery calcification of LEFT and RIGHT coronary circulation. 4. Aortic atherosclerosis. Aortic Atherosclerosis (ICD10-I70.0). Critical Value/emergent results were called by telephone at the time of interpretation on 04/01/2021 at 12:51 pm to provider [facility] , who verbally acknowledged these results. Electronically Signed   By: Zetta Bills M.D.   On: 04/01/2021 12:52   CT Cervical Spine Wo Contrast  Result Date: 03/27/2021 CLINICAL DATA:  Neck trauma EXAM: CT CERVICAL SPINE WITHOUT CONTRAST TECHNIQUE: Multidetector CT imaging of the cervical spine was performed without intravenous contrast. Multiplanar CT image reconstructions were also generated. RADIATION DOSE REDUCTION: This exam was performed according to the departmental dose-optimization program which includes automated exposure control, adjustment of the mA and/or kV according to patient size and/or use of iterative reconstruction technique. COMPARISON:  None. FINDINGS: Alignment: Grade 1 anterolisthesis of C3 on C4 and C7 on T1. No evidence of acute subluxation. Skull base and vertebrae: No acute fracture. No primary bone lesion or focal pathologic process. Soft tissues and spinal canal: No prevertebral fluid or swelling. No visible canal hematoma. Disc levels: Moderate to severe intervertebral disc space narrowing from C4 through C7. Associated endplate sclerosis and dorsal endplate osteophytes most prominent at C5-C6. Bilateral facet arthropathy. Mild neural foraminal narrowing at C5-C6 on the right. Upper chest: Heterogeneous thyroid gland with likely nodules on the right measuring up to 1.5 cm. Mild biapical pleural thickening. Other: None. IMPRESSION: 1. No acute fracture or subluxation identified. Degenerative changes of the cervical spine. 2. Heterogeneous nodular thyroid gland, consider follow-up  ultrasound. Electronically Signed   By: Ofilia Neas M.D.   On: 03/27/2021 16:37   MR BRAIN WO CONTRAST  Addendum Date: 04/16/2021   ADDENDUM REPORT: 04/16/2021 13:28 ADDENDUM: These results were called by telephone at the time of interpretation on 04/16/2021 at 1:27 pm to provider TOCHUKWU AGBATA , who verbally acknowledged these results. Electronically Signed   By: Franchot Gallo M.D.   On: 04/16/2021 13:28   Result Date: 04/16/2021 CLINICAL DATA:  Acute neuro deficit EXAM: MRI HEAD WITHOUT CONTRAST TECHNIQUE: Multiplanar, multiecho pulse sequences of the brain and surrounding structures were obtained without intravenous contrast. COMPARISON:  CT angio head and neck 04/16/2021.  MRI head 03/20/2021 FINDINGS: Brain: Small diffusion hyperintensity in the left insula. There is associated susceptibility in this area. These findings were not present on the recent MRI of 03/30/2021 therefore likely represent small acute hemorrhagic infarct. Mild atrophy. Moderate chronic microvascular ischemic change in the white matter, basal ganglia, and pons. Small chronic infarcts in the cerebellum bilaterally. Chronic microhemorrhage also in the right frontal lobe Vascular: Normal arterial flow voids at the skull base. 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 Skull and upper cervical spine: No focal skeletal lesion. Sinuses/Orbits: Paranasal sinuses clear. Bilateral cataract extraction Other: None IMPRESSION: Probable small acute infarct in the left insular cortex with associated mild hemorrhage. 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 Electronically Signed: By: Franchot Gallo M.D. On: 04/16/2021 12:48   MR BRAIN WO CONTRAST  Result Date: 03/30/2021 CLINICAL DATA:  Right facial droop following hip surgery EXAM: MRI HEAD WITHOUT CONTRAST TECHNIQUE: Multiplanar, multiecho pulse  sequences of the brain and surrounding structures were obtained without intravenous contrast. COMPARISON:  Noncontrast CT head dated 1 day prior FINDINGS: Brain: There is no evidence of acute intracranial hemorrhage, extra-axial fluid collection, or acute infarct. There is mild global parenchymal volume loss with prominence of the ventricular system and extra-axial CSF spaces. There are multiple remote infarcts in the bilateral basal ganglia, bilateral cerebellar hemispheres, and right frontal lobe. There is extensive patchy FLAIR signal abnormality in the subcortical and periventricular white matter likely reflecting sequela of moderate chronic white matter microangiopathy. There is no mass lesion.  There is no midline shift. Vascular: The major arterial flow voids are present. There is a suspected 6 mm aneurysm arising from the left V4 segment (10-6). There is abnormal T2 hyperintensity in the left sigmoid sinus. Skull and upper cervical spine: Normal marrow signal. Sinuses/Orbits: The paranasal sinuses are clear. Bilateral lens implants are in place. The globes and orbits are otherwise unremarkable. Other: None. IMPRESSION: 1. Abnormal T2 signal in the left sigmoid sinus could reflect slow flow; however, this could also reflect venous sinus thrombosis. 2. Suspected aneurysm arising from the left V4 segment. Recommend CTA and CTV for further evaluation of both of these findings. 3. Otherwise, no acute intracranial pathology. 4. Multiple remote infarcts on a background of moderate chronic white matter microangiopathy as above. Electronically Signed   By: Valetta Mole M.D.   On: 03/30/2021 18:21   CT ABDOMEN PELVIS W CONTRAST  Result Date: 04/17/2021 CLINICAL DATA:  Inpatient. History of ovarian cancer. History of recent pulmonary embolism and acute CVA. Restaging. EXAM: CT ABDOMEN AND PELVIS WITH CONTRAST TECHNIQUE: Multidetector CT imaging of the abdomen and pelvis was performed using the standard protocol  following bolus administration of intravenous contrast. RADIATION DOSE REDUCTION: This exam was performed according to the departmental dose-optimization program which includes automated exposure control, adjustment of the mA and/or kV according to patient size and/or use of iterative reconstruction technique. CONTRAST:  73mL OMNIPAQUE IOHEXOL 350 MG/ML SOLN COMPARISON:  04/15/2005 CT abdomen/pelvis. FINDINGS: Lower chest: No significant pulmonary nodules or acute consolidative airspace disease. Coronary atherosclerosis. Hepatobiliary: Normal liver size. Simple 3.9 cm upper left liver cyst. No additional liver lesions. Cholecystectomy. No biliary ductal dilatation. Multiple small to moderate periampullary duodenal diverticula. Pancreas: Normal, with no mass or duct dilation. Spleen: Normal size. No mass. Adrenals/Urinary Tract: Normal adrenals. Hypodense 1.9 cm inferior left renal cortical lesion, new from 2007 CT. Simple 1.7 cm interpolar left renal cortical cyst. Subcentimeter hypodense upper left renal cortical lesion, too small to characterize. No hydronephrosis. Normal nondistended bladder. Stomach/Bowel: Normal non-distended stomach. Normal caliber small bowel with no small bowel wall thickening. Apparent appendectomy. Moderate rectal stool. Minimal left colonic diverticulosis. Borderline mild wall thickening with slight haziness of the pericolonic fat in the cecum and ascending colon. Vascular/Lymphatic: Atherosclerotic nonaneurysmal abdominal aorta. Patent portal, splenic, hepatic and renal veins. No pathologically enlarged lymph nodes in the abdomen or pelvis. Reproductive: Status post hysterectomy, with no abnormal findings at the vaginal cuff. No adnexal mass. Other: No pneumoperitoneum, ascites or focal fluid collection. No discrete peritoneal nodularity. Musculoskeletal: No aggressive appearing focal osseous lesions. Moderate thoracolumbar spondylosis. Intact left femoral neck pins. IMPRESSION: 1. No  evidence of metastatic disease in the  abdomen or pelvis. 2. Borderline mild right colonic wall thickening and slight haziness of the pericolonic fat, cannot exclude a minimal infectious or inflammatory colitis. 3. Indeterminate hypodense 1.9 cm inferior left renal cortical lesion, new from 2007 CT. Dedicated outpatient MRI (preferred) or CT abdomen without and with IV contrast is indicated for further characterization and may be obtained as clinically warranted. 4. Minimal left colonic diverticulosis. 5. Coronary atherosclerosis. 6. Aortic Atherosclerosis (ICD10-I70.0). Electronically Signed   By: Ilona Sorrel M.D.   On: 04/17/2021 12:37   US Venous Img Lower Bilateral (DVT)  Result Date: 03/31/2021 CLINICAL DATA:  88 year old female with history of pulmonary embolism. EXAM: BILATERAL LOWER EXTREMITY VENOUS DOPPLER ULTRASOUND TECHNIQUE: Gray-scale sonography with graded compression, as well as color Doppler and duplex ultrasound were performed to evaluate the lower extremity deep venous systems from the level of the common femoral vein and including the common femoral, femoral, profunda femoral, popliteal and calf veins including the posterior tibial, peroneal and gastrocnemius veins when visible. The superficial great saphenous vein was also interrogated. Spectral Doppler was utilized to evaluate flow at rest and with distal augmentation maneuvers in the common femoral, femoral and popliteal veins. COMPARISON:  None. FINDINGS: RIGHT LOWER EXTREMITY Common Femoral Vein: No evidence of thrombus. Normal compressibility, respiratory phasicity and response to augmentation. Saphenofemoral Junction: No evidence of thrombus. Normal compressibility and flow on color Doppler imaging. Profunda Femoral Vein: No evidence of thrombus. Normal compressibility and flow on color Doppler imaging. Femoral Vein: No evidence of thrombus. Normal compressibility, respiratory phasicity and response to augmentation. Popliteal Vein: No  evidence of thrombus. Normal compressibility, respiratory phasicity and response to augmentation. Calf Veins: No evidence of thrombus. Normal compressibility and flow on color Doppler imaging. Other Findings:  None. LEFT LOWER EXTREMITY Common Femoral Vein: No evidence of thrombus. Normal compressibility, respiratory phasicity and response to augmentation. Saphenofemoral Junction: No evidence of thrombus. Normal compressibility and flow on color Doppler imaging. Profunda Femoral Vein: No evidence of thrombus. Normal compressibility and flow on color Doppler imaging. Femoral Vein: No evidence of thrombus. Normal compressibility, respiratory phasicity and response to augmentation. Popliteal Vein: No evidence of thrombus. Normal compressibility, respiratory phasicity and response to augmentation. Calf Veins: No evidence of thrombus. Normal compressibility and flow on color Doppler imaging. Other Findings:  None. IMPRESSION: No evidence of bilateral lower extremity deep venous thrombosis. Ruthann Cancer, MD [facility] Electronically Signed   By: Ruthann Cancer M.D.   On: 03/31/2021 12:17   DG Knee Complete 4 Views Left  Result Date: 03/27/2021 CLINICAL DATA:  [place] yesterday.  Knee pain. EXAM: LEFT KNEE - COMPLETE 4+ VIEW COMPARISON:  None. FINDINGS: No joint effusion. No fracture. Chronic appearing joint space narrowing with chondrocalcinosis, typical of age. No focal lesion. IMPRESSION: No acute or traumatic finding. Joint space narrowing and chondrocalcinosis. Electronically Signed   By: Nelson Chimes M.D.   On: 03/27/2021 16:11   DG C-Arm 1-60 Min-No Report  Result Date: 03/28/2021 Fluoroscopy was utilized by the requesting physician.  No radiographic interpretation.   ECHOCARDIOGRAM COMPLETE  Result Date: 04/17/2021    ECHOCARDIOGRAM REPORT   Patient Name:   MARENE GILLIAM Date of Exam: 04/17/2021 Medical Rec #:  [id number]      Height:       63.0 in  Accession #:    [id number]     Weight:       155.0 lb Date of Birth:  April 04, 1933       BSA:  1.735 m Patient Age:    88 years       BP:           132/61 mmHg Patient Gender: F              HR:           51 bpm. Exam Location:  [facility] Procedure: 2D Echo, Color Doppler and Cardiac Doppler Indications:     Stroke I63.9  History:         Patient has no prior history of Echocardiogram examinations.                  Anxiety.  Sonographer:     Sherrie Sport Referring Phys:  [place] Diagnosing Phys: Serafina Royals MD  Sonographer Comments: Suboptimal apical window. IMPRESSIONS  1. Left ventricular ejection fraction, by estimation, is 60 to 65%. The left ventricle has normal function. The left ventricle has no regional wall motion abnormalities. Left ventricular diastolic parameters were normal.  2. Right ventricular systolic function is normal. The right ventricular size is normal.  3. The mitral valve is normal in structure. Mild mitral valve regurgitation.  4. The aortic valve is normal in structure. Aortic valve regurgitation is not visualized. FINDINGS  Left Ventricle: Left ventricular ejection fraction, by estimation, is 60 to 65%. The left ventricle has normal function. The left ventricle has no regional wall motion abnormalities. The left ventricular internal cavity size was normal in size. There is  no left ventricular hypertrophy. Left ventricular diastolic parameters were normal. Right Ventricle: The right ventricular size is normal. No increase in right ventricular wall thickness. Right ventricular systolic function is normal. Left Atrium: Left atrial size was normal in size. Right Atrium: Right atrial size was normal in size. Pericardium: There is no evidence of pericardial effusion. Mitral Valve: The mitral valve is normal in structure. Mild mitral valve regurgitation. MV peak gradient, 8.2 mmHg. The mean mitral valve gradient is 2.0 mmHg. Tricuspid Valve: The tricuspid valve is normal in  structure. Tricuspid valve regurgitation is mild. Aortic Valve: The aortic valve is normal in structure. Aortic valve regurgitation is not visualized. Aortic valve mean gradient measures 6.0 mmHg. Aortic valve peak gradient measures 9.2 mmHg. Aortic valve area, by VTI measures 4.24 cm. Pulmonic Valve: The pulmonic valve was normal in structure. Pulmonic valve regurgitation is not visualized. Aorta: The aortic root and ascending aorta are structurally normal, with no evidence of dilitation. IAS/Shunts: No atrial level shunt detected by color flow Doppler.  LEFT VENTRICLE PLAX 2D LVIDd:         5.00 cm   Diastology LVIDs:         3.40 cm   LV e' medial:    4.57 cm/s LV PW:         1.20 cm   LV E/e' medial:  21.4 LV IVS:        1.00 cm   LV e' lateral:   6.64 cm/s LVOT diam:     2.00 cm   LV E/e' lateral: 14.7 LV SV:         146 LV SV Index:   84 LVOT Area:     3.14 cm  RIGHT VENTRICLE RV Basal diam:  3.50 cm RV S prime:     15.70 cm/s TAPSE (M-mode): 2.6 cm LEFT ATRIUM              Index        RIGHT ATRIUM  Index LA diam:        3.90 cm  2.25 cm/m   RA Area:     17.30 cm LA Vol (A2C):   122.0 ml 70.31 ml/m  RA Volume:   55.10 ml  31.76 ml/m LA Vol (A4C):   73.5 ml  42.36 ml/m LA Biplane Vol: 105.0 ml 60.51 ml/m  AORTIC VALVE                     PULMONIC VALVE AV Area (Vmax):    3.58 cm      PV Vmax:        0.88 m/s AV Area (Vmean):   3.80 cm      PV Vmean:       60.000 cm/s AV Area (VTI):     4.24 cm      PV VTI:         0.196 m AV Vmax:           152.00 cm/s   PV Peak grad:   3.1 mmHg AV Vmean:          110.000 cm/s  PV Mean grad:   2.0 mmHg AV VTI:            0.344 m       RVOT Peak grad: 6 mmHg AV Peak Grad:      9.2 mmHg AV Mean Grad:      6.0 mmHg LVOT Vmax:         173.00 cm/s LVOT Vmean:        133.000 cm/s LVOT VTI:          0.464 m LVOT/AV VTI ratio: 1.35  AORTA Ao Root diam: 3.37 cm MITRAL VALVE                TRICUSPID VALVE MV Area (PHT): 2.47 cm     TR Peak grad:   24.4 mmHg MV Area  VTI:   3.49 cm     TR Vmax:        247.00 cm/s MV Peak grad:  8.2 mmHg MV Mean grad:  2.0 mmHg     SHUNTS MV Vmax:       1.43 m/s     Systemic VTI:  0.46 m MV Vmean:      70.1 cm/s    Systemic Diam: 2.00 cm MV Decel Time: 307 msec     Pulmonic VTI:  0.253 m MV E velocity: 97.70 cm/s MV A velocity: 109.00 cm/s MV E/A ratio:  0.90 Serafina Royals MD Electronically signed by Serafina Royals MD Signature Date/Time: 04/17/2021/12:19:31 PM    Final    CT VENOGRAM HEAD  Result Date: 03/31/2021 CLINICAL DATA:  Dural venous sinus thrombosis suspected. Abnormal T2 signal in the left sigmoid sinus on MRI brain. EXAM: CT VENOGRAM HEAD TECHNIQUE: Venographic phase images of the brain were obtained following the administration of intravenous contrast. Multiplanar reformats and maximum intensity projections were generated. RADIATION DOSE REDUCTION: This exam was performed according to the departmental dose-optimization program which includes automated exposure control, adjustment of the mA and/or kV according to patient size and/or use of iterative reconstruction technique. CONTRAST:  16mL OMNIPAQUE IOHEXOL 350 MG/ML SOLN COMPARISON:  Head MRI 03/30/2021 FINDINGS: The superior sagittal sinus, internal cerebral veins, vein of Galen, straight sinus, transverse sinuses, sigmoid sinuses, and jugular bulbs are patent without evidence of thrombus. The right transverse and sigmoid sinuses are dominant. An arachnoid granulation is noted in the lateral aspect of the left  transverse sinus. T2 hyperintensity in the left sigmoid sinus on MRI likely reflects slow flow. IMPRESSION: No evidence of dural venous sinus thrombosis. Electronically Signed   By: Logan Bores M.D.   On: 03/31/2021 10:01   DG HIP UNILAT WITH PELVIS 2-3 VIEWS LEFT  Result Date: 03/28/2021 CLINICAL DATA:  Fracture neck of left femur, fluoroscopic assistance for internal fixation EXAM: DG HIP (WITH OR WITHOUT PELVIS) 2-3V LEFT COMPARISON:  03/27/2021 FINDINGS:  Fluoroscopic images show 3 surgical screws transfixing the subcapital fracture of left femur. Fluoroscopic time was 22 seconds. Radiation dose is 4.1 mGy. IMPRESSION: Fluoroscopic assistance was provided for internal fixation of fracture of neck of left femur. Electronically Signed   By: Elmer Picker M.D.   On: 03/28/2021 20:02   DG Hip Unilat W or Wo Pelvis 2-3 Views Left  Result Date: 03/27/2021 CLINICAL DATA:  [place] yesterday with left hip pain. EXAM: DG HIP (WITH OR WITHOUT PELVIS) 2-3V LEFT COMPARISON:  None. FINDINGS: There is a fracture of the left femoral neck with slight impaction. No intertrochanteric component. The bones of the pelvis are negative. IMPRESSION: Left femoral neck fracture with minimal impaction. Electronically Signed   By: Nelson Chimes M.D.   On: 03/27/2021 16:10   CT HEAD CODE STROKE WO CONTRAST  Result Date: 04/16/2021 CLINICAL DATA:  Code stroke.  Slurred speech, weakness, drooling EXAM: CT HEAD WITHOUT CONTRAST TECHNIQUE: Contiguous axial images were obtained from the base of the skull through the vertex without intravenous contrast. RADIATION DOSE REDUCTION: This exam was performed according to the departmental dose-optimization program which includes automated exposure control, adjustment of the mA and/or kV according to patient size and/or use of iterative reconstruction technique. COMPARISON:  CT/CTA head and neck 03/31/2021, MR head 03/30/2021 FINDINGS: Brain: There is no evidence of acute intracranial hemorrhage, extra-axial fluid collection, or acute infarct. Parenchymal volume is within normal limits for age. The ventricles are stable in size. 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 Small remote infarcts are again seen in the bilateral basal ganglia and cerebellar hemispheres. Additional patchy hypodensity in the subcortical and periventricular white matter likely reflects sequela of moderate chronic white matter microangiopathy. There is no  mass lesion.  There is no midline shift. Vascular: There is calcification of the bilateral cavernous ICAs. 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 Skull: Normal. Negative for fracture or focal lesion. Sinuses/Orbits: The imaged paranasal sinuses are clear. Bilateral lens implants are in place. The globes and orbits are otherwise unremarkable. Other: None. ASPECTS [facility] Stroke Program Early CT Score) - Ganglionic level infarction (caudate, lentiform nuclei, internal capsule, insula, M1-M3 cortex): 7 - Supraganglionic infarction (M4-M6 cortex): 3 Total score (0-10 with 10 being normal): 10 IMPRESSION: 1. No acute intracranial pathology. 2. ASPECTS is 10 These results were called by telephone at the time of interpretation on 04/16/2021 at 9:48 am to provider [facility] , who verbally acknowledged these results. Electronically Signed   By: Valetta Mole M.D.   On: 04/16/2021 09:51   CT ANGIO HEAD NECK W WO CM (CODE STROKE)  Result Date: 04/16/2021 CLINICAL DATA:  88 year old female code stroke presentation. Known left PICA aneurysm. EXAM: CT ANGIOGRAPHY HEAD AND NECK TECHNIQUE: Multidetector CT imaging of the head and neck was performed using the standard protocol during bolus administration of intravenous contrast. Multiplanar CT image reconstructions and MIPs were obtained to evaluate the vascular anatomy. Carotid stenosis measurements (when applicable) are obtained utilizing NASCET criteria, using the distal internal  carotid diameter as the denominator. RADIATION DOSE REDUCTION: This exam was performed according to the departmental dose-optimization program which includes automated exposure control, adjustment of the mA and/or kV according to patient size and/or use of iterative reconstruction technique. CONTRAST:  50mL OMNIPAQUE IOHEXOL 350 MG/ML SOLN COMPARISON:  Plain head CT 0943 hours today. CTA head and neck 03/31/2021. FINDINGS: CTA NECK Skeleton: Torus palatini is, normal variant, and absent  maxillary dentition again noted. 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 No acute osseous abnormality identified. Upper chest: Negative; Visible upper lobe pulmonary artery branches now appear grossly patent. Other neck: Heterogeneously enlarged right thyroid lobe with hypodense nodules individually about 14 mm, stable. Otherwise negative. Aortic arch: 3 vessel arch configuration. Calcified aortic atherosclerosis. Right carotid system: Brachiocephalic artery plaque without stenosis. Tortuous proximal right CCA. Calcified plaque at the right ICA origin and bulb is stable with less than 50 % stenosis with respect to the distal vessel. Left carotid system: Mild left CCA origin plaque without stenosis. Tortuous proximal left CCA. Stable calcified plaque at the left ICA origin without stenosis. Vertebral arteries: Stable proximal right subclavian artery calcified plaque without stenosis. Normal right vertebral artery origin. Right vertebral artery is patent to the skull base without stenosis. Mild contralateral left subclavian artery plaque without stenosis. Normal left vertebral artery origin. Tortuous left V1 segment. Left vertebral artery is patent to the skull base without stenosis. CTA HEAD Posterior circulation: Fairly codominant distal vertebral arteries are patent to the vertebrobasilar junction without stenosis. Fairly large, saccular, 8-9 mm aneurysm of the right vertebral artery V4 segment at or near the left PICA origin is stable on series 7, image 122. Normal right PICA origin. Patent basilar artery without stenosis. Normal SCA and PCA origins. Posterior communicating arteries are diminutive or absent. Bilateral PCA branches are within normal limits. Anterior circulation: Both ICA siphons are patent mild to moderate siphon calcified plaque mostly in the cavernous and supraclinoid segments appears stable with only mild stenosis on the left. Normal ophthalmic artery origins. Patent carotid  termini. Patent MCA and ACA origins. Diminutive or absent anterior communicating artery. Bilateral ACA branches are stable and within normal limits. Left MCA M1 segment and bifurcation are patent without stenosis. However, there is a left M3 branch occlusion well demonstrated on series 12, image 29, also on series 9, image 140 and series 7, image 80. This is in the middle sylvian division. Other left MCA branches appear stable. Right MCA M1 segment and trifurcation are patent without stenosis. Right MCA branches are within normal limits. Venous sinuses: Patent. Anatomic variants: None. Review of the MIP images confirms the above findings IMPRESSION: 1. Negative for LV0 but Positive for a new Left MCA M3 branch occlusion in the middle Sylvian division. 2. 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 3. Stable carotid andatherosclerosis without significant stenosis. Aortic Atherosclerosis (ICD10-I70.0). 4. Stable right thyroid Nodules. In the setting of significant comorbidities or limited life expectancy, no follow-up recommended (ref: J Am Coll Radiol. 2015 Feb;12(2): 143-50). Study discussed by telephone with Dr. Valora Piccolo on 04/16/2021 at 10:09 . Electronically Signed   By: Genevie Ann M.D.   On: 04/16/2021 10:11    Microbiology: Results for orders placed or performed during the hospital encounter of 04/16/21  Resp Panel by RT-PCR (Flu A&B, Covid) Nasopharyngeal Swab     Status: None   Collection Time: 04/16/21 10:17 AM   Specimen: Nasopharyngeal Swab; Nasopharyngeal(NP) swabs in vial transport medium  Result Value  Ref Range Status   SARS Coronavirus 2 by RT PCR NEGATIVE NEGATIVE Final    Comment: (NOTE) SARS-CoV-2 target nucleic acids are NOT DETECTED.  The SARS-CoV-2 RNA is generally detectable in upper respiratory specimens during the acute phase of infection. The lowest concentration of SARS-CoV-2 viral copies this assay can detect is 138 copies/mL. A  negative result does not preclude SARS-Cov-2 infection and should not be used as the sole basis for treatment or other patient management decisions. A negative result may occur with  improper specimen collection/handling, submission of specimen other than nasopharyngeal swab, presence of viral mutation(s) within the areas targeted by this assay, and inadequate number of viral copies(<138 copies/mL). A negative result must be combined with clinical observations, patient history, and epidemiological information. The expected result is Negative.  Fact Sheet for Patients:  EntrepreneurPulse.com.au  Fact Sheet for Healthcare Providers:  IncredibleEmployment.be  This test is no t yet approved or cleared by the [place] FDA and  has been authorized for detection and/or diagnosis of SARS-CoV-2 by FDA under an Emergency Use Authorization (EUA). This EUA will remain  in effect (meaning this test can be used) for the duration of the COVID-19 declaration under Section 564(b)(1) of the Act, 21 U.S.C.section 360bbb-3(b)(1), unless the authorization is terminated  or revoked sooner.       Influenza A by PCR NEGATIVE NEGATIVE Final   Influenza B by PCR NEGATIVE NEGATIVE Final    Comment: (NOTE) The Xpert Xpress SARS-CoV-2/FLU/RSV plus assay is intended as an aid in the diagnosis of influenza from Nasopharyngeal swab specimens and should not be used as a sole basis for treatment. Nasal washings and aspirates are unacceptable for Xpert Xpress SARS-CoV-2/FLU/RSV testing.  Fact Sheet for Patients: EntrepreneurPulse.com.au  Fact Sheet for Healthcare Providers: IncredibleEmployment.be  This test is not yet approved or cleared by the [place] FDA and has been authorized for detection and/or diagnosis of SARS-CoV-2 by FDA under an Emergency Use Authorization (EUA). This EUA will remain in effect (meaning this test can  be used) for the duration of the COVID-19 declaration under Section 564(b)(1) of the Act, 21 U.S.C. section 360bbb-3(b)(1), unless the authorization is terminated or revoked.  Performed at [facility], [address] [place], [place] [address]     Labs: CBC: Recent Labs  Lab 04/16/21 0937  WBC 7.9  NEUTROABS 4.6  HGB 10.3*  HCT 33.5*  MCV 101.2*  PLT 229*   Basic Metabolic Panel: Recent Labs  Lab 04/16/21 1827  NA 138  K 3.7  CL 107  CO2 24  GLUCOSE 127*  BUN 17  CREATININE 0.95  CALCIUM 9.8   Liver Function Tests: Recent Labs  Lab 04/16/21 1827  AST 20  ALT 13  ALKPHOS 88  BILITOT 0.6  PROT 6.6  ALBUMIN 3.7   CBG: Recent Labs  Lab 04/16/21 0935  GLUCAP 95    Discharge time spent: greater than 30 minutes.  Signed: Loletha Grayer, MD [facility] 04/18/2021

## 2021-04-18 NOTE — Progress Notes (Signed)
*  PRELIMINARY RESULTS* Echocardiogram Echocardiogram Transesophageal has been performed.  Sherrie Sport 04/18/2021, 1:05 PM

## 2021-04-18 NOTE — Progress Notes (Signed)
[facility] Encounter Note  Patient: Chelsea Hart / Admit Date: 04/16/2021 / Date of Encounter: 04/18/2021, 1:59 PM   Subjective: No significant new symptoms or vascular changes from yesterday..  Patient tolerated transesophageal echocardiogram showing normal LV systolic function with ejection fraction of 65% and no evidence of valvular heart disease left atrial thrombus or patent foramen ovale or atrial septal defect or other primary sources of cardiac embolism.  The patient did have moderate atherosclerosis of the aortic arch and descending aorta  Review of Systems: Positive for: None Negative for: Vision change, hearing change, syncope, dizziness, nausea, vomiting,diarrhea, bloody stool, stomach pain, cough, congestion, diaphoresis, urinary frequency, urinary pain,skin lesions, skin rashes Others previously listed  Objective: Telemetry: Normal sinus rhythm Physical Exam: Blood pressure (!) 125/59, pulse 60, temperature 97.9 F (36.6 C), temperature source Oral, resp. rate 14, height 5\' 3"  (1.6 m), weight 70.3 kg, SpO2 94 %. Body mass index is 27.46 kg/m. General: Well developed, well nourished, in no acute distress. Head: Normocephalic, atraumatic, sclera non-icteric, no xanthomas, nares are without discharge. Neck: No apparent masses Lungs: Normal respirations with no wheezes, no rhonchi, no rales , no crackles   Heart: Regular rate and rhythm, normal S1 S2, no murmur, no rub, no gallop, PMI is normal size and placement, carotid upstroke normal without bruit, jugular venous pressure normal Abdomen: Soft, non-tender, non-distended with normoactive bowel sounds. No hepatosplenomegaly. Abdominal aorta is normal size without bruit Extremities: No edema, no clubbing, no cyanosis, no ulcers,  Peripheral: 2+ radial, 2+ femoral, 2+ dorsal pedal pulses Neuro: Alert and oriented. Moves all extremities spontaneously. Psych:  Responds to questions appropriately with a  normal affect.   Intake/Output Summary (Last 24 hours) at 04/18/2021 1359 Last data filed at 04/17/2021 1841 Gross per 24 hour  Intake 480 ml  Output --  Net 480 ml    Inpatient Medications:   [MAR Hold]  stroke: mapping our early stages of recovery book   Does not apply Once   [MAR Hold] ascorbic acid  500 mg Oral Daily   [MAR Hold] aspirin EC  81 mg Oral Daily   [MAR Hold] atorvastatin  80 mg Oral Daily   [MAR Hold] calcium carbonate  1 tablet Oral Daily   [MAR Hold] cholecalciferol  2,000 Units Oral Daily   [MAR Hold] feeding supplement  237 mL Oral BID BM   [MAR Hold] fenofibrate  54 mg Oral Daily   fentaNYL       [MAR Hold] latanoprost  1 drop Both Eyes QHS   [MAR Hold] melatonin  5 mg Oral QHS   midazolam       [MAR Hold] PARoxetine  40 mg Oral Daily   [MAR Hold] senna-docusate  1 tablet Oral Daily   sodium chloride flush       Infusions:   sodium chloride 20 mL/hr at 04/18/21 0819    Labs: Recent Labs    04/16/21 1827  NA 138  K 3.7  CL 107  CO2 24  GLUCOSE 127*  BUN 17  CREATININE 0.95  CALCIUM 9.8   Recent Labs    04/16/21 1827  AST 20  ALT 13  ALKPHOS 88  BILITOT 0.6  PROT 6.6  ALBUMIN 3.7   Recent Labs    04/16/21 0937  WBC 7.9  NEUTROABS 4.6  HGB 10.3*  HCT 33.5*  MCV 101.2*  PLT 426*   No results for input(s): CKTOTAL, CKMB, TROPONINI in the last 72 hours. Invalid input(s): [place]  04/17/21 0415  HGBA1C 5.4     Weights: Filed Weights   04/16/21 0953  Weight: 70.3 kg     Radiology/Studies:  CT ANGIO HEAD NECK W WO CM  Addendum Date: 03/31/2021   ADDENDUM REPORT: 03/31/2021 13:14 ADDENDUM: Thyroid nodules measuring up to 1.5 cm. Recommend thyroid US if clinically warranted given patient age. Reference: J Am Coll Radiol. 2015 Feb;12(2): 143-50 Electronically Signed   By: Logan Bores M.D.   On: 03/31/2021 13:14   Result Date: 03/31/2021 CLINICAL DATA:  Neuro deficit, acute, stroke suspected + aneurysm L V4  segment?. Suspected left V4 segment aneurysm on MRI. EXAM: CT ANGIOGRAPHY HEAD AND NECK TECHNIQUE: Multidetector CT imaging of the head and neck was performed using the standard protocol during bolus administration of intravenous contrast. Multiplanar CT image reconstructions and MIPs were obtained to evaluate the vascular anatomy. Carotid stenosis measurements (when applicable) are obtained utilizing NASCET criteria, using the distal internal carotid diameter as the denominator. RADIATION DOSE REDUCTION: This exam was performed according to the departmental dose-optimization program which includes automated exposure control, adjustment of the mA and/or kV according to patient size and/or use of iterative reconstruction technique. CONTRAST:  63mL OMNIPAQUE IOHEXOL 350 MG/ML SOLN COMPARISON:  Head MRI 03/30/2021 FINDINGS: CT HEAD FINDINGS Brain: There is no evidence of an acute infarct, intracranial hemorrhage, mass, midline shift, or extra-axial fluid collection. Patchy to confluent hypodensities in the cerebral white matter bilaterally are unchanged and nonspecific but compatible with moderately extensive chronic small vessel ischemic disease. Chronic lacunar infarcts are again noted in the basal ganglia and cerebellum, and a small chronic cortical and subcortical infarct is again noted in the right frontal operculum and insula. There is mild cerebral atrophy. Vascular: Calcified atherosclerosis at the skull base. Skull: No fracture or suspicious osseous lesion. Sinuses: Paranasal sinuses and mastoid air cells are clear. Orbits: Bilateral cataract extraction. Review of the MIP images confirms the above findings CTA NECK FINDINGS Aortic arch: Standard 3 vessel aortic arch with moderate atherosclerotic plaque. Patent brachiocephalic and subclavian arteries without evidence of significant stenosis. Right carotid system: Patent with a small to moderate amount of predominantly calcified plaque at the carotid  bifurcation. No evidence of a significant stenosis or dissection. Left carotid system: Patent with a small amount of predominantly calcified plaque at the carotid bifurcation. No evidence of a significant stenosis or dissection. Retropharyngeal course of the distal common carotid artery. Vertebral arteries: Patent and codominant without evidence significant stenosis or dissection. Skeleton: Advanced cervical disc and facet degeneration. Other neck: Bilateral thyroid nodules measuring up to 1.5 cm. No evidence of cervical lymphadenopathy. Upper chest: Mild interlobular septal thickening and mild ground-glass opacity in the lung apices. Small volume distal segmental and subsegmental pulmonary arterial emboli in the right upper lobe. Review of the MIP images confirms the above findings CTA HEAD FINDINGS Anterior circulation: The internal carotid arteries are patent from skull base to carotid termini with mild atherosclerotic plaque bilaterally not resulting in significant stenosis. ACAs and MCAs are patent without evidence of a proximal branch occlusion or significant proximal stenosis. No aneurysm is identified. Posterior circulation: The intracranial vertebral arteries are widely patent to the basilar. A left PICA aneurysm measures 8 x 7 mm. The basilar artery is widely patent. Posterior communicating arteries are diminutive or absent. Both PCAs are patent with mild atherosclerotic irregularity but no evidence of a flow limiting proximal stenosis. Venous sinuses: More fully evaluated on the separate dedicated CT venogram. Anatomic variants: None. Review of the MIP  images confirms the above findings IMPRESSION: 1. Distal segmental and subsegmental pulmonary arterial emboli in the right upper lobe. 2. 8 mm left PICA aneurysm. 3. Mild atherosclerosis in the head and neck without large vessel occlusion or significant proximal stenosis. 4. Suspected mild pulmonary edema. 5. Aortic Atherosclerosis (ICD10-I70.0). Critical  Value/emergent results were called by telephone at the time of interpretation on 03/31/2021 at 10:10 am to Dr. Su Monks, who verbally acknowledged these results. Electronically Signed: By: Logan Bores M.D. On: 03/31/2021 10:11   DG Chest 1 View  Result Date: 03/27/2021 CLINICAL DATA:  Weakness and fall.  Question infiltrate. EXAM: CHEST  1 VIEW COMPARISON:  04/14/2005 FINDINGS: Artifact overlies the chest. Heart size is normal. Mild aortic atherosclerotic tortuosity and calcification. The right chest is clear and normal. There is mild elevation of the left hemidiaphragm, with mild crowding of the markings at the lung base because of that, but the lung appears otherwise clear. No effusion. IMPRESSION: No acute or traumatic finding. Mild elevation of the left hemidiaphragm with mild crowding of the markings at the left base because of that. Electronically Signed   By: Nelson Chimes M.D.   On: 03/27/2021 16:13   CT HEAD WO CONTRAST (5MM)  Result Date: 03/29/2021 CLINICAL DATA:  Neuro deficit with acute stroke suspected EXAM: CT HEAD WITHOUT CONTRAST TECHNIQUE: Contiguous axial images were obtained from the base of the skull through the vertex without intravenous contrast. RADIATION DOSE REDUCTION: This exam was performed according to the departmental dose-optimization program which includes automated exposure control, adjustment of the mA and/or kV according to patient size and/or use of iterative reconstruction technique. COMPARISON:  Head CT 03/27/2021 FINDINGS: Brain: No evidence of acute infarction, hemorrhage, hydrocephalus, extra-axial collection or mass lesion/mass effect. Chronic small vessel ischemic gliosis. Chronic small-vessel infarcts at the right frontal operculum/upper insula and right cerebellum. Chronic lacunes at the deep gray nuclei. Vascular: No hyperdense vessel or unexpected calcification. Skull: Normal. Negative for fracture or focal lesion. Sinuses/Orbits: No acute finding.  IMPRESSION: 1. No acute finding. 2. Extensive chronic ischemic injury. Electronically Signed   By: Jorje Guild M.D.   On: 03/29/2021 11:48   CT HEAD WO CONTRAST (5MM)  Result Date: 03/27/2021 CLINICAL DATA:  Head trauma, minor (Age >= 65y) EXAM: CT HEAD WITHOUT CONTRAST TECHNIQUE: Contiguous axial images were obtained from the base of the skull through the vertex without intravenous contrast. RADIATION DOSE REDUCTION: This exam was performed according to the departmental dose-optimization program which includes automated exposure control, adjustment of the mA and/or kV according to patient size and/or use of iterative reconstruction technique. COMPARISON:  None. FINDINGS: Brain: No evidence of acute infarction, hemorrhage, hydrocephalus, extra-axial collection or mass lesion/mass effect. Focal area of encephalomalacia in the right frontal lobe compatible with remote ischemia. Multiple prior lacunar infarcts in the bilateral basal ganglia. Extensive low-density changes within the periventricular and subcortical white matter compatible with chronic microvascular ischemic change. Mild diffuse cerebral volume loss. Vascular: Atherosclerotic calcifications involving the large vessels of the skull base. No unexpected hyperdense vessel. Skull: Normal. Negative for fracture or focal lesion. Sinuses/Orbits: No acute finding. Other: None. IMPRESSION: 1. No acute intracranial abnormality. 2. Extensive chronic microvascular ischemic changes and cerebral volume loss. Electronically Signed   By: Davina Poke D.O.   On: 03/27/2021 16:40   CT Angio Chest Pulmonary Embolism (PE) W or WO Contrast  Result Date: 04/17/2021 CLINICAL DATA:  Pulmonary embolism EXAM: CT ANGIOGRAPHY CHEST WITH CONTRAST TECHNIQUE: Multidetector CT imaging of the chest  was performed using the standard protocol during bolus administration of intravenous contrast. Multiplanar CT image reconstructions and MIPs were obtained to evaluate the  vascular anatomy. RADIATION DOSE REDUCTION: This exam was performed according to the departmental dose-optimization program which includes automated exposure control, adjustment of the mA and/or kV according to patient size and/or use of iterative reconstruction technique. CONTRAST:  54mL OMNIPAQUE IOHEXOL 350 MG/ML SOLN COMPARISON:  04/01/2021 FINDINGS: Cardiovascular: There is homogeneous enhancement in the thoracic aorta. There is mild ectasia of ascending thoracic aorta. There are scattered coronary artery calcifications. There are small filling defects in the subsegmental branches in the right upper lobe and right lower lobe. There is interval decrease in number of filling defects in the right lower lobe. There are no new intraluminal filling defects in the pulmonary artery branches. There are no signs of right ventricular strain. Mediastinum/Nodes: No significant lymphadenopathy seen in mediastinum. Thyroid is enlarged with inhomogeneous attenuation. Lungs/Pleura: There are no new focal pulmonary infiltrates. There is no pleural effusion or pneumothorax. Upper Abdomen: There is 4.1 cm fluid density structure in the left lobe of liver, possibly a cyst. Musculoskeletal: Unremarkable. Review of the MIP images confirms the above findings. IMPRESSION: There is interval decrease in number of filling defects in subsegmental pulmonary artery branches in the right lower lobe. There are residual small filling defects in segmental/subsegmental branches in the right lung. There are no new foci of pulmonary embolism. There is no evidence of right ventricular strain. There are no new focal pulmonary infiltrates. There is no pleural effusion. Coronary artery calcifications are seen. Other findings as described in the body of the report. These results will be called to the ordering clinician or representative by the Radiologist Assistant, and communication documented in the PACS or [employer]. Electronically Signed    By: Elmer Picker M.D.   On: 04/17/2021 12:04   CT Angio Chest Pulmonary Embolism (PE) W or WO Contrast  Result Date: 04/01/2021 CLINICAL DATA:  A female at age 15 presents with shortness of breath and concern for pulmonary embolism. EXAM: CT ANGIOGRAPHY CHEST WITH CONTRAST TECHNIQUE: Multidetector CT imaging of the chest was performed using the standard protocol during bolus administration of intravenous contrast. Multiplanar CT image reconstructions and MIPs were obtained to evaluate the vascular anatomy. RADIATION DOSE REDUCTION: This exam was performed according to the departmental dose-optimization program which includes automated exposure control, adjustment of the mA and/or kV according to patient size and/or use of iterative reconstruction technique. CONTRAST:  97mL OMNIPAQUE IOHEXOL 350 MG/ML SOLN COMPARISON:  None FINDINGS: Cardiovascular: Cardiomegaly. Signs of coronary artery calcification of LEFT and RIGHT coronary circulation. No substantial pericardial effusion. Calcified and noncalcified atheromatous plaque of the thoracic aorta. Aorta is nonaneurysmal at 3.7 cm of the ascending thoracic aorta. Normal caliber of the descending thoracic aorta. Main pulmonary artery is well opacified as are peripheral branches, density in the main pulmonary artery 514 Hounsfield units. Bilateral subsegmental in segmental level emboli to the lower lobes. (Image 147/5) RIGHT lower lobe branch involvement at the segmental level. (Image 172/5) LEFT lower lobe subsegmental level involvement. No main pulmonary arterial or lobar level emboli. RIGHT upper lobe segmental pulmonary arterial embolus (image 97/5) Mediastinum/Nodes: No thoracic inlet, axillary, mediastinal or hilar adenopathy. Esophagus grossly normal. Lungs/Pleura: Basilar atelectasis and small pleural effusions. No lobar consolidation. Scarring along the RIGHT heart border. Airways are patent. Upper Abdomen: Incidental imaging of upper abdominal  contents with incomplete imaging of a moderately large hepatic cyst, purely cystic with respect to  visualized portions. Adrenal glands are normal. Musculoskeletal: No acute musculoskeletal process. Spinal degenerative changes. Review of the MIP images confirms the above findings. IMPRESSION: 1. Positive for RIGHT-sided segmental and subsegmental pulmonary emboli and LEFT lower lobe subsegmental pulmonary embolism. 2. Small bilateral pleural effusions with basilar atelectasis. 3. Cardiomegaly with coronary artery calcification of LEFT and RIGHT coronary circulation. 4. Aortic atherosclerosis. Aortic Atherosclerosis (ICD10-I70.0). Critical Value/emergent results were called by telephone at the time of interpretation on 04/01/2021 at 12:51 pm to provider [facility] , who verbally acknowledged these results. Electronically Signed   By: Zetta Bills M.D.   On: 04/01/2021 12:52   CT Cervical Spine Wo Contrast  Result Date: 03/27/2021 CLINICAL DATA:  Neck trauma EXAM: CT CERVICAL SPINE WITHOUT CONTRAST TECHNIQUE: Multidetector CT imaging of the cervical spine was performed without intravenous contrast. Multiplanar CT image reconstructions were also generated. RADIATION DOSE REDUCTION: This exam was performed according to the departmental dose-optimization program which includes automated exposure control, adjustment of the mA and/or kV according to patient size and/or use of iterative reconstruction technique. COMPARISON:  None. FINDINGS: Alignment: Grade 1 anterolisthesis of C3 on C4 and C7 on T1. No evidence of acute subluxation. Skull base and vertebrae: No acute fracture. No primary bone lesion or focal pathologic process. Soft tissues and spinal canal: No prevertebral fluid or swelling. No visible canal hematoma. Disc levels: Moderate to severe intervertebral disc space narrowing from C4 through C7. Associated endplate sclerosis and dorsal endplate osteophytes most prominent at C5-C6. Bilateral facet  arthropathy. Mild neural foraminal narrowing at C5-C6 on the right. Upper chest: Heterogeneous thyroid gland with likely nodules on the right measuring up to 1.5 cm. Mild biapical pleural thickening. Other: None. IMPRESSION: 1. No acute fracture or subluxation identified. Degenerative changes of the cervical spine. 2. Heterogeneous nodular thyroid gland, consider follow-up ultrasound. Electronically Signed   By: Ofilia Neas M.D.   On: 03/27/2021 16:37   MR BRAIN WO CONTRAST  Addendum Date: 04/16/2021   ADDENDUM REPORT: 04/16/2021 13:28 ADDENDUM: These results were called by telephone at the time of interpretation on 04/16/2021 at 1:27 pm to provider TOCHUKWU AGBATA , who verbally acknowledged these results. Electronically Signed   By: Franchot Gallo M.D.   On: 04/16/2021 13:28   Result Date: 04/16/2021 CLINICAL DATA:  Acute neuro deficit EXAM: MRI HEAD WITHOUT CONTRAST TECHNIQUE: Multiplanar, multiecho pulse sequences of the brain and surrounding structures were obtained without intravenous contrast. COMPARISON:  CT angio head and neck 04/16/2021.  MRI head 03/20/2021 FINDINGS: Brain: Small diffusion hyperintensity in the left insula. There is associated susceptibility in this area. These findings were not present on the recent MRI of 03/30/2021 therefore likely represent small acute hemorrhagic infarct. Mild atrophy. Moderate chronic microvascular ischemic change in the white matter, basal ganglia, and pons. Small chronic infarcts in the cerebellum bilaterally. Chronic microhemorrhage also in the right frontal lobe Vascular: Normal arterial flow voids at the skull base. 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 Skull and upper cervical spine: No focal skeletal lesion. Sinuses/Orbits: Paranasal sinuses clear. Bilateral cataract extraction Other: None IMPRESSION: Probable small acute infarct in the left insular cortex with associated mild hemorrhage. 6  x 9 mm aneurysm left distal vertebral artery unchanged from prior studies Atrophy and moderate chronic microvascular ischemia. Electronically Signed: By: Franchot Gallo M.D. On: 04/16/2021 12:48   MR BRAIN WO CONTRAST  Result Date: 03/30/2021 CLINICAL DATA:  Right facial droop following hip surgery EXAM: MRI  HEAD WITHOUT CONTRAST TECHNIQUE: Multiplanar, multiecho pulse sequences of the brain and surrounding structures were obtained without intravenous contrast. COMPARISON:  Noncontrast CT head dated 1 day prior FINDINGS: Brain: There is no evidence of acute intracranial hemorrhage, extra-axial fluid collection, or acute infarct. There is mild global parenchymal volume loss with prominence of the ventricular system and extra-axial CSF spaces. There are multiple remote infarcts in the bilateral basal ganglia, bilateral cerebellar hemispheres, and right frontal lobe. There is extensive patchy FLAIR signal abnormality in the subcortical and periventricular white matter likely reflecting sequela of moderate chronic white matter microangiopathy. There is no mass lesion.  There is no midline shift. Vascular: The major arterial flow voids are present. There is a suspected 6 mm aneurysm arising from the left V4 segment (10-6). There is abnormal T2 hyperintensity in the left sigmoid sinus. Skull and upper cervical spine: Normal marrow signal. Sinuses/Orbits: The paranasal sinuses are clear. Bilateral lens implants are in place. The globes and orbits are otherwise unremarkable. Other: None. IMPRESSION: 1. Abnormal T2 signal in the left sigmoid sinus could reflect slow flow; however, this could also reflect venous sinus thrombosis. 2. Suspected aneurysm arising from the left V4 segment. Recommend CTA and CTV for further evaluation of both of these findings. 3. Otherwise, no acute intracranial pathology. 4. Multiple remote infarcts on a background of moderate chronic white matter microangiopathy as above. Electronically  Signed   By: Valetta Mole M.D.   On: 03/30/2021 18:21   CT ABDOMEN PELVIS W CONTRAST  Result Date: 04/17/2021 CLINICAL DATA:  Inpatient. History of ovarian cancer. History of recent pulmonary embolism and acute CVA. Restaging. EXAM: CT ABDOMEN AND PELVIS WITH CONTRAST TECHNIQUE: Multidetector CT imaging of the abdomen and pelvis was performed using the standard protocol following bolus administration of intravenous contrast. RADIATION DOSE REDUCTION: This exam was performed according to the departmental dose-optimization program which includes automated exposure control, adjustment of the mA and/or kV according to patient size and/or use of iterative reconstruction technique. CONTRAST:  57mL OMNIPAQUE IOHEXOL 350 MG/ML SOLN COMPARISON:  04/15/2005 CT abdomen/pelvis. FINDINGS: Lower chest: No significant pulmonary nodules or acute consolidative airspace disease. Coronary atherosclerosis. Hepatobiliary: Normal liver size. Simple 3.9 cm upper left liver cyst. No additional liver lesions. Cholecystectomy. No biliary ductal dilatation. Multiple small to moderate periampullary duodenal diverticula. Pancreas: Normal, with no mass or duct dilation. Spleen: Normal size. No mass. Adrenals/Urinary Tract: Normal adrenals. Hypodense 1.9 cm inferior left renal cortical lesion, new from 2007 CT. Simple 1.7 cm interpolar left renal cortical cyst. Subcentimeter hypodense upper left renal cortical lesion, too small to characterize. No hydronephrosis. Normal nondistended bladder. Stomach/Bowel: Normal non-distended stomach. Normal caliber small bowel with no small bowel wall thickening. Apparent appendectomy. Moderate rectal stool. Minimal left colonic diverticulosis. Borderline mild wall thickening with slight haziness of the pericolonic fat in the cecum and ascending colon. Vascular/Lymphatic: Atherosclerotic nonaneurysmal abdominal aorta. Patent portal, splenic, hepatic and renal veins. No pathologically enlarged lymph nodes in  the abdomen or pelvis. Reproductive: Status post hysterectomy, with no abnormal findings at the vaginal cuff. No adnexal mass. Other: No pneumoperitoneum, ascites or focal fluid collection. No discrete peritoneal nodularity. Musculoskeletal: No aggressive appearing focal osseous lesions. Moderate thoracolumbar spondylosis. Intact left femoral neck pins. IMPRESSION: 1. No evidence of metastatic disease in the abdomen or pelvis. 2. Borderline mild right colonic wall thickening and slight haziness of the pericolonic fat, cannot exclude a minimal infectious or inflammatory colitis. 3. Indeterminate hypodense 1.9 cm inferior left renal cortical lesion, new from  2007 CT. Dedicated outpatient MRI (preferred) or CT abdomen without and with IV contrast is indicated for further characterization and may be obtained as clinically warranted. 4. Minimal left colonic diverticulosis. 5. Coronary atherosclerosis. 6. Aortic Atherosclerosis (ICD10-I70.0). Electronically Signed   By: Ilona Sorrel M.D.   On: 04/17/2021 12:37   US Venous Img Lower Bilateral (DVT)  Result Date: 03/31/2021 CLINICAL DATA:  88 year old female with history of pulmonary embolism. EXAM: BILATERAL LOWER EXTREMITY VENOUS DOPPLER ULTRASOUND TECHNIQUE: Gray-scale sonography with graded compression, as well as color Doppler and duplex ultrasound were performed to evaluate the lower extremity deep venous systems from the level of the common femoral vein and including the common femoral, femoral, profunda femoral, popliteal and calf veins including the posterior tibial, peroneal and gastrocnemius veins when visible. The superficial great saphenous vein was also interrogated. Spectral Doppler was utilized to evaluate flow at rest and with distal augmentation maneuvers in the common femoral, femoral and popliteal veins. COMPARISON:  None. FINDINGS: RIGHT LOWER EXTREMITY Common Femoral Vein: No evidence of thrombus. Normal compressibility, respiratory phasicity and  response to augmentation. Saphenofemoral Junction: No evidence of thrombus. Normal compressibility and flow on color Doppler imaging. Profunda Femoral Vein: No evidence of thrombus. Normal compressibility and flow on color Doppler imaging. Femoral Vein: No evidence of thrombus. Normal compressibility, respiratory phasicity and response to augmentation. Popliteal Vein: No evidence of thrombus. Normal compressibility, respiratory phasicity and response to augmentation. Calf Veins: No evidence of thrombus. Normal compressibility and flow on color Doppler imaging. Other Findings:  None. LEFT LOWER EXTREMITY Common Femoral Vein: No evidence of thrombus. Normal compressibility, respiratory phasicity and response to augmentation. Saphenofemoral Junction: No evidence of thrombus. Normal compressibility and flow on color Doppler imaging. Profunda Femoral Vein: No evidence of thrombus. Normal compressibility and flow on color Doppler imaging. Femoral Vein: No evidence of thrombus. Normal compressibility, respiratory phasicity and response to augmentation. Popliteal Vein: No evidence of thrombus. Normal compressibility, respiratory phasicity and response to augmentation. Calf Veins: No evidence of thrombus. Normal compressibility and flow on color Doppler imaging. Other Findings:  None. IMPRESSION: No evidence of bilateral lower extremity deep venous thrombosis. Ruthann Cancer, MD [facility] Radiology Electronically Signed   By: Ruthann Cancer M.D.   On: 03/31/2021 12:17   DG Knee Complete 4 Views Left  Result Date: 03/27/2021 CLINICAL DATA:  [place] yesterday.  Knee pain. EXAM: LEFT KNEE - COMPLETE 4+ VIEW COMPARISON:  None. FINDINGS: No joint effusion. No fracture. Chronic appearing joint space narrowing with chondrocalcinosis, typical of age. No focal lesion. IMPRESSION: No acute or traumatic finding. Joint space narrowing and chondrocalcinosis. Electronically Signed   By:  Nelson Chimes M.D.   On: 03/27/2021 16:11   DG C-Arm 1-60 Min-No Report  Result Date: 03/28/2021 Fluoroscopy was utilized by the requesting physician.  No radiographic interpretation.   ECHOCARDIOGRAM COMPLETE  Result Date: 04/17/2021    ECHOCARDIOGRAM REPORT   Patient Name:   Chelsea Hart Date of Exam: 04/17/2021 Medical Rec #:  [id number]      Height:       63.0 in Accession #:    [id number]     Weight:       155.0 lb Date of Birth:  [birth year]       BSA:          1.735 m Patient Age:    14 years       BP:           132/61 mmHg Patient  Gender: F              HR:           51 bpm. Exam Location:  [facility] Procedure: 2D Echo, Color Doppler and Cardiac Doppler Indications:     Stroke I63.9  History:         Patient has no prior history of Echocardiogram examinations.                  Anxiety.  Sonographer:     Sherrie Sport Referring Phys:  [place] Diagnosing Phys: Serafina Royals MD  Sonographer Comments: Suboptimal apical window. IMPRESSIONS  1. Left ventricular ejection fraction, by estimation, is 60 to 65%. The left ventricle has normal function. The left ventricle has no regional wall motion abnormalities. Left ventricular diastolic parameters were normal.  2. Right ventricular systolic function is normal. The right ventricular size is normal.  3. The mitral valve is normal in structure. Mild mitral valve regurgitation.  4. The aortic valve is normal in structure. Aortic valve regurgitation is not visualized. FINDINGS  Left Ventricle: Left ventricular ejection fraction, by estimation, is 60 to 65%. The left ventricle has normal function. The left ventricle has no regional wall motion abnormalities. The left ventricular internal cavity size was normal in size. There is  no left ventricular hypertrophy. Left ventricular diastolic parameters were normal. Right Ventricle: The right ventricular size is normal. No increase in right ventricular wall thickness. Right ventricular systolic function is normal.  Left Atrium: Left atrial size was normal in size. Right Atrium: Right atrial size was normal in size. Pericardium: There is no evidence of pericardial effusion. Mitral Valve: The mitral valve is normal in structure. Mild mitral valve regurgitation. MV peak gradient, 8.2 mmHg. The mean mitral valve gradient is 2.0 mmHg. Tricuspid Valve: The tricuspid valve is normal in structure. Tricuspid valve regurgitation is mild. Aortic Valve: The aortic valve is normal in structure. Aortic valve regurgitation is not visualized. Aortic valve mean gradient measures 6.0 mmHg. Aortic valve peak gradient measures 9.2 mmHg. Aortic valve area, by VTI measures 4.24 cm. Pulmonic Valve: The pulmonic valve was normal in structure. Pulmonic valve regurgitation is not visualized. Aorta: The aortic root and ascending aorta are structurally normal, with no evidence of dilitation. IAS/Shunts: No atrial level shunt detected by color flow Doppler.  LEFT VENTRICLE PLAX 2D LVIDd:         5.00 cm   Diastology LVIDs:         3.40 cm   LV e' medial:    4.57 cm/s LV PW:         1.20 cm   LV E/e' medial:  21.4 LV IVS:        1.00 cm   LV e' lateral:   6.64 cm/s LVOT diam:     2.00 cm   LV E/e' lateral: 14.7 LV SV:         146 LV SV Index:   84 LVOT Area:     3.14 cm  RIGHT VENTRICLE RV Basal diam:  3.50 cm RV S prime:     15.70 cm/s TAPSE (M-mode): 2.6 cm LEFT ATRIUM              Index        RIGHT ATRIUM           Index LA diam:        3.90 cm  2.25 cm/m   RA Area:     17.30  cm LA Vol (A2C):   122.0 ml 70.31 ml/m  RA Volume:   55.10 ml  31.76 ml/m LA Vol (A4C):   73.5 ml  42.36 ml/m LA Biplane Vol: 105.0 ml 60.51 ml/m  AORTIC VALVE                     PULMONIC VALVE AV Area (Vmax):    3.58 cm      PV Vmax:        0.88 m/s AV Area (Vmean):   3.80 cm      PV Vmean:       60.000 cm/s AV Area (VTI):     4.24 cm      PV VTI:         0.196 m AV Vmax:           152.00 cm/s   PV Peak grad:   3.1 mmHg AV Vmean:          110.000 cm/s  PV Mean grad:    2.0 mmHg AV VTI:            0.344 m       RVOT Peak grad: 6 mmHg AV Peak Grad:      9.2 mmHg AV Mean Grad:      6.0 mmHg LVOT Vmax:         173.00 cm/s LVOT Vmean:        133.000 cm/s LVOT VTI:          0.464 m LVOT/AV VTI ratio: 1.35  AORTA Ao Root diam: 3.37 cm MITRAL VALVE                TRICUSPID VALVE MV Area (PHT): 2.47 cm     TR Peak grad:   24.4 mmHg MV Area VTI:   3.49 cm     TR Vmax:        247.00 cm/s MV Peak grad:  8.2 mmHg MV Mean grad:  2.0 mmHg     SHUNTS MV Vmax:       1.43 m/s     Systemic VTI:  0.46 m MV Vmean:      70.1 cm/s    Systemic Diam: 2.00 cm MV Decel Time: 307 msec     Pulmonic VTI:  0.253 m MV E velocity: 97.70 cm/s MV A velocity: 109.00 cm/s MV E/A ratio:  0.90 Serafina Royals MD Electronically signed by Serafina Royals MD Signature Date/Time: 04/17/2021/12:19:31 PM    Final    CT VENOGRAM HEAD  Result Date: 03/31/2021 CLINICAL DATA:  Dural venous sinus thrombosis suspected. Abnormal T2 signal in the left sigmoid sinus on MRI brain. EXAM: CT VENOGRAM HEAD TECHNIQUE: Venographic phase images of the brain were obtained following the administration of intravenous contrast. Multiplanar reformats and maximum intensity projections were generated. RADIATION DOSE REDUCTION: This exam was performed according to the departmental dose-optimization program which includes automated exposure control, adjustment of the mA and/or kV according to patient size and/or use of iterative reconstruction technique. CONTRAST:  74mL OMNIPAQUE IOHEXOL 350 MG/ML SOLN COMPARISON:  Head MRI 03/30/2021 FINDINGS: The superior sagittal sinus, internal cerebral veins, vein of Galen, straight sinus, transverse sinuses, sigmoid sinuses, and jugular bulbs are patent without evidence of thrombus. The right transverse and sigmoid sinuses are dominant. An arachnoid granulation is noted in the lateral aspect of the left transverse sinus. T2 hyperintensity in the left sigmoid sinus on MRI likely reflects slow flow.  IMPRESSION: No evidence of dural venous sinus thrombosis.  Electronically Signed   By: Logan Bores M.D.   On: 03/31/2021 10:01   DG HIP UNILAT WITH PELVIS 2-3 VIEWS LEFT  Result Date: 03/28/2021 CLINICAL DATA:  Fracture neck of left femur, fluoroscopic assistance for internal fixation EXAM: DG HIP (WITH OR WITHOUT PELVIS) 2-3V LEFT COMPARISON:  03/27/2021 FINDINGS: Fluoroscopic images show 3 surgical screws transfixing the subcapital fracture of left femur. Fluoroscopic time was 22 seconds. Radiation dose is 4.1 mGy. IMPRESSION: Fluoroscopic assistance was provided for internal fixation of fracture of neck of left femur. Electronically Signed   By: Elmer Picker M.D.   On: 03/28/2021 20:02   DG Hip Unilat W or Wo Pelvis 2-3 Views Left  Result Date: 03/27/2021 CLINICAL DATA:  [place] yesterday with left hip pain. EXAM: DG HIP (WITH OR WITHOUT PELVIS) 2-3V LEFT COMPARISON:  None. FINDINGS: There is a fracture of the left femoral neck with slight impaction. No intertrochanteric component. The bones of the pelvis are negative. IMPRESSION: Left femoral neck fracture with minimal impaction. Electronically Signed   By: Nelson Chimes M.D.   On: 03/27/2021 16:10   CT HEAD CODE STROKE WO CONTRAST  Result Date: 04/16/2021 CLINICAL DATA:  Code stroke.  Slurred speech, weakness, drooling EXAM: CT HEAD WITHOUT CONTRAST TECHNIQUE: Contiguous axial images were obtained from the base of the skull through the vertex without intravenous contrast. RADIATION DOSE REDUCTION: This exam was performed according to the departmental dose-optimization program which includes automated exposure control, adjustment of the mA and/or kV according to patient size and/or use of iterative reconstruction technique. COMPARISON:  CT/CTA head and neck 03/31/2021, MR head 03/30/2021 FINDINGS: Brain: There is no evidence of acute intracranial hemorrhage, extra-axial fluid collection, or acute infarct. Parenchymal volume is within normal limits  for age. The ventricles are stable in size. 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 Small remote infarcts are again seen in the bilateral basal ganglia and cerebellar hemispheres. Additional patchy hypodensity in the subcortical and periventricular white matter likely reflects sequela of moderate chronic white matter microangiopathy. There is no mass lesion.  There is no midline shift. Vascular: There is calcification of the bilateral cavernous ICAs. 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 Skull: Normal. Negative for fracture or focal lesion. Sinuses/Orbits: The imaged paranasal sinuses are clear. Bilateral lens implants are in place. The globes and orbits are otherwise unremarkable. Other: None. ASPECTS [facility] Stroke Program Early CT Score) - Ganglionic level infarction (caudate, lentiform nuclei, internal capsule, insula, M1-M3 cortex): 7 - Supraganglionic infarction (M4-M6 cortex): 3 Total score (0-10 with 10 being normal): 10 IMPRESSION: 1. No acute intracranial pathology. 2. ASPECTS is 10 These results were called by telephone at the time of interpretation on 04/16/2021 at 9:48 am to provider [facility] , who verbally acknowledged these results. Electronically Signed   By: Valetta Mole M.D.   On: 04/16/2021 09:51   CT ANGIO HEAD NECK W WO CM (CODE STROKE)  Result Date: 04/16/2021 CLINICAL DATA:  88 year old female code stroke presentation. Known left PICA aneurysm. EXAM: CT ANGIOGRAPHY HEAD AND NECK TECHNIQUE: Multidetector CT imaging of the head and neck was performed using the standard protocol during bolus administration of intravenous contrast. Multiplanar CT image reconstructions and MIPs were obtained to evaluate the vascular anatomy. Carotid stenosis measurements (when applicable) are obtained utilizing NASCET criteria, using the distal internal carotid diameter as the denominator. RADIATION DOSE REDUCTION: This exam was performed according to the departmental  dose-optimization program which includes automated exposure control,  adjustment of the mA and/or kV according to patient size and/or use of iterative reconstruction technique. CONTRAST:  58mL OMNIPAQUE IOHEXOL 350 MG/ML SOLN COMPARISON:  Plain head CT 0943 hours today. CTA head and neck 03/31/2021. FINDINGS: CTA NECK Skeleton: Torus palatini is, normal variant, and absent maxillary dentition again noted. 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 No acute osseous abnormality identified. Upper chest: Negative; Visible upper lobe pulmonary artery branches now appear grossly patent. Other neck: Heterogeneously enlarged right thyroid lobe with hypodense nodules individually about 14 mm, stable. Otherwise negative. Aortic arch: 3 vessel arch configuration. Calcified aortic atherosclerosis. Right carotid system: Brachiocephalic artery plaque without stenosis. Tortuous proximal right CCA. Calcified plaque at the right ICA origin and bulb is stable with less than 50 % stenosis with respect to the distal vessel. Left carotid system: Mild left CCA origin plaque without stenosis. Tortuous proximal left CCA. Stable calcified plaque at the left ICA origin without stenosis. Vertebral arteries: Stable proximal right subclavian artery calcified plaque without stenosis. Normal right vertebral artery origin. Right vertebral artery is patent to the skull base without stenosis. Mild contralateral left subclavian artery plaque without stenosis. Normal left vertebral artery origin. Tortuous left V1 segment. Left vertebral artery is patent to the skull base without stenosis. CTA HEAD Posterior circulation: Fairly codominant distal vertebral arteries are patent to the vertebrobasilar junction without stenosis. Fairly large, saccular, 8-9 mm aneurysm of the right vertebral artery V4 segment at or near the left PICA origin is stable on series 7, image 122. Normal right PICA origin. Patent basilar artery without stenosis.  Normal SCA and PCA origins. Posterior communicating arteries are diminutive or absent. Bilateral PCA branches are within normal limits. Anterior circulation: Both ICA siphons are patent mild to moderate siphon calcified plaque mostly in the cavernous and supraclinoid segments appears stable with only mild stenosis on the left. Normal ophthalmic artery origins. Patent carotid termini. Patent MCA and ACA origins. Diminutive or absent anterior communicating artery. Bilateral ACA branches are stable and within normal limits. Left MCA M1 segment and bifurcation are patent without stenosis. However, there is a left M3 branch occlusion well demonstrated on series 12, image 29, also on series 9, image 140 and series 7, image 80. This is in the middle sylvian division. Other left MCA branches appear stable. Right MCA M1 segment and trifurcation are patent without stenosis. Right MCA branches are within normal limits. Venous sinuses: Patent. Anatomic variants: None. Review of the MIP images confirms the above findings IMPRESSION: 1. Negative for LV0 but Positive for a new Left MCA M3 branch occlusion in the middle Sylvian division. 2. 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 3. Stable carotid andatherosclerosis without significant stenosis. Aortic Atherosclerosis (ICD10-I70.0). 4. Stable right thyroid Nodules. In the setting of significant comorbidities or limited life expectancy, no follow-up recommended (ref: J Am Coll Radiol. 2015 Feb;12(2): 143-50). Study discussed by telephone with Dr. Valora Piccolo on 04/16/2021 at 10:09 . Electronically Signed   By: Genevie Ann M.D.   On: 04/16/2021 10:11     Assessment and Recommendation  88 y.o. female with known hypertension hyperlipidemia and new onset acute middle cerebral artery stroke possibly embolic in phenomenon without evidence of primary cardiac source with current transesophageal echocardiogram but still other potential  sources including atherosclerosis and/or rhythm disturbances 1.  Continue dual antiplatelet therapy for the next several weeks and then to single antiplatelet therapy 2.  High intensity cholesterol therapy 3.  Hypertension control as needed  4.  Follow-up in 1 to 2 weeks for additional assessment including long-term monitor for potential atrial fibrillation 5.  If ambulating well okay for discharge home from cardiac standpoint  Signed, Serafina Royals M.D. FACC

## 2021-04-19 LAB — CARDIOLIPIN ANTIBODIES, IGG, IGM, IGA
Anticardiolipin IgA: <9 APL U/mL (ref 0–11)
Anticardiolipin IgG: <9 GPL U/mL (ref 0–14)
Anticardiolipin IgM: 10 MPL U/mL (ref 0–12)

## 2021-04-19 LAB — PROTEIN S, TOTAL: Protein S Ag, Total: 149 % (ref 60–150)

## 2021-04-19 LAB — LUPUS ANTICOAGULANT PANEL
DRVVT: 27.5 s (ref 0.0–47.0)
PTT Lupus Anticoagulant: 27.6 s (ref 0.0–43.5)

## 2021-04-19 LAB — PROTEIN C ACTIVITY: Protein C Activity: 142 % (ref 73–180)

## 2021-04-19 LAB — HOMOCYSTEINE: Homocysteine: 13.3 umol/L (ref 0.0–21.3)

## 2021-04-19 LAB — PROTEIN S ACTIVITY: Protein S Activity: 120 % (ref 63–140)

## 2021-04-20 LAB — BETA-2-GLYCOPROTEIN I ABS, IGG/M/A
Beta-2 Glyco I IgG: <9 GPI IgG units (ref 0–20)
Beta-2-Glycoprotein I IgA: <9 GPI IgA units (ref 0–25)
Beta-2-Glycoprotein I IgM: <9 GPI IgM units (ref 0–32)

## 2021-04-20 LAB — PROTEIN C, TOTAL: Protein C, Total: 104 % (ref 60–150)

## 2021-04-21 ENCOUNTER — Encounter: Payer: Self-pay | Admitting: Internal Medicine

## 2021-04-22 LAB — FACTOR 5 LEIDEN

## 2021-04-23 LAB — MISC LABCORP TEST (SEND OUT)
LabCorp test name: 505575
Labcorp test code: 505575

## 2021-04-24 LAB — PROTHROMBIN GENE MUTATION

## 2021-05-14 ENCOUNTER — Encounter: Payer: Self-pay | Admitting: Intensive Care

## 2021-05-14 ENCOUNTER — Emergency Department: Payer: Medicare Other

## 2021-05-14 ENCOUNTER — Emergency Department
Admission: EM | Admit: 2021-05-14 | Discharge: 2021-05-14 | Disposition: A | Discharge status: Home / Self Care | Payer: Medicare Other | Attending: Emergency Medicine | Admitting: Emergency Medicine

## 2021-05-14 ENCOUNTER — Other Ambulatory Visit: Payer: Self-pay

## 2021-05-14 DIAGNOSIS — T148XXA Other injury of unspecified body region, initial encounter: Secondary | ICD-10-CM

## 2021-05-14 DIAGNOSIS — Z8673 Personal history of transient ischemic attack (TIA), and cerebral infarction without residual deficits: Secondary | ICD-10-CM | POA: Insufficient documentation

## 2021-05-14 DIAGNOSIS — S0083XA Contusion of other part of head, initial encounter: Secondary | ICD-10-CM | POA: Insufficient documentation

## 2021-05-14 DIAGNOSIS — W19XXXA Unspecified fall, initial encounter: Secondary | ICD-10-CM

## 2021-05-14 DIAGNOSIS — K5900 Constipation, unspecified: Secondary | ICD-10-CM | POA: Insufficient documentation

## 2021-05-14 DIAGNOSIS — W500XXA Accidental hit or strike by another person, initial encounter: Secondary | ICD-10-CM | POA: Insufficient documentation

## 2021-05-14 DIAGNOSIS — S7001XA Contusion of right hip, initial encounter: Secondary | ICD-10-CM | POA: Insufficient documentation

## 2021-05-14 DIAGNOSIS — S0990XA Unspecified injury of head, initial encounter: Secondary | ICD-10-CM | POA: Diagnosis present

## 2021-05-14 DIAGNOSIS — R54 Age-related physical debility: Secondary | ICD-10-CM | POA: Insufficient documentation

## 2021-05-14 LAB — BASIC METABOLIC PANEL
Anion gap: 8 (ref 5–15)
BUN: 15 mg/dL (ref 8–23)
CO2: 25 mmol/L (ref 22–32)
Calcium: 9.9 mg/dL (ref 8.9–10.3)
Chloride: 104 mmol/L (ref 98–111)
Creatinine, Ser: 1.05 mg/dL — ABNORMAL HIGH (ref 0.44–1.00)
GFR, Estimated: 52 mL/min — ABNORMAL LOW (ref >60)
Glucose, Bld: 111 mg/dL — ABNORMAL HIGH (ref 70–99)
Potassium: 3.7 mmol/L (ref 3.5–5.1)
Sodium: 137 mmol/L (ref 135–145)

## 2021-05-14 LAB — CBC
HCT: 34.3 % — ABNORMAL LOW (ref 36.0–46.0)
Hemoglobin: 11.2 g/dL — ABNORMAL LOW (ref 12.0–15.0)
MCH: 31.7 pg (ref 26.0–34.0)
MCHC: 32.7 g/dL (ref 30.0–36.0)
MCV: 97.2 fL (ref 80.0–100.0)
Platelets: 333 10*3/uL (ref 150–400)
RBC: 3.53 MIL/uL — ABNORMAL LOW (ref 3.87–5.11)
RDW: 13.8 % (ref 11.5–15.5)
WBC: 9 10*3/uL (ref 4.0–10.5)
nRBC: 0 % (ref 0.0–0.2)
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
## 2021-09-08 DIAGNOSIS — R54 Age-related physical debility: Secondary | ICD-10-CM | POA: Diagnosis not present

## 2021-09-08 DIAGNOSIS — R42 Dizziness and giddiness: Secondary | ICD-10-CM | POA: Diagnosis not present

## 2021-09-08 DIAGNOSIS — R739 Hyperglycemia, unspecified: Secondary | ICD-10-CM | POA: Diagnosis not present

## 2021-09-08 DIAGNOSIS — N1832 Chronic kidney disease, stage 3b: Secondary | ICD-10-CM | POA: Diagnosis not present

## 2021-09-30 DIAGNOSIS — F331 Major depressive disorder, recurrent, moderate: Secondary | ICD-10-CM | POA: Diagnosis not present

## 2021-09-30 DIAGNOSIS — F419 Anxiety disorder, unspecified: Secondary | ICD-10-CM | POA: Diagnosis not present

## 2021-10-06 DIAGNOSIS — R42 Dizziness and giddiness: Secondary | ICD-10-CM | POA: Diagnosis not present

## 2021-10-06 DIAGNOSIS — J301 Allergic rhinitis due to pollen: Secondary | ICD-10-CM | POA: Diagnosis not present

## 2021-10-06 DIAGNOSIS — N1832 Chronic kidney disease, stage 3b: Secondary | ICD-10-CM | POA: Diagnosis not present

## 2021-10-06 DIAGNOSIS — R739 Hyperglycemia, unspecified: Secondary | ICD-10-CM | POA: Diagnosis not present

## 2021-11-10 DIAGNOSIS — F331 Major depressive disorder, recurrent, moderate: Secondary | ICD-10-CM | POA: Diagnosis not present

## 2021-11-10 DIAGNOSIS — R54 Age-related physical debility: Secondary | ICD-10-CM | POA: Diagnosis not present

## 2021-11-10 DIAGNOSIS — J301 Allergic rhinitis due to pollen: Secondary | ICD-10-CM | POA: Diagnosis not present

## 2021-11-13 DIAGNOSIS — R011 Cardiac murmur, unspecified: Secondary | ICD-10-CM | POA: Diagnosis not present

## 2021-11-13 DIAGNOSIS — K2100 Gastro-esophageal reflux disease with esophagitis, without bleeding: Secondary | ICD-10-CM | POA: Insufficient documentation

## 2021-11-13 DIAGNOSIS — I700 Atherosclerosis of aorta: Secondary | ICD-10-CM | POA: Diagnosis not present

## 2021-11-13 DIAGNOSIS — I10 Essential (primary) hypertension: Secondary | ICD-10-CM | POA: Diagnosis not present

## 2021-11-13 DIAGNOSIS — F331 Major depressive disorder, recurrent, moderate: Secondary | ICD-10-CM | POA: Insufficient documentation

## 2021-11-13 DIAGNOSIS — J301 Allergic rhinitis due to pollen: Secondary | ICD-10-CM | POA: Insufficient documentation

## 2021-12-11 DIAGNOSIS — J301 Allergic rhinitis due to pollen: Secondary | ICD-10-CM | POA: Diagnosis not present

## 2021-12-11 DIAGNOSIS — F331 Major depressive disorder, recurrent, moderate: Secondary | ICD-10-CM | POA: Diagnosis not present

## 2021-12-11 DIAGNOSIS — R54 Age-related physical debility: Secondary | ICD-10-CM | POA: Diagnosis not present

## 2022-01-12 DIAGNOSIS — J301 Allergic rhinitis due to pollen: Secondary | ICD-10-CM | POA: Diagnosis not present

## 2022-01-12 DIAGNOSIS — H401134 Primary open-angle glaucoma, bilateral, indeterminate stage: Secondary | ICD-10-CM | POA: Diagnosis not present

## 2022-01-12 DIAGNOSIS — I10 Essential (primary) hypertension: Secondary | ICD-10-CM | POA: Diagnosis not present

## 2022-01-12 DIAGNOSIS — F419 Anxiety disorder, unspecified: Secondary | ICD-10-CM | POA: Diagnosis not present

## 2022-01-12 DIAGNOSIS — R519 Headache, unspecified: Secondary | ICD-10-CM | POA: Diagnosis not present

## 2022-01-15 ENCOUNTER — Ambulatory Visit: Payer: Medicare Other | Admitting: Dermatology

## 2022-01-15 DIAGNOSIS — L570 Actinic keratosis: Secondary | ICD-10-CM

## 2022-01-15 DIAGNOSIS — Z79899 Other long term (current) drug therapy: Secondary | ICD-10-CM | POA: Diagnosis not present

## 2022-01-15 DIAGNOSIS — L821 Other seborrheic keratosis: Secondary | ICD-10-CM

## 2022-01-15 DIAGNOSIS — L820 Inflamed seborrheic keratosis: Secondary | ICD-10-CM

## 2022-01-15 DIAGNOSIS — L578 Other skin changes due to chronic exposure to nonionizing radiation: Secondary | ICD-10-CM | POA: Diagnosis not present

## 2022-01-15 DIAGNOSIS — L219 Seborrheic dermatitis, unspecified: Secondary | ICD-10-CM

## 2022-01-15 MED ORDER — KETOCONAZOLE 2 % EX SHAM: MEDICATED_SHAMPOO | CUTANEOUS | 11 refills | Status: DC

## 2022-01-15 NOTE — Progress Notes (Signed)
New Patient Visit  Subjective  Chelsea Hart is a 88 y.o. female who presents for the following: New Patient (Initial Visit) (Patient here concerning itchy scalp and spot at right forearm. ). The patient has spots, moles and lesions to be evaluated, some may be new or changing and the patient has concerns that these could be cancer.  The following portions of the chart were reviewed this encounter and updated as appropriate:   Tobacco  Allergies  Meds  Problems  Med Hx  Surg Hx  Fam Hx     Review of Systems:  No other skin or systemic complaints except as noted in HPI or Assessment and Plan.  Objective  Well appearing patient in no apparent distress; mood and affect are within normal limits.  A focused examination was performed including face, right arm, scalp. Relevant physical exam findings are noted in the Assessment and Plan.  Scalp Scale throughout scalp   right cheek x 1 Erythematous thin papules/macules with gritty scale.   right bicep near antecubital area x 1 Erythematous stuck-on, waxy papule or plaque   Assessment & Plan  Seborrheic dermatitis Scalp With pruritus  Seborrheic Dermatitis  -  is a chronic persistent rash characterized by pinkness and scaling most commonly of the mid face but also can occur on the scalp (dandruff), ears; mid chest, mid back and groin.  It tends to be exacerbated by stress and cooler weather.  People who have neurologic disease may experience new onset or exacerbation of existing seborrheic dermatitis.  The condition is not curable but treatable and can be controlled.  Start Ketoconazole shampoo apply three times per week, massage into scalp and leave in for 10 minutes before rinsing out  If patient is not improving at next follow up will consider adding topical steroid.   ketoconazole (NIZORAL) 2 % shampoo - Scalp apply three times per week, massage into scalp and leave in for 10 minutes before rinsing out  Actinic  keratosis right cheek x 1 Actinic keratoses are precancerous spots that appear secondary to cumulative UV radiation exposure/sun exposure over time. They are chronic with expected duration over 1 year. A portion of actinic keratoses will progress to squamous cell carcinoma of the skin. It is not possible to reliably predict which spots will progress to skin cancer and so treatment is recommended to prevent development of skin cancer.  Recommend daily broad spectrum sunscreen SPF 30+ to sun-exposed areas, reapply every 2 hours as needed.  Recommend staying in the shade or wearing long sleeves, sun glasses (UVA+UVB protection) and wide brim hats (4-inch brim around the entire circumference of the hat). Call for new or changing lesions.  Destruction of lesion - right cheek x 1 Complexity: simple   Destruction method: cryotherapy   Informed consent: discussed and consent obtained   Timeout:  patient name, date of birth, surgical site, and procedure verified Lesion destroyed using liquid nitrogen: Yes   Region frozen until ice ball extended beyond lesion: Yes   Outcome: patient tolerated procedure well with no complications   Post-procedure details: wound care instructions given   Additional details:  Prior to procedure, discussed risks of blister formation, small wound, skin dyspigmentation, or rare scar following cryotherapy. Recommend Vaseline ointment to treated areas while healing.   Inflamed seborrheic keratosis right bicep near antecubital area x 1 Symptomatic, irritating, patient would like treated. Reevaluate area at next follow up Destruction of lesion - right bicep near antecubital area x 1 Complexity: simple  Destruction method: cryotherapy   Informed consent: discussed and consent obtained   Timeout:  patient name, date of birth, surgical site, and procedure verified Lesion destroyed using liquid nitrogen: Yes   Region frozen until ice ball extended beyond lesion: Yes    Outcome: patient tolerated procedure well with no complications   Post-procedure details: wound care instructions given   Additional details:  Prior to procedure, discussed risks of blister formation, small wound, skin dyspigmentation, or rare scar following cryotherapy. Recommend Vaseline ointment to treated areas while healing.  Actinic Damage - chronic, secondary to cumulative UV radiation exposure/sun exposure over time - diffuse scaly erythematous macules with underlying dyspigmentation - Recommend daily broad spectrum sunscreen SPF 30+ to sun-exposed areas, reapply every 2 hours as needed.  - Recommend staying in the shade or wearing long sleeves, sun glasses (UVA+UVB protection) and wide brim hats (4-inch brim around the entire circumference of the hat). - Call for new or changing lesions.  Seborrheic Keratoses - Stuck-on, waxy, tan-brown papules and/or plaques  - Benign-appearing - Discussed benign etiology and prognosis. - Observe - Call for any changes  Return in about 3 months (around 04/17/2022) for ak/isk and seb derm follow up.  IRuthell Rummage, CMA, am acting as scribe for Sarina Ser, MD. Documentation: I have reviewed the above documentation for accuracy and completeness, and I agree with the above.  Sarina Ser, MD

## 2022-01-15 NOTE — Patient Instructions (Signed)
For itchy scalp   Start ketoconazole shampoo  apply three times per week, massage into scalp and leave in for 10 minutes before rinsing out   Seborrheic Keratosis  What causes seborrheic keratoses? Seborrheic keratoses are harmless, common skin growths that first appear during adult life.  As time goes by, more growths appear.  Some people may develop a large number of them.  Seborrheic keratoses appear on both covered and uncovered body parts.  They are not caused by sunlight.  The tendency to develop seborrheic keratoses can be inherited.  They vary in color from skin-colored to gray, brown, or even black.  They can be either smooth or have a rough, warty surface.   Seborrheic keratoses are superficial and look as if they were stuck on the skin.  Under the microscope this type of keratosis looks like layers upon layers of skin.  That is why at times the top layer may seem to fall off, but the rest of the growth remains and re-grows.    Treatment Seborrheic keratoses do not need to be treated, but can easily be removed in the office.  Seborrheic keratoses often cause symptoms when they rub on clothing or jewelry.  Lesions can be in the way of shaving.  If they become inflamed, they can cause itching, soreness, or burning.  Removal of a seborrheic keratosis can be accomplished by freezing, burning, or surgery. If any spot bleeds, scabs, or grows rapidly, please return to have it checked, as these can be an indication of a skin cancer.   Cryotherapy Aftercare  Wash gently with soap and water everyday.   Apply Vaseline and Band-Aid daily until healed.   Actinic keratoses are precancerous spots that appear secondary to cumulative UV radiation exposure/sun exposure over time. They are chronic with expected duration over 1 year. A portion of actinic keratoses will progress to squamous cell carcinoma of the skin. It is not possible to reliably predict which spots will progress to skin cancer and so  treatment is recommended to prevent development of skin cancer.  Recommend daily broad spectrum sunscreen SPF 30+ to sun-exposed areas, reapply every 2 hours as needed.  Recommend staying in the shade or wearing long sleeves, sun glasses (UVA+UVB protection) and wide brim hats (4-inch brim around the entire circumference of the hat). Call for new or changing lesions.     Due to recent changes in healthcare laws, you may see results of your pathology and/or laboratory studies on MyChart before the doctors have had a chance to review them. We understand that in some cases there may be results that are confusing or concerning to you. Please understand that not all results are received at the same time and often the doctors may need to interpret multiple results in order to provide you with the best plan of care or course of treatment. Therefore, we ask that you please give [place] 2 business days to thoroughly review all your results before contacting the office for clarification. Should we see a critical lab result, you will be contacted sooner.   If You Need Anything After Your Visit  If you have any questions or concerns for your doctor, please call our main line at [phone] and press option 4 to reach your doctor's medical assistant. If no one answers, please leave a voicemail as directed and we will return your call as soon as possible. Messages left after 4 pm will be answered the following business day.   You may also send  [place] a message via [place]. We typically respond to MyChart messages within 1-2 business days.  For prescription refills, please ask your pharmacy to contact our office. Our fax number is [phone].  If you have an urgent issue when the clinic is closed that cannot wait until the next business day, you can page your doctor at the number below.    Please note that while we do our best to be available for urgent issues outside of office hours, we are not available 24/7.    If you have an urgent issue and are unable to reach [place], you may choose to seek medical care at your doctor's office, retail clinic, urgent care center, or emergency room.  If you have a medical emergency, please immediately call 911 or go to the emergency department.  Pager Numbers  - Dr. Nehemiah Massed: [phone]  - Dr. Laurence Ferrari: [phone]  - Dr. Nicole Kindred: [phone]  In the event of inclement weather, please call our main line at [phone] for an update on the status of any delays or closures.  Dermatology Medication Tips: Please keep the boxes that topical medications come in in order to help keep track of the instructions about where and how to use these. Pharmacies typically print the medication instructions only on the boxes and not directly on the medication tubes.   If your medication is too expensive, please contact our office at [phone] option 4 or send [place] a message through [place].   We are unable to tell what your co-pay for medications will be in advance as this is different depending on your insurance coverage. However, we may be able to find a substitute medication at lower cost or fill out paperwork to get insurance to cover a needed medication.   If a prior authorization is required to get your medication covered by your insurance company, please allow [place] 1-2 business days to complete this process.  Drug prices often vary depending on where the prescription is filled and some pharmacies may offer cheaper prices.  The website www.goodrx.com contains coupons for medications through different pharmacies. The prices here do not account for what the cost may be with help from insurance (it may be cheaper with your insurance), but the website can give you the price if you did not use any insurance.  - You can print the associated coupon and take it with your prescription to the pharmacy.  - You may also stop by our office during regular business hours and pick up a  GoodRx coupon card.  - If you need your prescription sent electronically to a different pharmacy, notify our office through [facility] or by phone at [phone] option 4.     Si Usted Necesita Algo Despus de Su Visita  Tambin puede enviarnos un mensaje a travs de Pharmacist, community. Por lo general respondemos a los mensajes de MyChart en el transcurso de 1 a 2 das hbiles.  Para renovar recetas, por favor pida a su farmacia que se ponga en contacto con nuestra oficina. Harland Dingwall de fax es [place] [phone].  Si tiene un asunto urgente cuando la clnica est cerrada y que no puede esperar hasta el siguiente da hbil, puede llamar/localizar a su doctor(a) al nmero que aparece a continuacin.   Por favor, tenga en cuenta que aunque hacemos todo lo posible para estar disponibles para asuntos urgentes fuera del horario de [place], no estamos disponibles las 24 horas del da, los 7 das de la [place].   Si tiene un  problema urgente y no puede comunicarse con nosotros, puede optar por buscar atencin mdica  en el consultorio de su doctor(a), en una clnica privada, en un centro de atencin urgente o en una sala de emergencias.  Si tiene Engineering geologist, por favor llame inmediatamente al 911 o vaya a la sala de emergencias.  Nmeros de bper  - Dr. Nehemiah Massed: [phone]  - Dra. Moye: [phone]  - Dra. Nicole Kindred: [phone]  En caso de inclemencias del [place], por favor llame a Johnsie Kindred principal al [phone] para una actualizacin sobre el [place] de cualquier retraso o cierre.  Consejos para la medicacin en dermatologa: Por favor, guarde las cajas en las que vienen los medicamentos de uso tpico para ayudarle a seguir las instrucciones sobre dnde y cmo usarlos. Las farmacias generalmente imprimen las instrucciones del medicamento slo en las cajas y no directamente en los tubos del [place].   Si su medicamento es muy caro, por favor, pngase en contacto con  Zigmund Daniel llamando al [phone] y presione la opcin 4 o envenos un mensaje a travs de Pharmacist, community.   No podemos decirle cul ser su copago por los medicamentos por adelantado ya que esto es diferente dependiendo de la cobertura de su seguro. Sin embargo, es posible que podamos encontrar un medicamento sustituto a Electrical engineer un formulario para que el seguro cubra el medicamento que se considera necesario.   Si se requiere una autorizacin previa para que su compaa de seguros [place] su medicamento, por favor permtanos de 1 a 2 das hbiles para completar este proceso.  Los precios de los medicamentos varan con frecuencia dependiendo del Environmental consultant de dnde se surte la receta y alguna farmacias pueden ofrecer precios ms baratos.  El sitio web www.goodrx.com tiene cupones para medicamentos de Airline pilot. Los precios aqu no tienen en cuenta lo que podra costar con la ayuda del seguro (puede ser ms barato con su seguro), pero el sitio web puede darle el precio si no utiliz Research scientist (physical sciences).  - Puede imprimir el cupn correspondiente y llevarlo con su receta a la farmacia.  - Tambin puede pasar por nuestra oficina durante el horario de atencin regular y Charity fundraiser una tarjeta de cupones de GoodRx.  - Si necesita que su receta se enve electrnicamente a una farmacia diferente, informe a nuestra oficina a travs de MyChart de [place] o por telfono llamando al [phone] y presione la opcin 4.

## 2022-01-19 DIAGNOSIS — H401121 Primary open-angle glaucoma, left eye, mild stage: Secondary | ICD-10-CM | POA: Diagnosis not present

## 2022-01-29 DIAGNOSIS — I10 Essential (primary) hypertension: Secondary | ICD-10-CM | POA: Diagnosis not present

## 2022-01-29 DIAGNOSIS — R519 Headache, unspecified: Secondary | ICD-10-CM | POA: Diagnosis not present

## 2022-02-03 ENCOUNTER — Encounter: Payer: Self-pay | Admitting: Dermatology

## 2022-03-12 DIAGNOSIS — I10 Essential (primary) hypertension: Secondary | ICD-10-CM | POA: Diagnosis not present

## 2022-03-12 DIAGNOSIS — R030 Elevated blood-pressure reading, without diagnosis of hypertension: Secondary | ICD-10-CM | POA: Diagnosis not present

## 2022-03-20 DIAGNOSIS — J069 Acute upper respiratory infection, unspecified: Secondary | ICD-10-CM | POA: Diagnosis not present

## 2022-03-20 DIAGNOSIS — I10 Essential (primary) hypertension: Secondary | ICD-10-CM | POA: Diagnosis not present

## 2022-03-24 DIAGNOSIS — I10 Essential (primary) hypertension: Secondary | ICD-10-CM | POA: Diagnosis not present

## 2022-04-09 DIAGNOSIS — I10 Essential (primary) hypertension: Secondary | ICD-10-CM | POA: Diagnosis not present

## 2022-04-23 ENCOUNTER — Ambulatory Visit: Payer: Medicare Other | Admitting: Dermatology

## 2022-04-23 VITALS — BP 178/88

## 2022-04-23 DIAGNOSIS — L578 Other skin changes due to chronic exposure to nonionizing radiation: Secondary | ICD-10-CM

## 2022-04-23 DIAGNOSIS — Z79899 Other long term (current) drug therapy: Secondary | ICD-10-CM

## 2022-04-23 DIAGNOSIS — L219 Seborrheic dermatitis, unspecified: Secondary | ICD-10-CM

## 2022-04-23 DIAGNOSIS — Z872 Personal history of diseases of the skin and subcutaneous tissue: Secondary | ICD-10-CM

## 2022-04-23 MED ORDER — CLOBETASOL PROPIONATE 0.05 % EX SOLN: 1.0000 | CUTANEOUS | 2 refills | Status: DC

## 2022-04-23 NOTE — Progress Notes (Signed)
   Follow-Up Visit   Subjective  Chelsea Hart is a 89 y.o. female who presents for the following: Actinic Keratosis (Right cheek treated with LN2) and Follow-up (ISK follow up of right bicep near antecubital treated with LN2). The patient has spots, moles and lesions to be evaluated, some may be new or changing and the patient has concerns that these could be cancer.  The following portions of the chart were reviewed this encounter and updated as appropriate:   Tobacco  Allergies  Meds  Problems  Med Hx  Surg Hx  Fam Hx     Review of Systems:  No other skin or systemic complaints except as noted in HPI or Assessment and Plan.  Objective  Well appearing patient in no apparent distress; mood and affect are within normal limits.  A focused examination was performed including face, scalp,right arm. Relevant physical exam findings are noted in the Assessment and Plan.  Right cheek Clear   Assessment & Plan  Seborrheic dermatitis Scalp Seborrheic Dermatitis  -  is a chronic persistent rash characterized by pinkness and scaling most commonly of the mid face but also can occur on the scalp (dandruff), ears; mid chest, mid back and groin.  It tends to be exacerbated by stress and cooler weather.  People who have neurologic disease may experience new onset or exacerbation of existing seborrheic dermatitis.  The condition is not curable but treatable and can be controlled.  Continue Ketoconazole 2% shampoo 3 times per week. Start Clobetasol solution 3 times per week after shampooing (before or after drying hair)  Recommend starting Claritin or Allegra 1 or 2 tablets daily  clobetasol (TEMOVATE) 0.05 % external solution - Scalp Apply 1 Application topically as directed. 3 times per week after washing hair  Related Medications ketoconazole (NIZORAL) 2 % shampoo apply three times per week, massage into scalp and leave in for 10 minutes before rinsing out  History of actinic  keratoses Right cheek Clear. Observe for recurrence. Call clinic for new or changing lesions.  Recommend regular skin exams, daily broad-spectrum spf 30+ sunscreen use, and photoprotection.    Actinic Damage - chronic, secondary to cumulative UV radiation exposure/sun exposure over time - diffuse scaly erythematous macules with underlying dyspigmentation - Recommend daily broad spectrum sunscreen SPF 30+ to sun-exposed areas, reapply every 2 hours as needed.  - Recommend staying in the shade or wearing long sleeves, sun glasses (UVA+UVB protection) and wide brim hats (4-inch brim around the entire circumference of the hat). - Call for new or changing lesions.  Return in about 2 months (around 06/22/2022) for seb derm follow up.  I, Ashok Cordia, CMA, am acting as scribe for Sarina Ser, MD . Documentation: I have reviewed the above documentation for accuracy and completeness, and I agree with the above.  Sarina Ser, MD

## 2022-04-23 NOTE — Patient Instructions (Signed)
Seborrheic Dermatitis  -  is a chronic persistent rash characterized by pinkness and scaling most commonly of the mid face but also can occur on the scalp (dandruff), ears; mid chest, mid back and groin.  It tends to be exacerbated by stress and cooler weather.  People who have neurologic disease may experience new onset or exacerbation of existing seborrheic dermatitis.  The condition is not curable but treatable and can be controlled.  Continue Ketoconazole 2% shampoo 3 times per week. Start Clobetasol solution 3 times per week after shampooing (before or after drying hair)  Recommend starting Claritin or Allegra 1 or 2 tablets daily    Due to recent changes in healthcare laws, you may see results of your pathology and/or laboratory studies on MyChart before the doctors have had a chance to review them. We understand that in some cases there may be results that are confusing or concerning to you. Please understand that not all results are received at the same time and often the doctors may need to interpret multiple results in order to provide you with the best plan of care or course of treatment. Therefore, we ask that you please give [place] 2 business days to thoroughly review all your results before contacting the office for clarification. Should we see a critical lab result, you will be contacted sooner.   If You Need Anything After Your Visit  If you have any questions or concerns for your doctor, please call our main line at [phone] and press option 4 to reach your doctor's medical assistant. If no one answers, please leave a voicemail as directed and we will return your call as soon as possible. Messages left after 4 pm will be answered the following business day.   You may also send [place] a message via [place]. We typically respond to MyChart messages within 1-2 business days.  For prescription refills, please ask your pharmacy to contact our office. Our fax number is [phone].  If  you have an urgent issue when the clinic is closed that cannot wait until the next business day, you can page your doctor at the number below.    Please note that while we do our best to be available for urgent issues outside of office hours, we are not available 24/7.   If you have an urgent issue and are unable to reach [place], you may choose to seek medical care at your doctor's office, retail clinic, urgent care center, or emergency room.  If you have a medical emergency, please immediately call 911 or go to the emergency department.  Pager Numbers  - Dr. Nehemiah Massed: [phone]  - Dr. Laurence Ferrari: [phone]  - Dr. Nicole Kindred: [phone]  In the event of inclement weather, please call our main line at [phone] for an update on the status of any delays or closures.  Dermatology Medication Tips: Please keep the boxes that topical medications come in in order to help keep track of the instructions about where and how to use these. Pharmacies typically print the medication instructions only on the boxes and not directly on the medication tubes.   If your medication is too expensive, please contact our office at [phone] option 4 or send [place] a message through [place].   We are unable to tell what your co-pay for medications will be in advance as this is different depending on your insurance coverage. However, we may be able to find a substitute medication at lower cost or fill out paperwork to get insurance  to cover a needed medication.   If a prior authorization is required to get your medication covered by your insurance company, please allow [place] 1-2 business days to complete this process.  Drug prices often vary depending on where the prescription is filled and some pharmacies may offer cheaper prices.  The website www.goodrx.com contains coupons for medications through different pharmacies. The prices here do not account for what the cost may be with help from insurance (it may be cheaper  with your insurance), but the website can give you the price if you did not use any insurance.  - You can print the associated coupon and take it with your prescription to the pharmacy.  - You may also stop by our office during regular business hours and pick up a GoodRx coupon card.  - If you need your prescription sent electronically to a different pharmacy, notify our office through [facility] or by phone at [phone] option 4.     Si Usted Necesita Algo Despus de Su Visita  Tambin puede enviarnos un mensaje a travs de Pharmacist, community. Por lo general respondemos a los mensajes de MyChart en el transcurso de 1 a 2 das hbiles.  Para renovar recetas, por favor pida a su farmacia que se ponga en contacto con nuestra oficina. Harland Dingwall de fax es [place] [phone].  Si tiene un asunto urgente cuando la clnica est cerrada y que no puede esperar hasta el siguiente da hbil, puede llamar/localizar a su doctor(a) al nmero que aparece a continuacin.   Por favor, tenga en cuenta que aunque hacemos todo lo posible para estar disponibles para asuntos urgentes fuera del horario de [place], no estamos disponibles las 24 horas del da, los 7 das de la [place].   Si tiene un problema urgente y no puede comunicarse con nosotros, puede optar por buscar atencin mdica  en el consultorio de su doctor(a), en una clnica privada, en un centro de atencin urgente o en una sala de emergencias.  Si tiene Engineering geologist, por favor llame inmediatamente al 911 o vaya a la sala de emergencias.  Nmeros de bper  - Dr. Nehemiah Massed: [phone]  - Dra. Moye: [phone]  - Dra. Nicole Kindred: [phone]  En caso de inclemencias del [place], por favor llame a Johnsie Kindred principal al [phone] para una actualizacin sobre el [place] de cualquier retraso o cierre.  Consejos para la medicacin en dermatologa: Por favor, guarde las cajas en las que vienen los medicamentos de uso tpico para  ayudarle a seguir las instrucciones sobre dnde y cmo usarlos. Las farmacias generalmente imprimen las instrucciones del medicamento slo en las cajas y no directamente en los tubos del [place].   Si su medicamento es muy caro, por favor, pngase en contacto con Zigmund Daniel llamando al [phone] y presione la opcin 4 o envenos un mensaje a travs de Pharmacist, community.   No podemos decirle cul ser su copago por los medicamentos por adelantado ya que esto es diferente dependiendo de la cobertura de su seguro. Sin embargo, es posible que podamos encontrar un medicamento sustituto a Electrical engineer un formulario para que el seguro cubra el medicamento que se considera necesario.   Si se requiere una autorizacin previa para que su compaa de seguros [place] su medicamento, por favor permtanos de 1 a 2 das hbiles para completar este proceso.  Los precios de los medicamentos varan con frecuencia dependiendo del Environmental consultant de dnde se surte la receta y alguna farmacias pueden ofrecer precios  ms baratos.  El sitio web www.goodrx.com tiene cupones para medicamentos de Airline pilot. Los precios aqu no tienen en cuenta lo que podra costar con la ayuda del seguro (puede ser ms barato con su seguro), pero el sitio web puede darle el precio si no utiliz Research scientist (physical sciences).  - Puede imprimir el cupn correspondiente y llevarlo con su receta a la farmacia.  - Tambin puede pasar por nuestra oficina durante el horario de atencin regular y Charity fundraiser una tarjeta de cupones de GoodRx.  - Si necesita que su receta se enve electrnicamente a una farmacia diferente, informe a nuestra oficina a travs de MyChart de [place] o por telfono llamando al [phone] y presione la opcin 4.

## 2022-04-30 DIAGNOSIS — I10 Essential (primary) hypertension: Secondary | ICD-10-CM | POA: Diagnosis not present

## 2022-05-01 ENCOUNTER — Encounter: Payer: Self-pay | Admitting: Dermatology

## 2022-05-22 DIAGNOSIS — E7800 Pure hypercholesterolemia, unspecified: Secondary | ICD-10-CM | POA: Diagnosis not present

## 2022-05-22 DIAGNOSIS — F331 Major depressive disorder, recurrent, moderate: Secondary | ICD-10-CM | POA: Diagnosis not present

## 2022-05-22 DIAGNOSIS — H409 Unspecified glaucoma: Secondary | ICD-10-CM | POA: Diagnosis not present

## 2022-05-22 DIAGNOSIS — I10 Essential (primary) hypertension: Secondary | ICD-10-CM | POA: Diagnosis not present

## 2022-05-25 DIAGNOSIS — I10 Essential (primary) hypertension: Secondary | ICD-10-CM | POA: Diagnosis not present

## 2022-06-11 DIAGNOSIS — I10 Essential (primary) hypertension: Secondary | ICD-10-CM | POA: Diagnosis not present

## 2022-06-11 DIAGNOSIS — I129 Hypertensive chronic kidney disease with stage 1 through stage 4 chronic kidney disease, or unspecified chronic kidney disease: Secondary | ICD-10-CM | POA: Diagnosis not present

## 2022-06-11 DIAGNOSIS — N1832 Chronic kidney disease, stage 3b: Secondary | ICD-10-CM | POA: Diagnosis not present

## 2022-06-19 DIAGNOSIS — I10 Essential (primary) hypertension: Secondary | ICD-10-CM | POA: Diagnosis not present

## 2022-06-24 ENCOUNTER — Ambulatory Visit: Payer: Medicare Other | Admitting: Dermatology

## 2022-06-24 VITALS — BP 141/82

## 2022-06-24 DIAGNOSIS — L219 Seborrheic dermatitis, unspecified: Secondary | ICD-10-CM

## 2022-06-24 DIAGNOSIS — L820 Inflamed seborrheic keratosis: Secondary | ICD-10-CM | POA: Diagnosis not present

## 2022-06-24 DIAGNOSIS — Z79899 Other long term (current) drug therapy: Secondary | ICD-10-CM | POA: Diagnosis not present

## 2022-06-24 NOTE — Progress Notes (Signed)
   Follow-Up Visit   Subjective  Chelsea Hart is a 89 y.o. female who presents for the following: Seb derm f/u, scalp, Ketoconazole 2% shampoo 3x/wk, Clobetasol sol 3x/wk, itching improved, pt feels like thick scale forehead, scalp  The following portions of the chart were reviewed this encounter and updated as appropriate: medications, allergies, medical history  Review of Systems:  No other skin or systemic complaints except as noted in HPI or Assessment and Plan.  Objective  Well appearing patient in no apparent distress; mood and affect are within normal limits.  A focused examination was performed of the following areas: Face, scalp  Relevant exam findings are noted in the Assessment and Plan.  L forehead x 1 Stuck on waxy paps with erythema   Assessment & Plan   SEBORRHEIC DERMATITIS With Pruritis Exam: Scalp clear Chronic condition with duration or expected duration over one year. Currently well-controlled. Seborrheic Dermatitis is a chronic persistent rash characterized by pinkness and scaling most commonly of the mid face but also can occur on the scalp (dandruff), ears; mid chest, mid back and groin.  It tends to be exacerbated by stress and cooler weather.  People who have neurologic disease may experience new onset or exacerbation of existing seborrheic dermatitis.  The condition is not curable but treatable and can be controlled.  Treatment Plan: Cont Ketoconazole 2% shampoo 1-3x/wk, let sit 10 minutes and rinse out Cont Clobetasol sol may increase up to 4d/wk aa scalp Start Claritin 10mg  1 pill a day Cont Benadryl qhs  Topical steroids (such as triamcinolone, fluocinolone, fluocinonide, mometasone, clobetasol, halobetasol, betamethasone, hydrocortisone) can cause thinning and lightening of the skin if they are used for too long in the same area. Your physician has selected the right strength medicine for your problem and area affected on the body. Please use your  medication only as directed by your physician to prevent side effects.     Inflamed seborrheic keratosis L forehead x 1  Symptomatic, irritating, patient would like treated.   Destruction of lesion - L forehead x 1 Complexity: simple   Destruction method: cryotherapy   Informed consent: discussed and consent obtained   Timeout:  patient name, date of birth, surgical site, and procedure verified Lesion destroyed using liquid nitrogen: Yes   Region frozen until ice ball extended beyond lesion: Yes   Outcome: patient tolerated procedure well with no complications   Post-procedure details: wound care instructions given     Return in about 6 months (around 12/24/2022) for seb derm.  I, Ardis Rowan, RMA, am acting as scribe for Armida Sans, MD .  Documentation: I have reviewed the above documentation for accuracy and completeness, and I agree with the above.  Armida Sans, MD

## 2022-06-24 NOTE — Patient Instructions (Addendum)
Continue Ketoconazole 2% shampoo at least 1 a week, let sit 10 minutes and rinse out  May increase the Clobetasol solution to 4 days a week affected areas of itchy scalp, avoid using Clobetasol on face, under arms or in groin  Start Claritin  1 pill a day Change Benadryl to taking at bedtime  Cryotherapy Aftercare  Wash gently with soap and water everyday.   Apply Vaseline and Band-Aid daily until healed.    Due to recent changes in healthcare laws, you may see results of your pathology and/or laboratory studies on MyChart before the doctors have had a chance to review them. We understand that in some cases there may be results that are confusing or concerning to you. Please understand that not all results are received at the same time and often the doctors may need to interpret multiple results in order to provide you with the best plan of care or course of treatment. Therefore, we ask that you please give [place] 2 business days to thoroughly review all your results before contacting the office for clarification. Should we see a critical lab result, you will be contacted sooner.   If You Need Anything After Your Visit  If you have any questions or concerns for your doctor, please call our main line at [phone] and press option 4 to reach your doctor's medical assistant. If no one answers, please leave a voicemail as directed and we will return your call as soon as possible. Messages left after 4 pm will be answered the following business day.   You may also send [place] a message via MyChart. We typically respond to MyChart messages within 1-2 business days.  For prescription refills, please ask your pharmacy to contact our office. Our fax number is [phone].  If you have an urgent issue when the clinic is closed that cannot wait until the next business day, you can page your doctor at the number below.    Please note that while we do our best to be available for urgent issues outside  of office hours, we are not available 24/7.   If you have an urgent issue and are unable to reach [place], you may choose to seek medical care at your doctor's office, retail clinic, urgent care center, or emergency room.  If you have a medical emergency, please immediately call 911 or go to the emergency department.  Pager Numbers  - Dr. Gwen Pounds: [phone]  - Dr. Neale Burly: [phone]  - Dr. Roseanne Reno: [phone]  In the event of inclement weather, please call our main line at [phone] for an update on the status of any delays or closures.  Dermatology Medication Tips: Please keep the boxes that topical medications come in in order to help keep track of the instructions about where and how to use these. Pharmacies typically print the medication instructions only on the boxes and not directly on the medication tubes.   If your medication is too expensive, please contact our office at [phone] option 4 or send [place] a message through MyChart.   We are unable to tell what your co-pay for medications will be in advance as this is different depending on your insurance coverage. However, we may be able to find a substitute medication at lower cost or fill out paperwork to get insurance to cover a needed medication.   If a prior authorization is required to get your medication covered by your insurance company, please allow [place] 1-2 business days to complete this process.  Drug prices often vary depending on where the prescription is filled and some pharmacies may offer cheaper prices.  The website www.goodrx.com contains coupons for medications through different pharmacies. The prices here do not account for what the cost may be with help from insurance (it may be cheaper with your insurance), but the website can give you the price if you did not use any insurance.  - You can print the associated coupon and take it with your prescription to the pharmacy.  - You may also stop by our office  during regular business hours and pick up a GoodRx coupon card.  - If you need your prescription sent electronically to a different pharmacy, notify our office through [facility] or by phone at [phone] option 4.     Si Usted Necesita Algo Despus de Su Visita  Tambin puede enviarnos un mensaje a travs de Clinical cytogeneticist. Por lo general respondemos a los mensajes de MyChart en el transcurso de 1 a 2 das hbiles.  Para renovar recetas, por favor pida a su farmacia que se ponga en contacto con nuestra oficina. Annie Sable de fax es [place] [phone].  Si tiene un asunto urgente cuando la clnica est cerrada y que no puede esperar hasta el siguiente da hbil, puede llamar/localizar a su doctor(a) al nmero que aparece a continuacin.   Por favor, tenga en cuenta que aunque hacemos todo lo posible para estar disponibles para asuntos urgentes fuera del horario de [place], no estamos disponibles las 24 horas del da, los 7 [address]  [address] de la [place].   Si tiene un problema urgente y no puede comunicarse con nosotros, puede optar por buscar atencin mdica  en el consultorio de su doctor(a), en una clnica privada, en un centro de atencin urgente o en una sala de emergencias.  Si tiene Engineer, drilling, por favor llame inmediatamente al 911 o vaya a la sala de emergencias.  Nmeros de bper  - Dr. Gwen Pounds: [phone]  - Dra. Moye: [phone]  - Dra. Roseanne Reno: [phone]  En caso de inclemencias del [place], por favor llame a Lacy Duverney principal al [phone] para una actualizacin sobre el [place] de cualquier retraso o cierre.  Consejos para la medicacin en dermatologa: Por favor, guarde las cajas en las que vienen los medicamentos de uso tpico para ayudarle a seguir las instrucciones sobre dnde y cmo usarlos. Las farmacias generalmente imprimen las instrucciones del medicamento slo en las cajas y no directamente en los tubos del [place].   Si su medicamento es  muy caro, por favor, pngase en contacto con Rolm Gala llamando al [phone] y presione la opcin 4 o envenos un mensaje a travs de Clinical cytogeneticist.   No podemos decirle cul ser su copago por los medicamentos por adelantado ya que esto es diferente dependiendo de la cobertura de su seguro. Sin embargo, es posible que podamos encontrar un medicamento sustituto a Audiological scientist un formulario para que el seguro cubra el medicamento que se considera necesario.   Si se requiere una autorizacin previa para que su compaa de seguros [place] su medicamento, por favor permtanos de 1 a 2 das hbiles para completar [address]  Los precios de los medicamentos varan con frecuencia dependiendo del Environmental consultant de dnde se surte la receta y alguna farmacias pueden ofrecer precios ms baratos.  El sitio web www.goodrx.com tiene cupones para medicamentos de Health and safety inspector. Los precios aqu no tienen en cuenta lo que podra costar con la ayuda del seguro (puede ser ms  barato con su seguro), pero el sitio web puede darle el precio si no utiliz ningn seguro.  - Puede imprimir el cupn correspondiente y llevarlo con su receta a la farmacia.  - Tambin puede pasar por nuestra oficina durante el horario de atencin regular y recoger una tarjeta de cupones de GoodRx.  - Si necesita que su receta se enve electrnicamente a una farmacia diferente, informe a nuestra oficina a travs de MyChart de [place] o por telfono llamando al [phone] y presione la opcin 4.  

## 2022-06-30 ENCOUNTER — Encounter: Payer: Self-pay | Admitting: Dermatology

## 2022-07-09 DIAGNOSIS — R54 Age-related physical debility: Secondary | ICD-10-CM | POA: Diagnosis not present

## 2022-07-23 DIAGNOSIS — H401112 Primary open-angle glaucoma, right eye, moderate stage: Secondary | ICD-10-CM | POA: Diagnosis not present

## 2022-07-23 DIAGNOSIS — H401121 Primary open-angle glaucoma, left eye, mild stage: Secondary | ICD-10-CM | POA: Diagnosis not present

## 2022-07-23 DIAGNOSIS — Z961 Presence of intraocular lens: Secondary | ICD-10-CM | POA: Diagnosis not present

## 2022-07-23 DIAGNOSIS — H43813 Vitreous degeneration, bilateral: Secondary | ICD-10-CM | POA: Diagnosis not present

## 2022-07-24 DIAGNOSIS — I10 Essential (primary) hypertension: Secondary | ICD-10-CM | POA: Diagnosis not present

## 2022-08-06 DIAGNOSIS — F331 Major depressive disorder, recurrent, moderate: Secondary | ICD-10-CM | POA: Diagnosis not present

## 2022-08-06 DIAGNOSIS — R54 Age-related physical debility: Secondary | ICD-10-CM | POA: Diagnosis not present

## 2022-08-27 DIAGNOSIS — I10 Essential (primary) hypertension: Secondary | ICD-10-CM | POA: Diagnosis not present

## 2022-09-10 DIAGNOSIS — I10 Essential (primary) hypertension: Secondary | ICD-10-CM | POA: Diagnosis not present

## 2022-09-10 DIAGNOSIS — R739 Hyperglycemia, unspecified: Secondary | ICD-10-CM | POA: Diagnosis not present

## 2022-09-10 DIAGNOSIS — R54 Age-related physical debility: Secondary | ICD-10-CM | POA: Diagnosis not present

## 2022-09-10 DIAGNOSIS — E7800 Pure hypercholesterolemia, unspecified: Secondary | ICD-10-CM | POA: Diagnosis not present

## 2022-09-29 DIAGNOSIS — I10 Essential (primary) hypertension: Secondary | ICD-10-CM | POA: Diagnosis not present

## 2022-10-06 DIAGNOSIS — I10 Essential (primary) hypertension: Secondary | ICD-10-CM | POA: Diagnosis not present

## 2022-10-08 DIAGNOSIS — I10 Essential (primary) hypertension: Secondary | ICD-10-CM | POA: Diagnosis not present

## 2022-11-05 ENCOUNTER — Other Ambulatory Visit: Payer: Self-pay | Admitting: Dermatology

## 2022-11-05 DIAGNOSIS — L219 Seborrheic dermatitis, unspecified: Secondary | ICD-10-CM

## 2022-11-05 DIAGNOSIS — I10 Essential (primary) hypertension: Secondary | ICD-10-CM | POA: Diagnosis not present

## 2022-11-24 DIAGNOSIS — I10 Essential (primary) hypertension: Secondary | ICD-10-CM | POA: Diagnosis not present

## 2022-12-02 DIAGNOSIS — L218 Other seborrheic dermatitis: Secondary | ICD-10-CM | POA: Diagnosis not present

## 2022-12-03 DIAGNOSIS — Z0000 Encounter for general adult medical examination without abnormal findings: Secondary | ICD-10-CM | POA: Diagnosis not present

## 2022-12-03 DIAGNOSIS — E7800 Pure hypercholesterolemia, unspecified: Secondary | ICD-10-CM | POA: Diagnosis not present

## 2022-12-23 DIAGNOSIS — I10 Essential (primary) hypertension: Secondary | ICD-10-CM | POA: Diagnosis not present

## 2022-12-24 ENCOUNTER — Ambulatory Visit: Payer: Medicare Other | Admitting: Dermatology

## 2023-01-07 DIAGNOSIS — I10 Essential (primary) hypertension: Secondary | ICD-10-CM | POA: Diagnosis not present

## 2023-01-07 DIAGNOSIS — Z79899 Other long term (current) drug therapy: Secondary | ICD-10-CM | POA: Diagnosis not present

## 2023-01-13 DIAGNOSIS — I700 Atherosclerosis of aorta: Secondary | ICD-10-CM | POA: Diagnosis not present

## 2023-01-13 DIAGNOSIS — K219 Gastro-esophageal reflux disease without esophagitis: Secondary | ICD-10-CM | POA: Diagnosis not present

## 2023-01-18 DIAGNOSIS — H401112 Primary open-angle glaucoma, right eye, moderate stage: Secondary | ICD-10-CM | POA: Diagnosis not present

## 2023-01-21 DIAGNOSIS — L218 Other seborrheic dermatitis: Secondary | ICD-10-CM | POA: Diagnosis not present

## 2023-01-22 DIAGNOSIS — I10 Essential (primary) hypertension: Secondary | ICD-10-CM | POA: Diagnosis not present

## 2023-01-25 DIAGNOSIS — H401121 Primary open-angle glaucoma, left eye, mild stage: Secondary | ICD-10-CM | POA: Diagnosis not present

## 2023-01-25 DIAGNOSIS — H401112 Primary open-angle glaucoma, right eye, moderate stage: Secondary | ICD-10-CM | POA: Diagnosis not present

## 2023-01-25 DIAGNOSIS — H43813 Vitreous degeneration, bilateral: Secondary | ICD-10-CM | POA: Diagnosis not present

## 2023-01-25 DIAGNOSIS — Z961 Presence of intraocular lens: Secondary | ICD-10-CM | POA: Diagnosis not present

## 2023-02-04 DIAGNOSIS — I959 Hypotension, unspecified: Secondary | ICD-10-CM | POA: Diagnosis not present

## 2023-02-09 DIAGNOSIS — I959 Hypotension, unspecified: Secondary | ICD-10-CM | POA: Diagnosis not present

## 2023-02-18 DIAGNOSIS — L02212 Cutaneous abscess of back [any part, except buttock]: Secondary | ICD-10-CM | POA: Diagnosis not present

## 2023-02-23 DIAGNOSIS — I10 Essential (primary) hypertension: Secondary | ICD-10-CM | POA: Diagnosis not present

## 2023-03-04 DIAGNOSIS — R739 Hyperglycemia, unspecified: Secondary | ICD-10-CM | POA: Diagnosis not present

## 2023-03-04 DIAGNOSIS — E7800 Pure hypercholesterolemia, unspecified: Secondary | ICD-10-CM | POA: Diagnosis not present

## 2023-03-04 DIAGNOSIS — I10 Essential (primary) hypertension: Secondary | ICD-10-CM | POA: Diagnosis not present

## 2023-03-04 DIAGNOSIS — F331 Major depressive disorder, recurrent, moderate: Secondary | ICD-10-CM | POA: Diagnosis not present

## 2023-03-11 IMAGING — CT CT VENOGRAM HEAD
3 of 7 series · 10 of 36 positions shown · IV contrast (APPLIED)
Comparison: Head MRI 03/30/2021

CLINICAL DATA: Dural venous sinus thrombosis suspected. Abnormal T2
signal in the left sigmoid sinus on MRI brain.

EXAM:
CT VENOGRAM HEAD
TECHNIQUE: Venographic phase images of the brain were obtained following the
administration of intravenous contrast. Multiplanar reformats and
maximum intensity projections were generated.

[Series 2: head w · axial · 0.39mm/px · z∈[+653,+703]mm · 2 of 75 slices shown]
[im 25/75  soft-tissue]
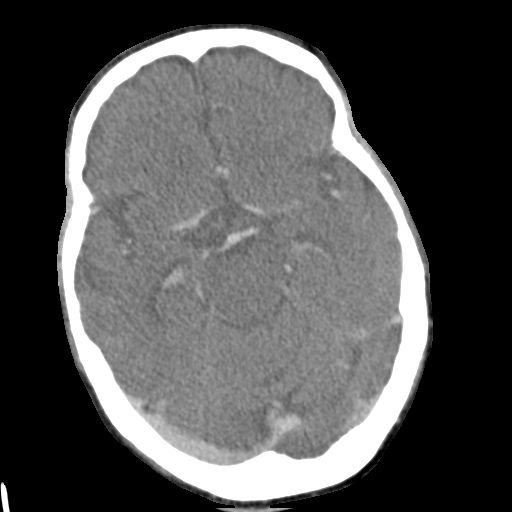
[im 50/75  soft-tissue]
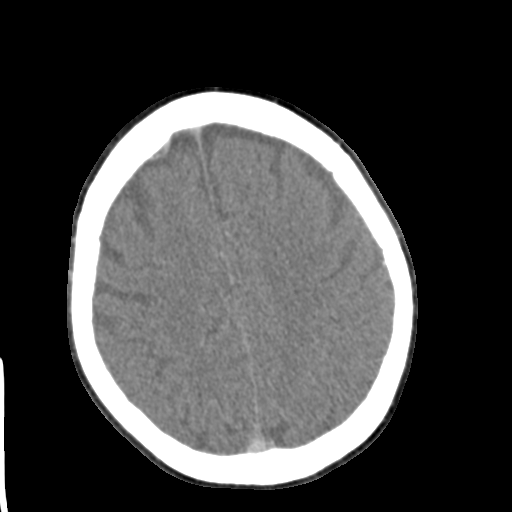

[Series 3: ax thin · axial · 0.34mm/px · z∈[+614,+720]mm · 6 of 152 slices shown]
[im 22/152  soft-tissue]
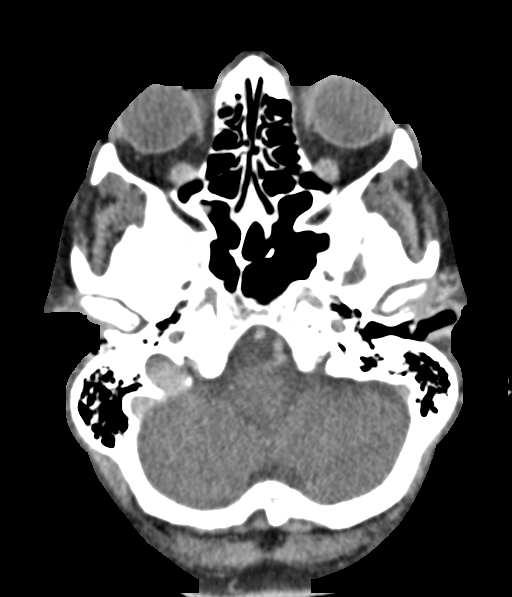
[im 44/152  bone]
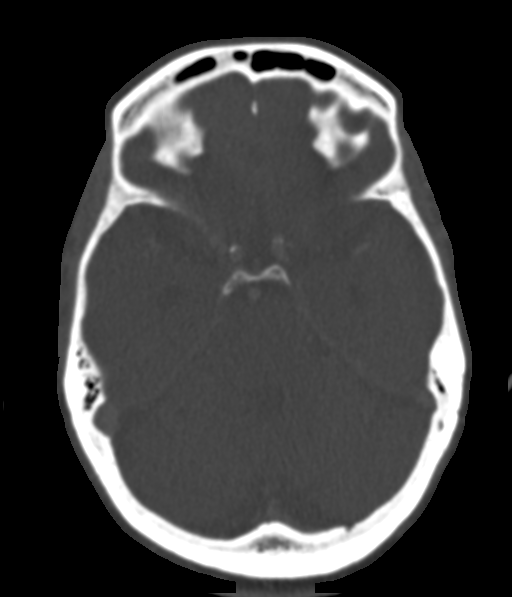
[im 65/152  soft-tissue]
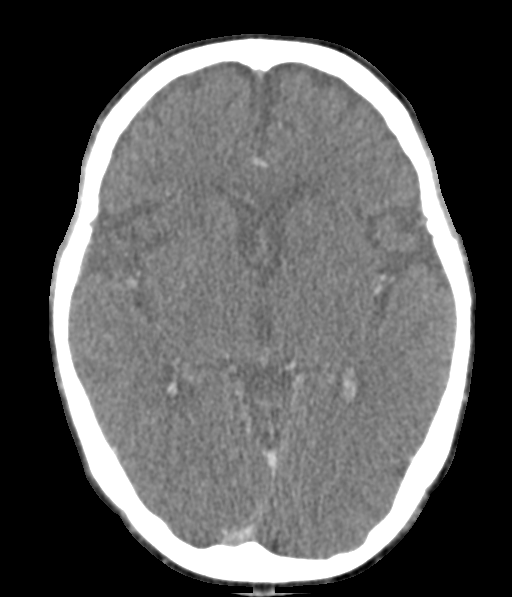
[im 87/152  bone]
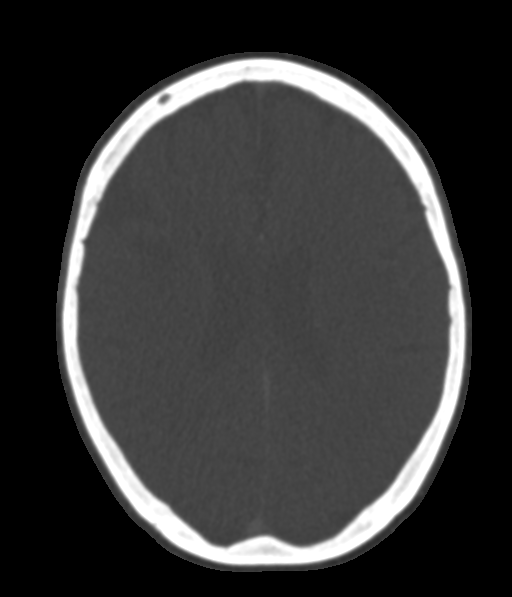
[im 108/152  soft-tissue]
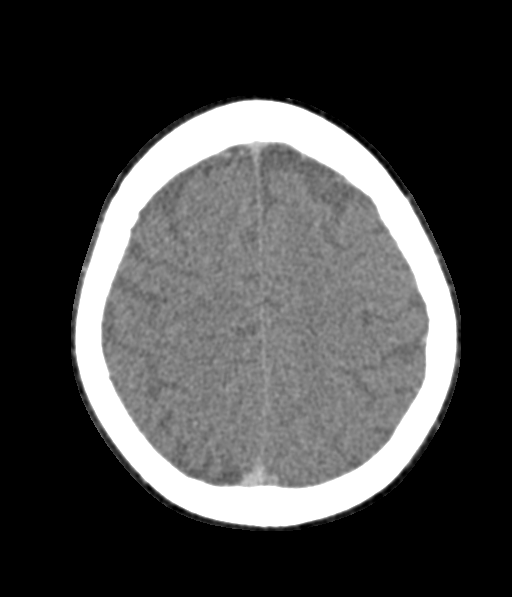
[im 130/152  bone]
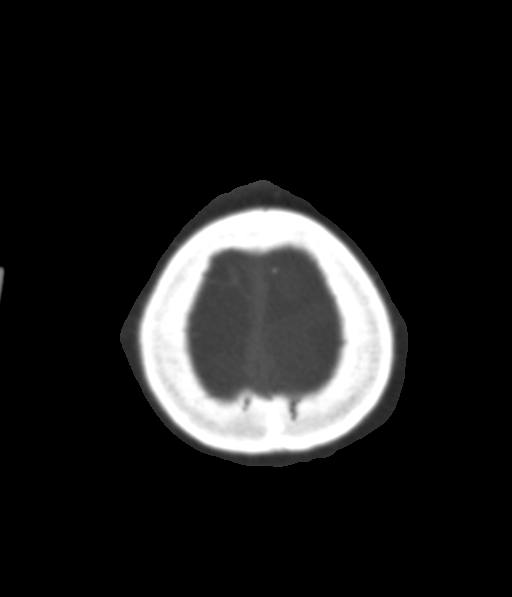

[Series 7: sag thin · sagittal · 0.29mm/px · 2 of 169 slices shown]
[im 12/169  soft-tissue]
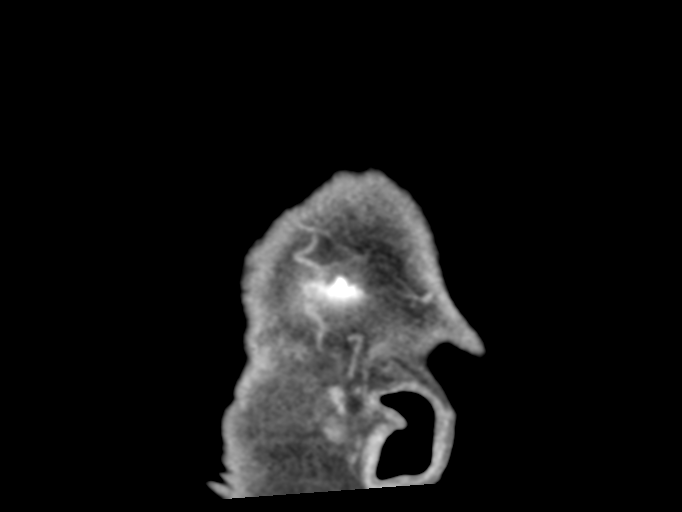
[im 160/169  soft-tissue]
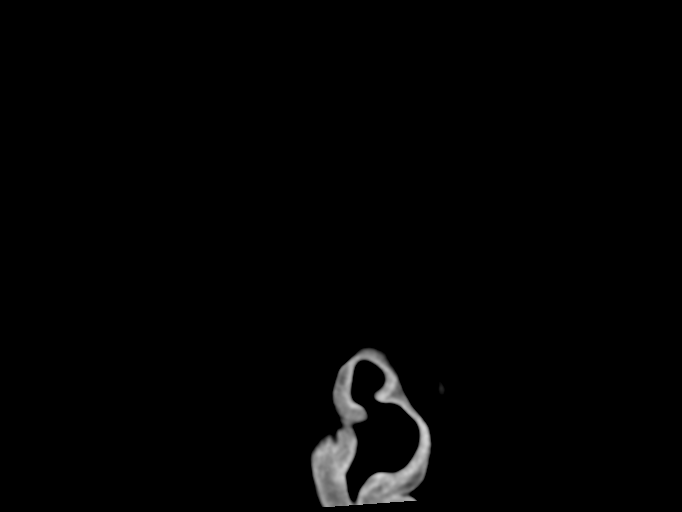

[10 of 36 positions shown; findings below may reference images not displayed]

RADIATION DOSE REDUCTION: This exam was performed according to the
departmental dose-optimization program which includes automated
exposure control, adjustment of the mA and/or kV according to
patient size and/or use of iterative reconstruction technique.

CONTRAST:  75mL OMNIPAQUE IOHEXOL 350 MG/ML SOLN
FINDINGS: The superior sagittal sinus, internal cerebral veins, vein of [place],
straight sinus, transverse sinuses, sigmoid sinuses, and jugular
bulbs are patent without evidence of thrombus. The right transverse
and sigmoid sinuses are dominant. An arachnoid granulation is noted
in the lateral aspect of the left transverse sinus. T2
hyperintensity in the left sigmoid sinus on MRI likely reflects slow
flow.
IMPRESSION: No evidence of dural venous sinus thrombosis.

## 2023-03-15 ENCOUNTER — Ambulatory Visit: Payer: Medicare Other | Admitting: Dermatology

## 2023-03-15 DIAGNOSIS — D1801 Hemangioma of skin and subcutaneous tissue: Secondary | ICD-10-CM

## 2023-03-15 DIAGNOSIS — L720 Epidermal cyst: Secondary | ICD-10-CM

## 2023-03-15 DIAGNOSIS — Z1283 Encounter for screening for malignant neoplasm of skin: Secondary | ICD-10-CM | POA: Diagnosis not present

## 2023-03-15 DIAGNOSIS — L814 Other melanin hyperpigmentation: Secondary | ICD-10-CM

## 2023-03-15 DIAGNOSIS — L219 Seborrheic dermatitis, unspecified: Secondary | ICD-10-CM

## 2023-03-15 DIAGNOSIS — L821 Other seborrheic keratosis: Secondary | ICD-10-CM | POA: Diagnosis not present

## 2023-03-15 DIAGNOSIS — L570 Actinic keratosis: Secondary | ICD-10-CM

## 2023-03-15 DIAGNOSIS — W908XXA Exposure to other nonionizing radiation, initial encounter: Secondary | ICD-10-CM

## 2023-03-15 DIAGNOSIS — L578 Other skin changes due to chronic exposure to nonionizing radiation: Secondary | ICD-10-CM

## 2023-03-15 DIAGNOSIS — D692 Other nonthrombocytopenic purpura: Secondary | ICD-10-CM

## 2023-03-15 DIAGNOSIS — D229 Melanocytic nevi, unspecified: Secondary | ICD-10-CM

## 2023-03-15 DIAGNOSIS — L723 Sebaceous cyst: Secondary | ICD-10-CM

## 2023-03-15 MED ORDER — CICLOPIROX 1 % EX SHAM: MEDICATED_SHAMPOO | CUTANEOUS | 6 refills | Status: DC

## 2023-03-15 MED ORDER — DOXYCYCLINE MONOHYDRATE 100 MG PO CAPS
ORAL_CAPSULE | ORAL | 1 refills | Status: DC
Start: 2023-03-15 — End: 2023-08-02

## 2023-03-15 NOTE — Patient Instructions (Addendum)
 Restart Doxycycline  100 mg take 1 tablet twice a day with food x 14 days, then take 1 tablet daily until prescription is gone    Doxycycline  should be taken with food to prevent nausea. Do not lay down for 30 minutes after taking. Be cautious with sun exposure and use good sun protection while on this medication. Pregnant women should not take this medication.       Due to recent changes in healthcare laws, you may see results of your pathology and/or laboratory studies on MyChart before the doctors have had a chance to review them. We understand that in some cases there may be results that are confusing or concerning to you. Please understand that not all results are received at the same time and often the doctors may need to interpret multiple results in order to provide you with the best plan of care or course of treatment. Therefore, we ask that you please give us  2 business days to thoroughly review all your results before contacting the office for clarification. Should we see a critical lab result, you will be contacted sooner.   If You Need Anything After Your Visit  If you have any questions or concerns for your doctor, please call our main line at [phone] and press option 4 to reach your doctor's medical assistant. If no one answers, please leave a voicemail as directed and we will return your call as soon as possible. Messages left after 4 pm will be answered the following business day.   You may also send us  a message via MyChart. We typically respond to MyChart messages within 1-2 business days.  For prescription refills, please ask your pharmacy to contact our office. Our fax number is [phone].  If you have an urgent issue when the clinic is closed that cannot wait until the next business day, you can page your doctor at the number below.    Please note that while we do our best to be available for urgent issues outside of office hours, we are not available 24/7.   If  you have an urgent issue and are unable to reach us , you may choose to seek medical care at your doctor's office, retail clinic, urgent care center, or emergency room.  If you have a medical emergency, please immediately call 911 or go to the emergency department.  Pager Numbers  - Dr. Hester: [phone]  - Dr. Jackquline: [phone]  - Dr. Claudene: [phone]   In the event of inclement weather, please call our main line at [phone] for an update on the status of any delays or closures.  Dermatology Medication Tips: Please keep the boxes that topical medications come in in order to help keep track of the instructions about where and how to use these. Pharmacies typically print the medication instructions only on the boxes and not directly on the medication tubes.   If your medication is too expensive, please contact our office at [phone] option 4 or send us  a message through MyChart.   We are unable to tell what your co-pay for medications will be in advance as this is different depending on your insurance coverage. However, we may be able to find a substitute medication at lower cost or fill out paperwork to get insurance to cover a needed medication.   If a prior authorization is required to get your medication covered by your insurance company, please allow us  1-2 business days to complete this process.  Drug prices often vary depending on  where the prescription is filled and some pharmacies may offer cheaper prices.  The website www.goodrx.com contains coupons for medications through different pharmacies. The prices here do not account for what the cost may be with help from insurance (it may be cheaper with your insurance), but the website can give you the price if you did not use any insurance.  - You can print the associated coupon and take it with your prescription to the pharmacy.  - You may also stop by our office during regular business hours and pick up a GoodRx  coupon card.  - If you need your prescription sent electronically to a different pharmacy, notify our office through [facility] or by phone at [phone] option 4.     Si Usted Necesita Algo Despus de Su Visita  Tambin puede enviarnos un mensaje a travs de Clinical Cytogeneticist. Por lo general respondemos a los mensajes de MyChart en el transcurso de 1 a 2 das hbiles.  Para renovar recetas, por favor pida a su farmacia que se ponga en contacto con nuestra oficina. Randi lakes de fax es [place] [phone].  Si tiene un asunto urgente cuando la clnica est cerrada y que no puede esperar hasta el siguiente da hbil, puede llamar/localizar a su doctor(a) al nmero que aparece a continuacin.   Por favor, tenga en cuenta que aunque hacemos todo lo posible para estar disponibles para asuntos urgentes fuera del horario de [place], no estamos disponibles las 24 horas del da, los 7 [phone] de la [place].   Si tiene un problema urgente y no puede comunicarse con nosotros, puede optar por buscar atencin mdica  en el consultorio de su doctor(a), en una clnica privada, en un centro de atencin urgente o en una sala de emergencias.  Si tiene engineer, drilling, por favor llame inmediatamente al 911 o vaya a la sala de emergencias.  Nmeros de bper  - Dr. Hester: [phone]  - Dra. Jackquline: [phone]  - Dr. Claudene: [phone]   En caso de inclemencias del tiempo, por favor llame a landry capes principal al [phone] para una actualizacin sobre el [place] de cualquier retraso o cierre.  Consejos para la medicacin en dermatologa: Por favor, guarde las cajas en las que vienen los medicamentos de uso tpico para ayudarle a seguir las instrucciones sobre dnde y cmo usarlos. Las farmacias generalmente imprimen las instrucciones del medicamento slo en las cajas y no directamente en los tubos del [place].   Si su medicamento es muy caro, por favor, pngase en contacto con  landry rieger llamando al [phone] y presione la opcin 4 o envenos un mensaje a travs de Clinical Cytogeneticist.   No podemos decirle cul ser su copago por los medicamentos por adelantado ya que esto es diferente dependiendo de la cobertura de su seguro. Sin embargo, es posible que podamos encontrar un medicamento sustituto a audiological scientist un formulario para que el seguro cubra el medicamento que se considera necesario.   Si se requiere una autorizacin previa para que su compaa de seguros cubra su medicamento, por favor permtanos de 1 a 2 das hbiles para completar este proceso.  Los precios de los medicamentos varan con frecuencia dependiendo del environmental consultant de dnde se surte la receta y alguna farmacias pueden ofrecer precios ms baratos.  El sitio web www.goodrx.com tiene cupones para medicamentos de health and safety inspector. Los precios aqu no tienen en cuenta lo que podra costar con la ayuda del seguro (puede ser ms barato con su seguro), [employer]  el sitio web puede darle el precio si no visual merchandiser.  - Puede imprimir el cupn correspondiente y llevarlo con su receta a la farmacia.  - Tambin puede pasar por nuestra oficina durante el horario de atencin regular y education officer, museum una tarjeta de cupones de GoodRx.  - Si necesita que su receta se enve electrnicamente a una farmacia diferente, informe a nuestra oficina a travs de MyChart de [place] o por telfono llamando al [phone] y presione la opcin 4.

## 2023-03-15 NOTE — Progress Notes (Addendum)
 Follow-Up Visit   Subjective  Chelsea Hart is a 89 y.o. female who presents for the following: Skin Cancer Screening and Full Body Skin Exam, check her back she had a cyst removed by another dermatologist ~3 weeks ago, area still draining. Check her scalp hx of seb derm treating with Clobetasol  shampoo and Clobetasol  solution with a good response, past treatment Ketoconazole  shampoo did not help.   The patient presents for Total-Body Skin Exam (TBSE) for skin cancer screening and mole check. The patient has spots, moles and lesions to be evaluated, some may be new or changing and the patient may have concern these could be cancer.  Daughter is with patient and contributes to history.   The following portions of the chart were reviewed this encounter and updated as appropriate: medications, allergies, medical history  Review of Systems:  No other skin or systemic complaints except as noted in HPI or Assessment and Plan.  Objective  Well appearing patient in no apparent distress; mood and affect are within normal limits.  A full examination was performed including scalp, head, eyes, ears, nose, lips, neck, chest, axillae, abdomen, back, buttocks, bilateral upper extremities, bilateral lower extremities, hands, feet, fingers, toes, fingernails, and toenails. All findings within normal limits unless otherwise noted below.   Relevant physical exam findings are noted in the Assessment and Plan.  Left Thigh - Anterior, right dorsum hand x 1 Erythematous thin papules/macules with gritty scale.   Assessment & Plan   SKIN CANCER SCREENING PERFORMED TODAY.  ACTINIC DAMAGE - Chronic condition, secondary to cumulative UV/sun exposure - diffuse scaly erythematous macules with underlying dyspigmentation - Recommend daily broad spectrum sunscreen SPF 30+ to sun-exposed areas, reapply every 2 hours as needed.  - Staying in the shade or wearing long sleeves, sun glasses (UVA+UVB protection) and  wide brim hats (4-inch brim around the entire circumference of the hat) are also recommended for sun protection.  - Call for new or changing lesions.   SEBORRHEIC KERATOSIS  Left lateral cheek 1.5 cm waxy tan patch  - Benign-appearing - Discussed benign etiology and prognosis. - Observe - Call for any changes  LENTIGINES, SEBORRHEIC KERATOSES, HEMANGIOMAS - Benign normal skin lesions - Benign-appearing - Call for any changes  MELANOCYTIC NEVI - Tan-brown and/or pink-flesh-colored symmetric macules and papules - Benign appearing on exam today - Observation - Call clinic for new or changing moles - Recommend daily use of broad spectrum spf 30+ sunscreen to sun-exposed areas.   SEBORRHEIC DERMATITIS With Pruritis Exam: mild scale of the scalp   Chronic and persistent condition with duration or expected duration over one year. Condition is improving with treatment but not currently at goal.   Seborrheic Dermatitis is a chronic persistent rash characterized by pinkness and scaling most commonly of the mid face but also can occur on the scalp (dandruff), ears; mid chest, mid back and groin.  It tends to be exacerbated by stress and cooler weather.  People who have neurologic disease may experience new onset or exacerbation of existing seborrheic dermatitis.  The condition is not curable but treatable and can be controlled.   Treatment Plan: Cont Clobetasol  shampoo 1x/wk, let sit 20 minutes to dry scalp and rinse out Cont  Clobetasol  sol to irritated areas on scalp up to 4d/wk every day/bid prn irritation/itching only  Start Ciclopirox  shampoo 1x/wk, left sit 5 minutes then rinse out    Topical steroids (such as triamcinolone, fluocinolone, fluocinonide, mometasone, clobetasol , halobetasol, betamethasone, hydrocortisone) can cause thinning and  lightening of the skin if they are used for too long in the same area. Your physician has selected the right strength medicine for your problem  and area affected on the body. Please use your medication only as directed by your physician to prevent side effects.     INFLAMED EPIDERMAL INCLUSION CYST Exam: erythematous subcutaneous nodule with drainage and open at incision site at spinal mid upper back  Benign-appearing. Exam most consistent with an epidermal inclusion cyst. Discussed that a cyst is a benign growth that can grow over time and sometimes get irritated or inflamed. Recommend observation if it is not bothersome. Discussed option of surgical excision to remove it if it is growing, symptomatic, or other changes noted. Please call for new or changing lesions so they can be evaluated.  Restart Doxycycline  Monohydrate 100 mg #60  take 1 tablet twice a day with food x 14 days, then decrease to 1 tablet daily until prescription is gone  Area massaged and drainage extracted.  Recommend warm compress and gentle massage daily until no longer draining.  If worsens, return for repeat I&D.   Doxycycline  should be taken with food to prevent nausea. Do not lay down for 30 minutes after taking. Be cautious with sun exposure and use good sun protection while on this medication. Pregnant women should not take this medication.  May consider surgery removal in the future once no longer inflamed.    Purpura - Chronic; persistent and recurrent.  Treatable, but not curable. Arms,legs   - Violaceous macules and patches - Benign - Related to trauma, age, sun damage and/or use of blood thinners, chronic use of topical and/or oral steroids - Observe - Can use OTC arnica containing moisturizer such as Dermend Bruise Formula if desired - Call for worsening or other concerns   HYPERTROPHIC ACTINIC KERATOSIS (2) Left Thigh - Anterior, right dorsum hand x 1 Actinic keratoses are precancerous spots that appear secondary to cumulative UV radiation exposure/sun exposure over time. They are chronic with expected duration over 1 year. A portion of actinic  keratoses will progress to squamous cell carcinoma of the skin. It is not possible to reliably predict which spots will progress to skin cancer and so treatment is recommended to prevent development of skin cancer.  Recommend daily broad spectrum sunscreen SPF 30+ to sun-exposed areas, reapply every 2 hours as needed.  Recommend staying in the shade or wearing long sleeves, sun glasses (UVA+UVB protection) and wide brim hats (4-inch brim around the entire circumference of the hat). Call for new or changing lesions.  Destruction of lesion - right dorsum hand x 1  Destruction method: cryotherapy   Informed consent: discussed and consent obtained   Lesion destroyed using liquid nitrogen: Yes   Region frozen until ice ball extended beyond lesion: Yes   Outcome: patient tolerated procedure well with no complications   Post-procedure details: wound care instructions given   Additional details:  Prior to procedure, discussed risks of blister formation, small wound, skin dyspigmentation, or rare scar following cryotherapy. Recommend Vaseline ointment to treated areas while healing.    Return in about 1 year (around 03/14/2024) for TBSE, Seb derm.  I, Fay Kirks, CMA, am acting as scribe for Rexene Rattler, MD .   Documentation: I have reviewed the above documentation for accuracy and completeness, and I agree with the above.  Rexene Rattler, MD

## 2023-03-15 NOTE — Addendum Note (Signed)
 Addended by: Willeen Niece on: 03/15/2023 07:26 PM   Modules accepted: Level of Service

## 2023-03-23 ENCOUNTER — Encounter: Payer: Self-pay | Admitting: Intensive Care

## 2023-03-23 ENCOUNTER — Emergency Department: Payer: Medicare Other

## 2023-03-23 ENCOUNTER — Other Ambulatory Visit: Payer: Self-pay

## 2023-03-23 ENCOUNTER — Emergency Department
Admission: EM | Admit: 2023-03-23 | Discharge: 2023-03-23 | Disposition: A | Discharge status: Home / Self Care | Payer: Medicare Other | Attending: Emergency Medicine | Admitting: Emergency Medicine

## 2023-03-23 DIAGNOSIS — I10 Essential (primary) hypertension: Secondary | ICD-10-CM

## 2023-03-23 DIAGNOSIS — I6782 Cerebral ischemia: Secondary | ICD-10-CM | POA: Diagnosis not present

## 2023-03-23 DIAGNOSIS — R519 Headache, unspecified: Secondary | ICD-10-CM

## 2023-03-23 HISTORY — DX: Essential (primary) hypertension: I10

## 2023-03-23 LAB — URINALYSIS, ROUTINE W REFLEX MICROSCOPIC
Bacteria, UA: NONE SEEN
Bilirubin Urine: NEGATIVE
Glucose, UA: NEGATIVE mg/dL
Hgb urine dipstick: NEGATIVE
Ketones, ur: NEGATIVE mg/dL
Leukocytes,Ua: NEGATIVE
Nitrite: NEGATIVE
Protein, ur: 100 mg/dL — AB
Specific Gravity, Urine: 1.006 (ref 1.005–1.030)
pH: 7 (ref 5.0–8.0)

## 2023-03-23 LAB — CBC WITH DIFFERENTIAL/PLATELET
Abs Immature Granulocytes: 0.02 10*3/uL (ref 0.00–0.07)
Basophils Absolute: 0.1 10*3/uL (ref 0.0–0.1)
Basophils Relative: 1 %
Eosinophils Absolute: 0.1 10*3/uL (ref 0.0–0.5)
Eosinophils Relative: 1 %
HCT: 37.8 % (ref 36.0–46.0)
Hemoglobin: 12.6 g/dL (ref 12.0–15.0)
Immature Granulocytes: 0 %
Lymphocytes Relative: 34 %
Lymphs Abs: 2.9 10*3/uL (ref 0.7–4.0)
MCH: 32.4 pg (ref 26.0–34.0)
MCHC: 33.3 g/dL (ref 30.0–36.0)
MCV: 97.2 fL (ref 80.0–100.0)
Monocytes Absolute: 0.7 10*3/uL (ref 0.1–1.0)
Monocytes Relative: 8 %
Neutro Abs: 4.8 10*3/uL (ref 1.7–7.7)
Neutrophils Relative %: 56 %
Platelets: 340 10*3/uL (ref 150–400)
RBC: 3.89 MIL/uL (ref 3.87–5.11)
RDW: 14.1 % (ref 11.5–15.5)
WBC: 8.5 10*3/uL (ref 4.0–10.5)
nRBC: 0 % (ref 0.0–0.2)

## 2023-03-23 LAB — TROPONIN I (HIGH SENSITIVITY)
Troponin I (High Sensitivity): 11 ng/L (ref <18)
Troponin I (High Sensitivity): 12 ng/L (ref <18)

## 2023-03-23 LAB — BASIC METABOLIC PANEL
Anion gap: 11 (ref 5–15)
BUN: 14 mg/dL (ref 8–23)
CO2: 21 mmol/L — ABNORMAL LOW (ref 22–32)
Calcium: 10.2 mg/dL (ref 8.9–10.3)
Chloride: 102 mmol/L (ref 98–111)
Creatinine, Ser: 1.07 mg/dL — ABNORMAL HIGH (ref 0.44–1.00)
GFR, Estimated: 50 mL/min — ABNORMAL LOW (ref >60)
Glucose, Bld: 118 mg/dL — ABNORMAL HIGH (ref 70–99)
Potassium: 3.4 mmol/L — ABNORMAL LOW (ref 3.5–5.1)
Sodium: 134 mmol/L — ABNORMAL LOW (ref 135–145)

## 2023-03-23 MED ORDER — METOPROLOL TARTRATE 25 MG PO TABS
25.0000 mg | ORAL_TABLET | Freq: Once | ORAL | Status: AC
  Administered 2023-03-23: 25 mg via ORAL
  Filled 2023-03-23: qty 1

## 2023-03-23 MED ORDER — ACETAMINOPHEN 500 MG PO TABS
1000.0000 mg | ORAL_TABLET | Freq: Once | ORAL | Status: AC
  Administered 2023-03-23: 1000 mg via ORAL
  Filled 2023-03-23: qty 2

## 2023-03-23 MED ORDER — PROCHLORPERAZINE EDISYLATE 10 MG/2ML IJ SOLN
10.0000 mg | Freq: Once | INTRAMUSCULAR | Status: AC
  Administered 2023-03-23: 10 mg via INTRAVENOUS
  Filled 2023-03-23: qty 2

## 2023-03-23 MED ORDER — ALPRAZOLAM 0.5 MG PO TABS
0.5000 mg | ORAL_TABLET | Freq: Once | ORAL | Status: AC
  Administered 2023-03-23: 0.5 mg via ORAL
  Filled 2023-03-23: qty 1

## 2023-03-23 NOTE — ED Notes (Signed)
 Patient cleaned and brief changed

## 2023-03-23 NOTE — ED Provider Triage Note (Signed)
Emergency Medicine Provider Triage Evaluation Note  Chelsea Hart , a 89 y.o. female  was evaluated in triage.  Pt complains of headache since yesterday morning and elevated BP. Reports she takes lisinopril and metoprolol and has not missed any doses. Reports nausea, no vomiting. No CP.  Review of Systems  Positive: HA, nausea Negative: CP/SOB  Physical Exam  BP (!) 236/110 (BP Location: Left Arm)   Pulse 69   Temp 98 F (36.7 C) (Oral)   Resp 18   SpO2 97%  Gen:   Awake, no distress   Resp:  Normal effort  MSK:   Moves extremities without difficulty  Other:    Medical Decision Making  Medically screening exam initiated at 4:50 PM.  Appropriate orders placed.  Chelsea Hart was informed that the remainder of the evaluation will be completed by another provider, this initial triage assessment does not replace that evaluation, and the importance of remaining in the ED until their evaluation is complete.     Chelsea Hoehn, PA-C 03/23/23 1653

## 2023-03-23 NOTE — ED Triage Notes (Signed)
Patient presents with high blood pressure and headache. Also c/o frequent urination

## 2023-03-23 NOTE — ED Notes (Signed)
Patient transported to CT 

## 2023-03-23 NOTE — ED Provider Notes (Signed)
[facility] Provider Note    Event Date/Time   First MD Initiated Contact with Patient 03/23/23 1754     (approximate)   History   Hypertension and Headache   HPI  Chelsea Hart is a 89 y.o. female who presents to the ED for evaluation of Hypertension and Headache   I review a cardiology clinic visit from 1.5 years ago.  History of HTN, HLD, PAD, anxiety on benzos at home  Patient presents with her daughter for evaluation of a bad headache and subsequent hypertension.  Reports headache started slowly, yesterday.  She felt okay in the morning this morning but headache returned this afternoon.  She has not yet taken any medications for her headache.  Checked her blood pressure this evening and it was quite elevated so she presents to the ED.  Reports a weeks of stress urinary incontinence, no acute changes to this   Physical Exam   Triage Vital Signs: ED Triage Vitals  Encounter Vitals Group     BP 03/23/23 1645 (!) 236/110     Systolic BP Percentile --      Diastolic BP Percentile --      Pulse Rate 03/23/23 1645 69     Resp 03/23/23 1645 18     Temp 03/23/23 1648 98 F (36.7 C)     Temp Source 03/23/23 1648 Oral     SpO2 03/23/23 1645 97 %     Weight 03/23/23 1650 166 lb (75.3 kg)     Height 03/23/23 1650 5' 6.75" (1.695 m)     Head Circumference --      Peak Flow --      Pain Score 03/23/23 1650 6     Pain Loc --      Pain Education --      Exclude from Growth Chart --     Most recent vital signs: Vitals:   03/23/23 2140 03/23/23 2259  BP: (!) 164/79 (!) 167/80  Pulse:  60  Resp:  18  Temp:  (!) 97.5 F (36.4 C)  SpO2:  99%    General: Awake, no distress.  CV:  Good peripheral perfusion.  Resp:  Normal effort.  Abd:  No distention.  MSK:  No deformity noted.  Neuro:  No focal deficits appreciated. Cranial nerves II through XII intact 5/5 strength and sensation in all 4 extremities Other:     ED Results / Procedures /  Treatments   Labs (all labs ordered are listed, but only abnormal results are displayed) Labs Reviewed  BASIC METABOLIC PANEL - Abnormal; Notable for the following components:      Result Value   Sodium 134 (*)    Potassium 3.4 (*)    CO2 21 (*)    Glucose, Bld 118 (*)    Creatinine, Ser 1.07 (*)    GFR, Estimated 50 (*)    All other components within normal limits  URINALYSIS, ROUTINE W REFLEX MICROSCOPIC - Abnormal; Notable for the following components:   Color, Urine STRAW (*)    APPearance CLEAR (*)    Protein, ur 100 (*)    All other components within normal limits  CBC WITH DIFFERENTIAL/PLATELET  TROPONIN I (HIGH SENSITIVITY)  TROPONIN I (HIGH SENSITIVITY)    EKG Sinus rhythm with a rate of 64 bpm.  Normal axis and intervals.  No STEMI.  Tremulous baseline  RADIOLOGY CT head interpreted by me without evidence of acute intracranial pathology  Official radiology report(s): CT Head Wo  Contrast Result Date: 03/23/2023 CLINICAL DATA:  Headache, new onset, beginning yesterday. Hypertension. EXAM: CT HEAD WITHOUT CONTRAST TECHNIQUE: Contiguous axial images were obtained from the base of the skull through the vertex without intravenous contrast. RADIATION DOSE REDUCTION: This exam was performed according to the departmental dose-optimization program which includes automated exposure control, adjustment of the mA and/or kV according to patient size and/or use of iterative reconstruction technique. COMPARISON:  05/14/2021 FINDINGS: Brain: Chronic small-vessel ischemic changes affect the pons. No focal cerebellar insult. Chronic small-vessel ischemic changes affect the thalami, basal ganglia and hemispheric white matter. 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 No sign of acute stroke, mass, hemorrhage, hydrocephalus or extra-axial collection. Vascular: There is atherosclerotic calcification of the major vessels at the base of the brain. Skull: Negative Sinuses/Orbits: Acute  rhinosinusitis of the maxillary sinuses with air-fluid levels. This could be symptomatic. Orbits negative. Other: None IMPRESSION: 1. No acute intracranial finding. Chronic small-vessel ischemic changes of the pons, thalami, basal ganglia and hemispheric white matter. 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 2. Acute rhinosinusitis of the maxillary sinuses with air-fluid levels. This could be symptomatic. Electronically Signed   By: Paulina Fusi M.D.   On: 03/23/2023 17:19    PROCEDURES and INTERVENTIONS:  Procedures  Medications  metoprolol tartrate (LOPRESSOR) tablet 25 mg (25 mg Oral Given 03/23/23 1847)  acetaminophen (TYLENOL) tablet 1,000 mg (1,000 mg Oral Given 03/23/23 1847)  ALPRAZolam (XANAX) tablet 0.5 mg (0.5 mg Oral Given 03/23/23 1847)  prochlorperazine (COMPAZINE) injection 10 mg (10 mg Intravenous Given 03/23/23 1849)     IMPRESSION / MDM / ASSESSMENT AND PLAN / ED COURSE  I reviewed the triage vital signs and the nursing notes.  Differential diagnosis includes, but is not limited to, ICH, stroke, hypertensive emergency, press, anxiety  {Patient presents with symptoms of an acute illness or injury that is potentially life-threatening.  Patient presents with a bad headache and hypertension.  She has a benign workup without evidence of neurologic deficits or trauma.  Clear CT head.  Normal CBC, metabolic panel and urine.  Negative troponins.  Resolving symptoms and improving blood pressure with Tylenol, Xanax and Compazine.  Considered observation admission but she is eager to go home and I think this is reasonable.  Discussed ED return precautions.  Clinical Course as of 03/23/23 2315  Tue Mar 23, 2023  2030 Reassessed.  Feeling much better.  BP is much improved, systolics 120s.  She is appreciative.  She is asking about going home.  We discussed ambulation trial and hopefully outpatient management.  She is agreeable. [DS]  2130 Ambulation trial.  Asymptomatic.  No  dizziness.  Daughter reports she looks good.  No orthostasis. [DS]    Clinical Course User Index [DS] Delton Prairie, MD     FINAL CLINICAL IMPRESSION(S) / ED DIAGNOSES   Final diagnoses:  Bad headache  Uncontrolled hypertension     Rx / DC Orders   ED Discharge Orders     None        Note:  This document was prepared using Dragon voice recognition software and may include unintentional dictation errors.   Delton Prairie, MD 03/23/23 [id number]

## 2023-03-23 NOTE — Discharge Instructions (Signed)
Use Tylenol for pain and fevers.  Up to 1000 mg per dose, up to 4 times per day.  Do not take more than 4000 mg of Tylenol/acetaminophen within 24 hours..  Continue your lisinopril and metoprolol.   Keep an eye on your blood pressures at home, particularly when you are feeling OK at home.   Return to the ED with any worsening symptoms or other concerns.

## 2023-03-29 DIAGNOSIS — R531 Weakness: Secondary | ICD-10-CM | POA: Diagnosis not present

## 2023-03-30 ENCOUNTER — Other Ambulatory Visit: Payer: Self-pay | Admitting: Nephrology

## 2023-03-30 DIAGNOSIS — N179 Acute kidney failure, unspecified: Secondary | ICD-10-CM | POA: Diagnosis not present

## 2023-03-30 DIAGNOSIS — N1831 Chronic kidney disease, stage 3a: Secondary | ICD-10-CM

## 2023-03-30 DIAGNOSIS — I10 Essential (primary) hypertension: Secondary | ICD-10-CM | POA: Diagnosis not present

## 2023-03-31 DIAGNOSIS — I10 Essential (primary) hypertension: Secondary | ICD-10-CM | POA: Diagnosis not present

## 2023-04-01 ENCOUNTER — Telehealth: Payer: Self-pay

## 2023-04-01 DIAGNOSIS — R4182 Altered mental status, unspecified: Secondary | ICD-10-CM | POA: Diagnosis not present

## 2023-04-01 NOTE — Telephone Encounter (Signed)
Patient daughter called to let Dr Roseanne Reno know her mom has not been feeling well lately, she has been having headaches, mood swings and  unstable, so they have decided to stop Doxycycline patient was taking for a cyst on her back, which the cyst seems to better, patient daughter will call back at a later time if she think the cyst need attention.

## 2023-04-06 ENCOUNTER — Ambulatory Visit
Admission: RE | Admit: 2023-04-06 | Discharge: 2023-04-06 | Disposition: A | Discharge status: Home / Self Care | Payer: Medicare Other | Source: Ambulatory Visit | Attending: Nephrology | Admitting: Nephrology

## 2023-04-06 DIAGNOSIS — N179 Acute kidney failure, unspecified: Secondary | ICD-10-CM | POA: Diagnosis not present

## 2023-04-06 DIAGNOSIS — N1831 Chronic kidney disease, stage 3a: Secondary | ICD-10-CM | POA: Insufficient documentation

## 2023-04-06 DIAGNOSIS — N189 Chronic kidney disease, unspecified: Secondary | ICD-10-CM | POA: Diagnosis not present

## 2023-04-26 DIAGNOSIS — I10 Essential (primary) hypertension: Secondary | ICD-10-CM | POA: Diagnosis not present

## 2023-04-27 ENCOUNTER — Other Ambulatory Visit: Payer: Self-pay | Admitting: Nephrology

## 2023-04-27 DIAGNOSIS — N179 Acute kidney failure, unspecified: Secondary | ICD-10-CM | POA: Diagnosis not present

## 2023-04-27 DIAGNOSIS — N2889 Other specified disorders of kidney and ureter: Secondary | ICD-10-CM

## 2023-04-27 DIAGNOSIS — N1831 Chronic kidney disease, stage 3a: Secondary | ICD-10-CM | POA: Diagnosis not present

## 2023-04-27 DIAGNOSIS — D631 Anemia in chronic kidney disease: Secondary | ICD-10-CM | POA: Diagnosis not present

## 2023-04-27 DIAGNOSIS — I10 Essential (primary) hypertension: Secondary | ICD-10-CM | POA: Diagnosis not present

## 2023-04-29 DIAGNOSIS — I129 Hypertensive chronic kidney disease with stage 1 through stage 4 chronic kidney disease, or unspecified chronic kidney disease: Secondary | ICD-10-CM | POA: Diagnosis not present

## 2023-05-04 ENCOUNTER — Ambulatory Visit
Admission: RE | Admit: 2023-05-04 | Discharge: 2023-05-04 | Disposition: A | Discharge status: Home / Self Care | Payer: Medicare Other | Source: Ambulatory Visit | Attending: Nephrology | Admitting: Nephrology

## 2023-05-04 DIAGNOSIS — N289 Disorder of kidney and ureter, unspecified: Secondary | ICD-10-CM | POA: Diagnosis not present

## 2023-05-04 DIAGNOSIS — N2889 Other specified disorders of kidney and ureter: Secondary | ICD-10-CM | POA: Diagnosis not present

## 2023-05-04 DIAGNOSIS — I700 Atherosclerosis of aorta: Secondary | ICD-10-CM | POA: Diagnosis not present

## 2023-05-04 DIAGNOSIS — R932 Abnormal findings on diagnostic imaging of liver and biliary tract: Secondary | ICD-10-CM | POA: Diagnosis not present

## 2023-05-04 DIAGNOSIS — K5710 Diverticulosis of small intestine without perforation or abscess without bleeding: Secondary | ICD-10-CM | POA: Diagnosis not present

## 2023-05-04 DIAGNOSIS — I10 Essential (primary) hypertension: Secondary | ICD-10-CM | POA: Diagnosis not present

## 2023-05-04 MED ORDER — GADOBUTROL 1 MMOL/ML IV SOLN
7.5000 mL | Freq: Once | INTRAVENOUS | Status: AC | PRN
  Administered 2023-05-04: 7.5 mL via INTRAVENOUS

## 2023-05-31 DIAGNOSIS — N179 Acute kidney failure, unspecified: Secondary | ICD-10-CM | POA: Diagnosis not present

## 2023-05-31 DIAGNOSIS — N1831 Chronic kidney disease, stage 3a: Secondary | ICD-10-CM | POA: Diagnosis not present

## 2023-05-31 DIAGNOSIS — D631 Anemia in chronic kidney disease: Secondary | ICD-10-CM | POA: Diagnosis not present

## 2023-05-31 DIAGNOSIS — I10 Essential (primary) hypertension: Secondary | ICD-10-CM | POA: Diagnosis not present

## 2023-06-03 DIAGNOSIS — N2889 Other specified disorders of kidney and ureter: Secondary | ICD-10-CM | POA: Diagnosis not present

## 2023-06-29 ENCOUNTER — Ambulatory Visit: Admitting: Urology

## 2023-06-29 ENCOUNTER — Encounter: Payer: Self-pay | Admitting: Urology

## 2023-06-29 VITALS — BP 149/81 | HR 59 | Ht 66.75 in | Wt 157.0 lb

## 2023-06-29 DIAGNOSIS — N289 Disorder of kidney and ureter, unspecified: Secondary | ICD-10-CM | POA: Diagnosis not present

## 2023-06-29 DIAGNOSIS — N3281 Overactive bladder: Secondary | ICD-10-CM | POA: Diagnosis not present

## 2023-06-29 DIAGNOSIS — N2889 Other specified disorders of kidney and ureter: Secondary | ICD-10-CM | POA: Insufficient documentation

## 2023-06-29 MED ORDER — MIRABEGRON ER 50 MG PO TB24: 50.0000 mg | ORAL_TABLET | Freq: Every day | ORAL | Status: DC

## 2023-06-29 NOTE — Progress Notes (Signed)
   06/29/23 3:34 PM   Chelsea Hart Aug 06, 1933 [id number]  CC: Left renal mass, urinary symptoms  HPI: 89 year old comorbid frail female with CKD referred for the above issues.  Recent renal ultrasound showed a 2 cm left lower pole lesion and a follow-up MRI was ordered showing a 2 cm lesion with low-level enhancement consistent with papillary RCC.  Review of prior CT scan from 2023 showed similar size lesion.  Her primary issue today is urinary urgency and frequency with significant urge incontinence both day and night.  She has never tried any medications for this.  She denies any dysuria.  History is obtained almost exclusively from her daughter today.Recent renal function creatinine 1.34, EGFR 38.   PMH: Past Medical History:  Diagnosis Date   Anxiety    Depression    Hypercholesterolemia    Hypertension    Knee pain    Uterine cancer [facility])     Surgical History: Past Surgical History:  Procedure Laterality Date   ABDOMINAL HYSTERECTOMY     BREAST SURGERY     HIP PINNING,CANNULATED Left 03/28/2021   Procedure: CANNULATED HIP PINNING;  Surgeon: Molli Angelucci, MD;  Location: [facility] ORS;  Service: Orthopedics;  Laterality: Left;   MIDDLE EAR SURGERY     NOSE SURGERY     TEE WITHOUT CARDIOVERSION N/A 04/18/2021   Procedure: TRANSESOPHAGEAL ECHOCARDIOGRAM (TEE);  Surgeon: Michelle Aid, MD;  Location: [facility] ORS;  Service: Cardiovascular;  Laterality: N/A;    Family History: No family history on file.  Social History:  reports that she has quit smoking. Her smoking use included cigarettes. She has never used smokeless tobacco. She reports that she does not drink alcohol  and does not use drugs.  Physical Exam: BP (!) 149/81   Pulse (!) 59   Ht 5' 6.75" (1.695 m)   Wt 157 lb (71.2 kg)   BMI 24.77 kg/m    Constitutional:  Alert and oriented, No acute distress. Cardiovascular: No clubbing, cyanosis, or edema. Respiratory: Normal respiratory effort, no increased work of  breathing. GI: Abdomen is soft, nontender, nondistended, no abdominal masses   Laboratory Data: Reviewed, see HPI  Pertinent Imaging: I have personally viewed and interpreted the CT from 2023, and recent renal ultrasound and MRI showing a stable 2 cm left lower pole enhancing lesion consistent with likely indolent papillary RCC.  Assessment & Plan:   Comorbid and frail 89 year old female with stable 2 cm left renal mass and worsening urinary symptoms with urgency, frequency, and urge incontinence.  Reassurance provided regarding stability of small left renal mass, and in the setting of her CKD and age would strongly recommend active surveillance and can monitor with yearly ultrasound.  We discussed extremely low risk of developing symptoms or metastatic disease.  She is not interested in more invasive treatments.  Regarding her urinary symptoms, her symptoms sound primarily overactive in nature and I recommended a trial of Myrbetriq.  Samples were given, risks and benefits discussed.  Not a candidate for anticholinergics with her age, frailty, fall risk.  Could consider PTNS if minimal response to Myrbetriq.  Active surveillance for small left 2 cm renal mass, repeat renal ultrasound 1 year Trial of Myrbetriq for OAB symptoms, RTC PA 4 weeks symptom check, UA and PVR   Jay Meth, MD 06/29/2023  [facility] [address], [address] [place], [place] [address] [phone]

## 2023-06-30 DIAGNOSIS — I10 Essential (primary) hypertension: Secondary | ICD-10-CM | POA: Diagnosis not present

## 2023-07-01 ENCOUNTER — Other Ambulatory Visit: Payer: Self-pay

## 2023-07-01 DIAGNOSIS — F419 Anxiety disorder, unspecified: Secondary | ICD-10-CM | POA: Diagnosis not present

## 2023-07-01 DIAGNOSIS — N289 Disorder of kidney and ureter, unspecified: Secondary | ICD-10-CM

## 2023-07-28 DIAGNOSIS — I10 Essential (primary) hypertension: Secondary | ICD-10-CM | POA: Diagnosis not present

## 2023-07-29 DIAGNOSIS — L299 Pruritus, unspecified: Secondary | ICD-10-CM | POA: Diagnosis not present

## 2023-07-29 DIAGNOSIS — R54 Age-related physical debility: Secondary | ICD-10-CM | POA: Diagnosis not present

## 2023-07-29 DIAGNOSIS — I10 Essential (primary) hypertension: Secondary | ICD-10-CM | POA: Diagnosis not present

## 2023-08-02 ENCOUNTER — Other Ambulatory Visit: Payer: Self-pay

## 2023-08-02 ENCOUNTER — Encounter: Payer: Self-pay | Admitting: Urology

## 2023-08-02 ENCOUNTER — Other Ambulatory Visit
Admission: RE | Admit: 2023-08-02 | Discharge: 2023-08-02 | Disposition: A | Discharge status: Home / Self Care | Attending: Urology | Admitting: Urology

## 2023-08-02 ENCOUNTER — Ambulatory Visit: Admitting: Urology

## 2023-08-02 VITALS — BP 182/83 | HR 72 | Ht 66.75 in | Wt 156.0 lb

## 2023-08-02 DIAGNOSIS — N289 Disorder of kidney and ureter, unspecified: Secondary | ICD-10-CM | POA: Insufficient documentation

## 2023-08-02 DIAGNOSIS — N3281 Overactive bladder: Secondary | ICD-10-CM

## 2023-08-02 LAB — URINALYSIS, COMPLETE (UACMP) WITH MICROSCOPIC
Bilirubin Urine: NEGATIVE
Glucose, UA: NEGATIVE mg/dL
Hgb urine dipstick: NEGATIVE
Ketones, ur: NEGATIVE mg/dL
Nitrite: NEGATIVE
Protein, ur: 100 mg/dL — AB
Specific Gravity, Urine: 1.025 (ref 1.005–1.030)
pH: 6 (ref 5.0–8.0)

## 2023-08-02 LAB — BLADDER SCAN AMB NON-IMAGING

## 2023-08-02 MED ORDER — MIRABEGRON ER 50 MG PO TB24: 50.0000 mg | ORAL_TABLET | Freq: Every day | ORAL | 3 refills | Status: DC

## 2023-08-02 NOTE — Progress Notes (Signed)
 08/02/2023 4:07 PM   Ryelle Larey Plenty 1933/09/12 [id number]  Referring provider: [facility] [address] [place],  [place] [address]  Urological history: 1.  Renal mass - MRI (05/2023) -complex 2.1 x 2.0 cm left renal mass consistent with RCC - on AS - has appointment with in April 2026  2. Incontinence - managed with depends  Chief Complaint  Patient presents with   Over Active Bladder    HPI: Chelsea Hart is a 89 y.o. woman who presents today for OAB with her daughter, Brian Campanile.    Previous records reviewed.   She was seen by Dr. Estanislao Heimlich in June 29, 2023 for a renal mass and symptoms of urgency, frequency and urge incontinence.  They are pursuing active surveillance for the renal mass.  She was given Myrbetriq  50 mg daily to address her urinary issues.    Her daughter provides most of the history and states they have help with bowel movements, but they have not seen a difference in her urinary symptoms.  Patient denies any modifying or aggravating factors.  Patient denies any recent UTI's, gross hematuria, dysuria or suprapubic/flank pain.  Patient denies any fevers, chills, nausea or vomiting.    UA is yellow hazy, specific gravity 1.025, pH 6.0, 100 protein, trace leukocyte, 0-10 squamous epithelial cells, 21-50 WBCs, 0-5 RBCs, few bacteria and WBC clumps present.    PMH: Past Medical History:  Diagnosis Date   Anxiety    Depression    Hypercholesterolemia    Hypertension    Knee pain    Uterine cancer [facility])     Surgical History: Past Surgical History:  Procedure Laterality Date   ABDOMINAL HYSTERECTOMY     BREAST SURGERY     HIP PINNING,CANNULATED Left 03/28/2021   Procedure: CANNULATED HIP PINNING;  Surgeon: Molli Angelucci, MD;  Location: [facility] ORS;  Service: Orthopedics;  Laterality: Left;   MIDDLE EAR SURGERY     NOSE SURGERY     TEE WITHOUT CARDIOVERSION N/A 04/18/2021   Procedure: TRANSESOPHAGEAL ECHOCARDIOGRAM (TEE);  Surgeon: Michelle Aid, MD;  Location: [facility] ORS;  Service: Cardiovascular;  Laterality: N/A;    Home Medications:  Allergies as of 08/02/2023       Reactions   Codeine    Doxepin    Penicillins         Medication List        Accurate as of August 02, 2023  4:07 PM. If you have any questions, ask your nurse or doctor.          STOP taking these medications    doxycycline  100 MG capsule Commonly known as: MONODOX  Stopped by: Cathleen Coach Terrel Nesheiwat       TAKE these medications    ALPRAZolam  0.25 MG tablet Commonly known as: XANAX  Take 0.25 mg by mouth. What changed: Another medication with the same name was removed. Continue taking this medication, and follow the directions you see here. Changed by: Idella Lamontagne   ascorbic acid  500 MG tablet Commonly known as: VITAMIN C Take 500 mg by mouth daily.   aspirin  EC 81 MG tablet Take 81 mg by mouth daily.   atorvastatin  40 MG tablet Commonly known as: LIPITOR Take 1 tablet (40 mg total) by mouth daily.   calcium  carbonate 1500 (600 Ca) MG Tabs tablet Commonly known as: OSCAL Take 1 tablet by mouth daily.   Cholecalciferol  50 MCG (2000 UT) Tabs Take 2,000 Units by mouth daily.   Ciclopirox  1 % shampoo  apply three times per week, massage into scalp and leave in for 3-5 minutes before rinsing out   clobetasol  0.05 % external solution Commonly known as: TEMOVATE  APPLY TO THE AFFECTED AREA 3 DAYS A WEEK AFTER WASHING HAIR AS DIRECTED   feeding supplement Liqd Take 237 mLs by mouth 2 (two) times daily between meals.   fenofibrate  54 MG tablet Take 54 mg by mouth daily.   hydrOXYzine 25 MG tablet Commonly known as: ATARAX Take 25 mg by mouth.   ketoconazole  2 % shampoo Commonly known as: NIZORAL  apply three times per week, massage into scalp and leave in for 10 minutes before rinsing out   latanoprost  0.005 % ophthalmic solution Commonly known as: XALATAN  Place 1 drop into both eyes at bedtime.   lisinopril 10 MG  tablet Commonly known as: ZESTRIL Take 10 mg by mouth daily.   melatonin 3 MG Tabs tablet Take 3 mg by mouth at bedtime.   metoprolol  succinate 25 MG 24 hr tablet Commonly known as: Toprol  XL Take 1 tablet (25 mg total) by mouth daily.   mirabegron  ER 50 MG Tb24 tablet Commonly known as: MYRBETRIQ  Take 1 tablet (50 mg total) by mouth daily.   PARoxetine  40 MG tablet Commonly known as: PAXIL  Take 40 mg by mouth daily.   Polyethyl Glyc-Propyl Glyc PF 0.4-0.3 % Soln Apply 1-2 drops to eye 2 (two) times daily as needed.   polyethylene glycol 17 g packet Commonly known as: MIRALAX / GLYCOLAX Take 17 g by mouth daily.   sennosides-docusate sodium  8.6-50 MG tablet Commonly known as: SENOKOT-S Take 1 tablet by mouth daily.        Allergies:  Allergies  Allergen Reactions   Codeine    Doxepin    Penicillins     Family History: No family history on file.  Social History:  reports that she has quit smoking. Her smoking use included cigarettes. She has never used smokeless tobacco. She reports that she does not drink alcohol  and does not use drugs.  ROS: Pertinent ROS in HPI  Physical Exam: BP (!) 182/83 (BP Location: Left Arm, Patient Position: Sitting, Cuff Size: Large)   Pulse 72   Ht 5' 6.75" (1.695 m)   Wt 156 lb (70.8 kg)   SpO2 95%   BMI 24.62 kg/m   Constitutional:  Well nourished. Alert and oriented, No acute distress. HEENT: [place] AT, moist mucus membranes.  Trachea midline Cardiovascular: No clubbing, cyanosis, or edema. Respiratory: Normal respiratory effort, no increased work of breathing. Neurologic: Grossly intact, no focal deficits, moving all 4 extremities. Psychiatric: Normal mood and affect.    Laboratory Data: Renal Function Panel Order: [id number] Component Ref Range & Units 2 mo ago  Glucose 65 - 99 mg/dL 88  Comment:                                                                                             Fasting reference interval  BUN 7 - 25 mg/dL 15  Creatinine 7.84 - 6.96 mg/dL 2.95 High   eGFR CKD-EPI CR 2021 > OR = 60 mL/min/1.53m2 38 Low   BUN/Creatinine Ratio 6 - 22 (calc) 11  Sodium 135 - 146 mmol/L 139  Potassium 3.5 - 5.3 mmol/L 3.8  Chloride 98 - 110 mmol/L 104  Bicarbonate (CO2) 20 - 32 mmol/L 23  Calcium  8.6 - 10.4 mg/dL 28.4  Phosphorus 2.1 - 4.3 mg/dL 3.8  Albumin 3.6 - 5.1 g/dL 4  Resulting Agency [facility]   Specimen Collected: 05/31/23 13:34   Performed by: Stacie Dys Last Resulted: 06/01/23 09:56  Received From: [facility]  Result Received: 08/01/23 20:08    Urinalysis See EPIC and HPI  I have reviewed the labs.   Pertinent Imaging:  08/02/23 15:18  Scan Result 18mL   Assessment & Plan:    1. OAB (overactive bladder) (Primary) - UA w/ pyuria -likely contaminated, but will send for culture to see if there is any indolent infection resulting in her symptoms - Bladder Scan (Post Void Residual) in office, demonstrates adequate bladder emptying - explained that the Myrbetriq  may take longer to be effective, bladder and they would like to give it more time - Refill sent in for the Myrbetriq   2. Renal mass - Has follow-up in April with Dr. Estanislao Heimlich for surveillance imaging  Return for pending urine culture results .  These notes generated with voice recognition software. I apologize for typographical errors.  Briant Camper  [facility] [address]  [address] [place], [place] [address] [phone]

## 2023-08-03 LAB — URINE CULTURE

## 2023-08-04 ENCOUNTER — Other Ambulatory Visit: Payer: Self-pay

## 2023-08-04 ENCOUNTER — Ambulatory Visit: Payer: Self-pay | Admitting: Urology

## 2023-08-04 DIAGNOSIS — N3281 Overactive bladder: Secondary | ICD-10-CM

## 2023-08-04 MED ORDER — MIRABEGRON ER 50 MG PO TB24: 50.0000 mg | ORAL_TABLET | Freq: Every day | ORAL | 3 refills | Status: DC

## 2023-08-04 NOTE — Addendum Note (Signed)
 Addended byKatina Parlor on: 08/04/2023 05:09 PM   Modules accepted: Orders

## 2023-08-09 ENCOUNTER — Other Ambulatory Visit
Admission: RE | Admit: 2023-08-09 | Discharge: 2023-08-09 | Disposition: A | Discharge status: Home / Self Care | Attending: Urology | Admitting: Urology

## 2023-08-09 DIAGNOSIS — I129 Hypertensive chronic kidney disease with stage 1 through stage 4 chronic kidney disease, or unspecified chronic kidney disease: Secondary | ICD-10-CM | POA: Diagnosis not present

## 2023-08-09 DIAGNOSIS — F329 Major depressive disorder, single episode, unspecified: Secondary | ICD-10-CM | POA: Diagnosis not present

## 2023-08-09 DIAGNOSIS — L299 Pruritus, unspecified: Secondary | ICD-10-CM | POA: Diagnosis not present

## 2023-08-09 DIAGNOSIS — N1832 Chronic kidney disease, stage 3b: Secondary | ICD-10-CM | POA: Diagnosis not present

## 2023-08-09 DIAGNOSIS — D631 Anemia in chronic kidney disease: Secondary | ICD-10-CM | POA: Diagnosis not present

## 2023-08-09 DIAGNOSIS — Z85528 Personal history of other malignant neoplasm of kidney: Secondary | ICD-10-CM | POA: Diagnosis not present

## 2023-08-09 DIAGNOSIS — Z87440 Personal history of urinary (tract) infections: Secondary | ICD-10-CM | POA: Diagnosis not present

## 2023-08-09 DIAGNOSIS — R350 Frequency of micturition: Secondary | ICD-10-CM | POA: Insufficient documentation

## 2023-08-09 DIAGNOSIS — Z556 Problems related to health literacy: Secondary | ICD-10-CM | POA: Diagnosis not present

## 2023-08-09 DIAGNOSIS — I2510 Atherosclerotic heart disease of native coronary artery without angina pectoris: Secondary | ICD-10-CM | POA: Diagnosis not present

## 2023-08-09 DIAGNOSIS — K5900 Constipation, unspecified: Secondary | ICD-10-CM | POA: Diagnosis not present

## 2023-08-09 DIAGNOSIS — F419 Anxiety disorder, unspecified: Secondary | ICD-10-CM | POA: Diagnosis not present

## 2023-08-09 DIAGNOSIS — N3281 Overactive bladder: Secondary | ICD-10-CM | POA: Diagnosis not present

## 2023-08-09 DIAGNOSIS — Z9181 History of falling: Secondary | ICD-10-CM | POA: Diagnosis not present

## 2023-08-09 DIAGNOSIS — Z8542 Personal history of malignant neoplasm of other parts of uterus: Secondary | ICD-10-CM | POA: Diagnosis not present

## 2023-08-09 DIAGNOSIS — Z8673 Personal history of transient ischemic attack (TIA), and cerebral infarction without residual deficits: Secondary | ICD-10-CM | POA: Diagnosis not present

## 2023-08-09 DIAGNOSIS — Z90710 Acquired absence of both cervix and uterus: Secondary | ICD-10-CM | POA: Diagnosis not present

## 2023-08-09 DIAGNOSIS — Z7982 Long term (current) use of aspirin: Secondary | ICD-10-CM | POA: Diagnosis not present

## 2023-08-09 DIAGNOSIS — Z87891 Personal history of nicotine dependence: Secondary | ICD-10-CM | POA: Diagnosis not present

## 2023-08-09 DIAGNOSIS — Z5982 Transportation insecurity: Secondary | ICD-10-CM | POA: Diagnosis not present

## 2023-08-09 DIAGNOSIS — E7800 Pure hypercholesterolemia, unspecified: Secondary | ICD-10-CM | POA: Diagnosis not present

## 2023-08-09 DIAGNOSIS — Z8781 Personal history of (healed) traumatic fracture: Secondary | ICD-10-CM | POA: Diagnosis not present

## 2023-08-09 DIAGNOSIS — K219 Gastro-esophageal reflux disease without esophagitis: Secondary | ICD-10-CM | POA: Diagnosis not present

## 2023-08-09 LAB — URINALYSIS, COMPLETE (UACMP) WITH MICROSCOPIC
Bilirubin Urine: NEGATIVE
Glucose, UA: NEGATIVE mg/dL
Hgb urine dipstick: NEGATIVE
Ketones, ur: NEGATIVE mg/dL
Nitrite: NEGATIVE
Protein, ur: 100 mg/dL — AB
RBC / HPF: NONE SEEN RBC/hpf (ref 0–5)
Specific Gravity, Urine: 1.02 (ref 1.005–1.030)
pH: 7 (ref 5.0–8.0)

## 2023-08-10 ENCOUNTER — Ambulatory Visit: Payer: Self-pay | Admitting: Urology

## 2023-08-10 LAB — URINE CULTURE: Culture: NO GROWTH

## 2023-08-11 ENCOUNTER — Other Ambulatory Visit: Payer: Self-pay

## 2023-08-11 DIAGNOSIS — N3281 Overactive bladder: Secondary | ICD-10-CM

## 2023-08-11 MED ORDER — MIRABEGRON ER 50 MG PO TB24: 50.0000 mg | ORAL_TABLET | Freq: Every day | ORAL | 3 refills | Status: DC

## 2023-08-13 DIAGNOSIS — K5900 Constipation, unspecified: Secondary | ICD-10-CM | POA: Diagnosis not present

## 2023-08-13 DIAGNOSIS — F419 Anxiety disorder, unspecified: Secondary | ICD-10-CM | POA: Diagnosis not present

## 2023-08-13 DIAGNOSIS — E7800 Pure hypercholesterolemia, unspecified: Secondary | ICD-10-CM | POA: Diagnosis not present

## 2023-08-13 DIAGNOSIS — I2510 Atherosclerotic heart disease of native coronary artery without angina pectoris: Secondary | ICD-10-CM | POA: Diagnosis not present

## 2023-08-13 DIAGNOSIS — Z87440 Personal history of urinary (tract) infections: Secondary | ICD-10-CM | POA: Diagnosis not present

## 2023-08-13 DIAGNOSIS — N1832 Chronic kidney disease, stage 3b: Secondary | ICD-10-CM | POA: Diagnosis not present

## 2023-08-13 DIAGNOSIS — Z8673 Personal history of transient ischemic attack (TIA), and cerebral infarction without residual deficits: Secondary | ICD-10-CM | POA: Diagnosis not present

## 2023-08-13 DIAGNOSIS — Z9181 History of falling: Secondary | ICD-10-CM | POA: Diagnosis not present

## 2023-08-13 DIAGNOSIS — Z8781 Personal history of (healed) traumatic fracture: Secondary | ICD-10-CM | POA: Diagnosis not present

## 2023-08-13 DIAGNOSIS — R350 Frequency of micturition: Secondary | ICD-10-CM | POA: Diagnosis not present

## 2023-08-13 DIAGNOSIS — K219 Gastro-esophageal reflux disease without esophagitis: Secondary | ICD-10-CM | POA: Diagnosis not present

## 2023-08-13 DIAGNOSIS — Z5982 Transportation insecurity: Secondary | ICD-10-CM | POA: Diagnosis not present

## 2023-08-13 DIAGNOSIS — D631 Anemia in chronic kidney disease: Secondary | ICD-10-CM | POA: Diagnosis not present

## 2023-08-13 DIAGNOSIS — I129 Hypertensive chronic kidney disease with stage 1 through stage 4 chronic kidney disease, or unspecified chronic kidney disease: Secondary | ICD-10-CM | POA: Diagnosis not present

## 2023-08-13 DIAGNOSIS — Z90710 Acquired absence of both cervix and uterus: Secondary | ICD-10-CM | POA: Diagnosis not present

## 2023-08-13 DIAGNOSIS — Z8542 Personal history of malignant neoplasm of other parts of uterus: Secondary | ICD-10-CM | POA: Diagnosis not present

## 2023-08-13 DIAGNOSIS — L299 Pruritus, unspecified: Secondary | ICD-10-CM | POA: Diagnosis not present

## 2023-08-13 DIAGNOSIS — Z7982 Long term (current) use of aspirin: Secondary | ICD-10-CM | POA: Diagnosis not present

## 2023-08-13 DIAGNOSIS — F329 Major depressive disorder, single episode, unspecified: Secondary | ICD-10-CM | POA: Diagnosis not present

## 2023-08-13 DIAGNOSIS — Z87891 Personal history of nicotine dependence: Secondary | ICD-10-CM | POA: Diagnosis not present

## 2023-08-13 DIAGNOSIS — Z85528 Personal history of other malignant neoplasm of kidney: Secondary | ICD-10-CM | POA: Diagnosis not present

## 2023-08-13 DIAGNOSIS — Z556 Problems related to health literacy: Secondary | ICD-10-CM | POA: Diagnosis not present

## 2023-08-16 DIAGNOSIS — K219 Gastro-esophageal reflux disease without esophagitis: Secondary | ICD-10-CM | POA: Diagnosis not present

## 2023-08-16 DIAGNOSIS — I2510 Atherosclerotic heart disease of native coronary artery without angina pectoris: Secondary | ICD-10-CM | POA: Diagnosis not present

## 2023-08-16 DIAGNOSIS — K5900 Constipation, unspecified: Secondary | ICD-10-CM | POA: Diagnosis not present

## 2023-08-16 DIAGNOSIS — Z7982 Long term (current) use of aspirin: Secondary | ICD-10-CM | POA: Diagnosis not present

## 2023-08-16 DIAGNOSIS — Z9181 History of falling: Secondary | ICD-10-CM | POA: Diagnosis not present

## 2023-08-16 DIAGNOSIS — Z5982 Transportation insecurity: Secondary | ICD-10-CM | POA: Diagnosis not present

## 2023-08-16 DIAGNOSIS — Z90710 Acquired absence of both cervix and uterus: Secondary | ICD-10-CM | POA: Diagnosis not present

## 2023-08-16 DIAGNOSIS — F419 Anxiety disorder, unspecified: Secondary | ICD-10-CM | POA: Diagnosis not present

## 2023-08-16 DIAGNOSIS — Z85528 Personal history of other malignant neoplasm of kidney: Secondary | ICD-10-CM | POA: Diagnosis not present

## 2023-08-16 DIAGNOSIS — I129 Hypertensive chronic kidney disease with stage 1 through stage 4 chronic kidney disease, or unspecified chronic kidney disease: Secondary | ICD-10-CM | POA: Diagnosis not present

## 2023-08-16 DIAGNOSIS — Z87440 Personal history of urinary (tract) infections: Secondary | ICD-10-CM | POA: Diagnosis not present

## 2023-08-16 DIAGNOSIS — D631 Anemia in chronic kidney disease: Secondary | ICD-10-CM | POA: Diagnosis not present

## 2023-08-16 DIAGNOSIS — Z556 Problems related to health literacy: Secondary | ICD-10-CM | POA: Diagnosis not present

## 2023-08-16 DIAGNOSIS — N1832 Chronic kidney disease, stage 3b: Secondary | ICD-10-CM | POA: Diagnosis not present

## 2023-08-16 DIAGNOSIS — E7800 Pure hypercholesterolemia, unspecified: Secondary | ICD-10-CM | POA: Diagnosis not present

## 2023-08-16 DIAGNOSIS — F329 Major depressive disorder, single episode, unspecified: Secondary | ICD-10-CM | POA: Diagnosis not present

## 2023-08-16 DIAGNOSIS — Z8781 Personal history of (healed) traumatic fracture: Secondary | ICD-10-CM | POA: Diagnosis not present

## 2023-08-16 DIAGNOSIS — L299 Pruritus, unspecified: Secondary | ICD-10-CM | POA: Diagnosis not present

## 2023-08-16 DIAGNOSIS — R350 Frequency of micturition: Secondary | ICD-10-CM | POA: Diagnosis not present

## 2023-08-16 DIAGNOSIS — Z8542 Personal history of malignant neoplasm of other parts of uterus: Secondary | ICD-10-CM | POA: Diagnosis not present

## 2023-08-16 DIAGNOSIS — Z87891 Personal history of nicotine dependence: Secondary | ICD-10-CM | POA: Diagnosis not present

## 2023-08-16 DIAGNOSIS — Z8673 Personal history of transient ischemic attack (TIA), and cerebral infarction without residual deficits: Secondary | ICD-10-CM | POA: Diagnosis not present

## 2023-08-20 DIAGNOSIS — Z5982 Transportation insecurity: Secondary | ICD-10-CM | POA: Diagnosis not present

## 2023-08-20 DIAGNOSIS — Z85528 Personal history of other malignant neoplasm of kidney: Secondary | ICD-10-CM | POA: Diagnosis not present

## 2023-08-20 DIAGNOSIS — Z8781 Personal history of (healed) traumatic fracture: Secondary | ICD-10-CM | POA: Diagnosis not present

## 2023-08-20 DIAGNOSIS — E7800 Pure hypercholesterolemia, unspecified: Secondary | ICD-10-CM | POA: Diagnosis not present

## 2023-08-20 DIAGNOSIS — I2510 Atherosclerotic heart disease of native coronary artery without angina pectoris: Secondary | ICD-10-CM | POA: Diagnosis not present

## 2023-08-20 DIAGNOSIS — F329 Major depressive disorder, single episode, unspecified: Secondary | ICD-10-CM | POA: Diagnosis not present

## 2023-08-20 DIAGNOSIS — F419 Anxiety disorder, unspecified: Secondary | ICD-10-CM | POA: Diagnosis not present

## 2023-08-20 DIAGNOSIS — Z87891 Personal history of nicotine dependence: Secondary | ICD-10-CM | POA: Diagnosis not present

## 2023-08-20 DIAGNOSIS — Z556 Problems related to health literacy: Secondary | ICD-10-CM | POA: Diagnosis not present

## 2023-08-20 DIAGNOSIS — L299 Pruritus, unspecified: Secondary | ICD-10-CM | POA: Diagnosis not present

## 2023-08-20 DIAGNOSIS — Z7982 Long term (current) use of aspirin: Secondary | ICD-10-CM | POA: Diagnosis not present

## 2023-08-20 DIAGNOSIS — Z87440 Personal history of urinary (tract) infections: Secondary | ICD-10-CM | POA: Diagnosis not present

## 2023-08-20 DIAGNOSIS — N1832 Chronic kidney disease, stage 3b: Secondary | ICD-10-CM | POA: Diagnosis not present

## 2023-08-20 DIAGNOSIS — I10 Essential (primary) hypertension: Secondary | ICD-10-CM | POA: Diagnosis not present

## 2023-08-20 DIAGNOSIS — K5900 Constipation, unspecified: Secondary | ICD-10-CM | POA: Diagnosis not present

## 2023-08-20 DIAGNOSIS — R350 Frequency of micturition: Secondary | ICD-10-CM | POA: Diagnosis not present

## 2023-08-20 DIAGNOSIS — I129 Hypertensive chronic kidney disease with stage 1 through stage 4 chronic kidney disease, or unspecified chronic kidney disease: Secondary | ICD-10-CM | POA: Diagnosis not present

## 2023-08-20 DIAGNOSIS — K219 Gastro-esophageal reflux disease without esophagitis: Secondary | ICD-10-CM | POA: Diagnosis not present

## 2023-08-20 DIAGNOSIS — Z8673 Personal history of transient ischemic attack (TIA), and cerebral infarction without residual deficits: Secondary | ICD-10-CM | POA: Diagnosis not present

## 2023-08-20 DIAGNOSIS — D631 Anemia in chronic kidney disease: Secondary | ICD-10-CM | POA: Diagnosis not present

## 2023-08-20 DIAGNOSIS — Z8542 Personal history of malignant neoplasm of other parts of uterus: Secondary | ICD-10-CM | POA: Diagnosis not present

## 2023-08-20 DIAGNOSIS — Z90710 Acquired absence of both cervix and uterus: Secondary | ICD-10-CM | POA: Diagnosis not present

## 2023-08-20 DIAGNOSIS — Z9181 History of falling: Secondary | ICD-10-CM | POA: Diagnosis not present

## 2023-08-23 DIAGNOSIS — L299 Pruritus, unspecified: Secondary | ICD-10-CM | POA: Diagnosis not present

## 2023-08-23 DIAGNOSIS — E7800 Pure hypercholesterolemia, unspecified: Secondary | ICD-10-CM | POA: Diagnosis not present

## 2023-08-23 DIAGNOSIS — Z5982 Transportation insecurity: Secondary | ICD-10-CM | POA: Diagnosis not present

## 2023-08-23 DIAGNOSIS — N1832 Chronic kidney disease, stage 3b: Secondary | ICD-10-CM | POA: Diagnosis not present

## 2023-08-23 DIAGNOSIS — K219 Gastro-esophageal reflux disease without esophagitis: Secondary | ICD-10-CM | POA: Diagnosis not present

## 2023-08-23 DIAGNOSIS — I129 Hypertensive chronic kidney disease with stage 1 through stage 4 chronic kidney disease, or unspecified chronic kidney disease: Secondary | ICD-10-CM | POA: Diagnosis not present

## 2023-08-23 DIAGNOSIS — F419 Anxiety disorder, unspecified: Secondary | ICD-10-CM | POA: Diagnosis not present

## 2023-08-23 DIAGNOSIS — I2510 Atherosclerotic heart disease of native coronary artery without angina pectoris: Secondary | ICD-10-CM | POA: Diagnosis not present

## 2023-08-23 DIAGNOSIS — Z556 Problems related to health literacy: Secondary | ICD-10-CM | POA: Diagnosis not present

## 2023-08-23 DIAGNOSIS — Z85528 Personal history of other malignant neoplasm of kidney: Secondary | ICD-10-CM | POA: Diagnosis not present

## 2023-08-23 DIAGNOSIS — Z9181 History of falling: Secondary | ICD-10-CM | POA: Diagnosis not present

## 2023-08-23 DIAGNOSIS — Z8542 Personal history of malignant neoplasm of other parts of uterus: Secondary | ICD-10-CM | POA: Diagnosis not present

## 2023-08-23 DIAGNOSIS — Z7982 Long term (current) use of aspirin: Secondary | ICD-10-CM | POA: Diagnosis not present

## 2023-08-23 DIAGNOSIS — Z8673 Personal history of transient ischemic attack (TIA), and cerebral infarction without residual deficits: Secondary | ICD-10-CM | POA: Diagnosis not present

## 2023-08-23 DIAGNOSIS — Z90710 Acquired absence of both cervix and uterus: Secondary | ICD-10-CM | POA: Diagnosis not present

## 2023-08-23 DIAGNOSIS — F329 Major depressive disorder, single episode, unspecified: Secondary | ICD-10-CM | POA: Diagnosis not present

## 2023-08-23 DIAGNOSIS — Z87891 Personal history of nicotine dependence: Secondary | ICD-10-CM | POA: Diagnosis not present

## 2023-08-23 DIAGNOSIS — R350 Frequency of micturition: Secondary | ICD-10-CM | POA: Diagnosis not present

## 2023-08-23 DIAGNOSIS — Z8781 Personal history of (healed) traumatic fracture: Secondary | ICD-10-CM | POA: Diagnosis not present

## 2023-08-23 DIAGNOSIS — Z87440 Personal history of urinary (tract) infections: Secondary | ICD-10-CM | POA: Diagnosis not present

## 2023-08-23 DIAGNOSIS — D631 Anemia in chronic kidney disease: Secondary | ICD-10-CM | POA: Diagnosis not present

## 2023-08-23 DIAGNOSIS — K5900 Constipation, unspecified: Secondary | ICD-10-CM | POA: Diagnosis not present

## 2023-08-25 DIAGNOSIS — K219 Gastro-esophageal reflux disease without esophagitis: Secondary | ICD-10-CM | POA: Diagnosis not present

## 2023-08-25 DIAGNOSIS — Z87891 Personal history of nicotine dependence: Secondary | ICD-10-CM | POA: Diagnosis not present

## 2023-08-25 DIAGNOSIS — Z7982 Long term (current) use of aspirin: Secondary | ICD-10-CM | POA: Diagnosis not present

## 2023-08-25 DIAGNOSIS — F419 Anxiety disorder, unspecified: Secondary | ICD-10-CM | POA: Diagnosis not present

## 2023-08-25 DIAGNOSIS — L299 Pruritus, unspecified: Secondary | ICD-10-CM | POA: Diagnosis not present

## 2023-08-25 DIAGNOSIS — E7800 Pure hypercholesterolemia, unspecified: Secondary | ICD-10-CM | POA: Diagnosis not present

## 2023-08-25 DIAGNOSIS — K5900 Constipation, unspecified: Secondary | ICD-10-CM | POA: Diagnosis not present

## 2023-08-25 DIAGNOSIS — F329 Major depressive disorder, single episode, unspecified: Secondary | ICD-10-CM | POA: Diagnosis not present

## 2023-08-25 DIAGNOSIS — Z8673 Personal history of transient ischemic attack (TIA), and cerebral infarction without residual deficits: Secondary | ICD-10-CM | POA: Diagnosis not present

## 2023-08-25 DIAGNOSIS — Z90710 Acquired absence of both cervix and uterus: Secondary | ICD-10-CM | POA: Diagnosis not present

## 2023-08-25 DIAGNOSIS — R350 Frequency of micturition: Secondary | ICD-10-CM | POA: Diagnosis not present

## 2023-08-25 DIAGNOSIS — N1832 Chronic kidney disease, stage 3b: Secondary | ICD-10-CM | POA: Diagnosis not present

## 2023-08-25 DIAGNOSIS — Z87440 Personal history of urinary (tract) infections: Secondary | ICD-10-CM | POA: Diagnosis not present

## 2023-08-25 DIAGNOSIS — Z556 Problems related to health literacy: Secondary | ICD-10-CM | POA: Diagnosis not present

## 2023-08-25 DIAGNOSIS — Z85528 Personal history of other malignant neoplasm of kidney: Secondary | ICD-10-CM | POA: Diagnosis not present

## 2023-08-25 DIAGNOSIS — I2510 Atherosclerotic heart disease of native coronary artery without angina pectoris: Secondary | ICD-10-CM | POA: Diagnosis not present

## 2023-08-25 DIAGNOSIS — Z5982 Transportation insecurity: Secondary | ICD-10-CM | POA: Diagnosis not present

## 2023-08-25 DIAGNOSIS — Z9181 History of falling: Secondary | ICD-10-CM | POA: Diagnosis not present

## 2023-08-25 DIAGNOSIS — D631 Anemia in chronic kidney disease: Secondary | ICD-10-CM | POA: Diagnosis not present

## 2023-08-25 DIAGNOSIS — I129 Hypertensive chronic kidney disease with stage 1 through stage 4 chronic kidney disease, or unspecified chronic kidney disease: Secondary | ICD-10-CM | POA: Diagnosis not present

## 2023-08-25 DIAGNOSIS — Z8781 Personal history of (healed) traumatic fracture: Secondary | ICD-10-CM | POA: Diagnosis not present

## 2023-08-25 DIAGNOSIS — Z8542 Personal history of malignant neoplasm of other parts of uterus: Secondary | ICD-10-CM | POA: Diagnosis not present

## 2023-08-26 DIAGNOSIS — I2510 Atherosclerotic heart disease of native coronary artery without angina pectoris: Secondary | ICD-10-CM | POA: Diagnosis not present

## 2023-08-26 DIAGNOSIS — D631 Anemia in chronic kidney disease: Secondary | ICD-10-CM | POA: Diagnosis not present

## 2023-08-26 DIAGNOSIS — Z8542 Personal history of malignant neoplasm of other parts of uterus: Secondary | ICD-10-CM | POA: Diagnosis not present

## 2023-08-26 DIAGNOSIS — I129 Hypertensive chronic kidney disease with stage 1 through stage 4 chronic kidney disease, or unspecified chronic kidney disease: Secondary | ICD-10-CM | POA: Diagnosis not present

## 2023-08-26 DIAGNOSIS — K5900 Constipation, unspecified: Secondary | ICD-10-CM | POA: Diagnosis not present

## 2023-08-26 DIAGNOSIS — F419 Anxiety disorder, unspecified: Secondary | ICD-10-CM | POA: Diagnosis not present

## 2023-08-26 DIAGNOSIS — Z87440 Personal history of urinary (tract) infections: Secondary | ICD-10-CM | POA: Diagnosis not present

## 2023-08-26 DIAGNOSIS — Z87891 Personal history of nicotine dependence: Secondary | ICD-10-CM | POA: Diagnosis not present

## 2023-08-26 DIAGNOSIS — Z8781 Personal history of (healed) traumatic fracture: Secondary | ICD-10-CM | POA: Diagnosis not present

## 2023-08-26 DIAGNOSIS — N1832 Chronic kidney disease, stage 3b: Secondary | ICD-10-CM | POA: Diagnosis not present

## 2023-08-26 DIAGNOSIS — K219 Gastro-esophageal reflux disease without esophagitis: Secondary | ICD-10-CM | POA: Diagnosis not present

## 2023-08-26 DIAGNOSIS — Z85528 Personal history of other malignant neoplasm of kidney: Secondary | ICD-10-CM | POA: Diagnosis not present

## 2023-08-26 DIAGNOSIS — Z7982 Long term (current) use of aspirin: Secondary | ICD-10-CM | POA: Diagnosis not present

## 2023-08-26 DIAGNOSIS — Z5982 Transportation insecurity: Secondary | ICD-10-CM | POA: Diagnosis not present

## 2023-08-26 DIAGNOSIS — Z556 Problems related to health literacy: Secondary | ICD-10-CM | POA: Diagnosis not present

## 2023-08-26 DIAGNOSIS — L299 Pruritus, unspecified: Secondary | ICD-10-CM | POA: Diagnosis not present

## 2023-08-26 DIAGNOSIS — E7800 Pure hypercholesterolemia, unspecified: Secondary | ICD-10-CM | POA: Diagnosis not present

## 2023-08-26 DIAGNOSIS — F329 Major depressive disorder, single episode, unspecified: Secondary | ICD-10-CM | POA: Diagnosis not present

## 2023-08-26 DIAGNOSIS — Z9181 History of falling: Secondary | ICD-10-CM | POA: Diagnosis not present

## 2023-08-26 DIAGNOSIS — R350 Frequency of micturition: Secondary | ICD-10-CM | POA: Diagnosis not present

## 2023-08-26 DIAGNOSIS — Z8673 Personal history of transient ischemic attack (TIA), and cerebral infarction without residual deficits: Secondary | ICD-10-CM | POA: Diagnosis not present

## 2023-08-26 DIAGNOSIS — Z90710 Acquired absence of both cervix and uterus: Secondary | ICD-10-CM | POA: Diagnosis not present

## 2023-08-30 DIAGNOSIS — H43813 Vitreous degeneration, bilateral: Secondary | ICD-10-CM | POA: Diagnosis not present

## 2023-08-30 DIAGNOSIS — H401121 Primary open-angle glaucoma, left eye, mild stage: Secondary | ICD-10-CM | POA: Diagnosis not present

## 2023-08-30 DIAGNOSIS — H401112 Primary open-angle glaucoma, right eye, moderate stage: Secondary | ICD-10-CM | POA: Diagnosis not present

## 2023-08-30 DIAGNOSIS — Z961 Presence of intraocular lens: Secondary | ICD-10-CM | POA: Diagnosis not present

## 2023-08-31 DIAGNOSIS — R350 Frequency of micturition: Secondary | ICD-10-CM | POA: Diagnosis not present

## 2023-08-31 DIAGNOSIS — L299 Pruritus, unspecified: Secondary | ICD-10-CM | POA: Diagnosis not present

## 2023-08-31 DIAGNOSIS — Z7982 Long term (current) use of aspirin: Secondary | ICD-10-CM | POA: Diagnosis not present

## 2023-08-31 DIAGNOSIS — I129 Hypertensive chronic kidney disease with stage 1 through stage 4 chronic kidney disease, or unspecified chronic kidney disease: Secondary | ICD-10-CM | POA: Diagnosis not present

## 2023-08-31 DIAGNOSIS — F419 Anxiety disorder, unspecified: Secondary | ICD-10-CM | POA: Diagnosis not present

## 2023-08-31 DIAGNOSIS — K5900 Constipation, unspecified: Secondary | ICD-10-CM | POA: Diagnosis not present

## 2023-08-31 DIAGNOSIS — Z90710 Acquired absence of both cervix and uterus: Secondary | ICD-10-CM | POA: Diagnosis not present

## 2023-08-31 DIAGNOSIS — Z556 Problems related to health literacy: Secondary | ICD-10-CM | POA: Diagnosis not present

## 2023-08-31 DIAGNOSIS — I2510 Atherosclerotic heart disease of native coronary artery without angina pectoris: Secondary | ICD-10-CM | POA: Diagnosis not present

## 2023-08-31 DIAGNOSIS — Z8673 Personal history of transient ischemic attack (TIA), and cerebral infarction without residual deficits: Secondary | ICD-10-CM | POA: Diagnosis not present

## 2023-08-31 DIAGNOSIS — Z9181 History of falling: Secondary | ICD-10-CM | POA: Diagnosis not present

## 2023-08-31 DIAGNOSIS — Z8781 Personal history of (healed) traumatic fracture: Secondary | ICD-10-CM | POA: Diagnosis not present

## 2023-08-31 DIAGNOSIS — N1832 Chronic kidney disease, stage 3b: Secondary | ICD-10-CM | POA: Diagnosis not present

## 2023-08-31 DIAGNOSIS — F329 Major depressive disorder, single episode, unspecified: Secondary | ICD-10-CM | POA: Diagnosis not present

## 2023-08-31 DIAGNOSIS — K219 Gastro-esophageal reflux disease without esophagitis: Secondary | ICD-10-CM | POA: Diagnosis not present

## 2023-08-31 DIAGNOSIS — E7800 Pure hypercholesterolemia, unspecified: Secondary | ICD-10-CM | POA: Diagnosis not present

## 2023-08-31 DIAGNOSIS — Z85528 Personal history of other malignant neoplasm of kidney: Secondary | ICD-10-CM | POA: Diagnosis not present

## 2023-08-31 DIAGNOSIS — Z87891 Personal history of nicotine dependence: Secondary | ICD-10-CM | POA: Diagnosis not present

## 2023-08-31 DIAGNOSIS — Z8542 Personal history of malignant neoplasm of other parts of uterus: Secondary | ICD-10-CM | POA: Diagnosis not present

## 2023-08-31 DIAGNOSIS — Z5982 Transportation insecurity: Secondary | ICD-10-CM | POA: Diagnosis not present

## 2023-08-31 DIAGNOSIS — Z87440 Personal history of urinary (tract) infections: Secondary | ICD-10-CM | POA: Diagnosis not present

## 2023-08-31 DIAGNOSIS — D631 Anemia in chronic kidney disease: Secondary | ICD-10-CM | POA: Diagnosis not present

## 2023-09-03 DIAGNOSIS — N1832 Chronic kidney disease, stage 3b: Secondary | ICD-10-CM | POA: Diagnosis not present

## 2023-09-03 DIAGNOSIS — I129 Hypertensive chronic kidney disease with stage 1 through stage 4 chronic kidney disease, or unspecified chronic kidney disease: Secondary | ICD-10-CM | POA: Diagnosis not present

## 2023-09-03 DIAGNOSIS — Z5982 Transportation insecurity: Secondary | ICD-10-CM | POA: Diagnosis not present

## 2023-09-03 DIAGNOSIS — F419 Anxiety disorder, unspecified: Secondary | ICD-10-CM | POA: Diagnosis not present

## 2023-09-03 DIAGNOSIS — Z7982 Long term (current) use of aspirin: Secondary | ICD-10-CM | POA: Diagnosis not present

## 2023-09-03 DIAGNOSIS — E7800 Pure hypercholesterolemia, unspecified: Secondary | ICD-10-CM | POA: Diagnosis not present

## 2023-09-03 DIAGNOSIS — K219 Gastro-esophageal reflux disease without esophagitis: Secondary | ICD-10-CM | POA: Diagnosis not present

## 2023-09-03 DIAGNOSIS — Z85528 Personal history of other malignant neoplasm of kidney: Secondary | ICD-10-CM | POA: Diagnosis not present

## 2023-09-03 DIAGNOSIS — L299 Pruritus, unspecified: Secondary | ICD-10-CM | POA: Diagnosis not present

## 2023-09-03 DIAGNOSIS — Z9181 History of falling: Secondary | ICD-10-CM | POA: Diagnosis not present

## 2023-09-03 DIAGNOSIS — F329 Major depressive disorder, single episode, unspecified: Secondary | ICD-10-CM | POA: Diagnosis not present

## 2023-09-03 DIAGNOSIS — Z87891 Personal history of nicotine dependence: Secondary | ICD-10-CM | POA: Diagnosis not present

## 2023-09-03 DIAGNOSIS — Z8781 Personal history of (healed) traumatic fracture: Secondary | ICD-10-CM | POA: Diagnosis not present

## 2023-09-03 DIAGNOSIS — Z8542 Personal history of malignant neoplasm of other parts of uterus: Secondary | ICD-10-CM | POA: Diagnosis not present

## 2023-09-03 DIAGNOSIS — Z90710 Acquired absence of both cervix and uterus: Secondary | ICD-10-CM | POA: Diagnosis not present

## 2023-09-03 DIAGNOSIS — R350 Frequency of micturition: Secondary | ICD-10-CM | POA: Diagnosis not present

## 2023-09-03 DIAGNOSIS — Z8673 Personal history of transient ischemic attack (TIA), and cerebral infarction without residual deficits: Secondary | ICD-10-CM | POA: Diagnosis not present

## 2023-09-03 DIAGNOSIS — K5900 Constipation, unspecified: Secondary | ICD-10-CM | POA: Diagnosis not present

## 2023-09-03 DIAGNOSIS — I2510 Atherosclerotic heart disease of native coronary artery without angina pectoris: Secondary | ICD-10-CM | POA: Diagnosis not present

## 2023-09-03 DIAGNOSIS — Z87440 Personal history of urinary (tract) infections: Secondary | ICD-10-CM | POA: Diagnosis not present

## 2023-09-03 DIAGNOSIS — Z556 Problems related to health literacy: Secondary | ICD-10-CM | POA: Diagnosis not present

## 2023-09-03 DIAGNOSIS — D631 Anemia in chronic kidney disease: Secondary | ICD-10-CM | POA: Diagnosis not present

## 2023-09-06 DIAGNOSIS — K5900 Constipation, unspecified: Secondary | ICD-10-CM | POA: Diagnosis not present

## 2023-09-06 DIAGNOSIS — L299 Pruritus, unspecified: Secondary | ICD-10-CM | POA: Diagnosis not present

## 2023-09-06 DIAGNOSIS — F329 Major depressive disorder, single episode, unspecified: Secondary | ICD-10-CM | POA: Diagnosis not present

## 2023-09-06 DIAGNOSIS — Z90710 Acquired absence of both cervix and uterus: Secondary | ICD-10-CM | POA: Diagnosis not present

## 2023-09-06 DIAGNOSIS — Z5982 Transportation insecurity: Secondary | ICD-10-CM | POA: Diagnosis not present

## 2023-09-06 DIAGNOSIS — D631 Anemia in chronic kidney disease: Secondary | ICD-10-CM | POA: Diagnosis not present

## 2023-09-06 DIAGNOSIS — Z8542 Personal history of malignant neoplasm of other parts of uterus: Secondary | ICD-10-CM | POA: Diagnosis not present

## 2023-09-06 DIAGNOSIS — K219 Gastro-esophageal reflux disease without esophagitis: Secondary | ICD-10-CM | POA: Diagnosis not present

## 2023-09-06 DIAGNOSIS — R32 Unspecified urinary incontinence: Secondary | ICD-10-CM | POA: Diagnosis not present

## 2023-09-06 DIAGNOSIS — Z556 Problems related to health literacy: Secondary | ICD-10-CM | POA: Diagnosis not present

## 2023-09-06 DIAGNOSIS — I2510 Atherosclerotic heart disease of native coronary artery without angina pectoris: Secondary | ICD-10-CM | POA: Diagnosis not present

## 2023-09-06 DIAGNOSIS — Z7982 Long term (current) use of aspirin: Secondary | ICD-10-CM | POA: Diagnosis not present

## 2023-09-06 DIAGNOSIS — Z87891 Personal history of nicotine dependence: Secondary | ICD-10-CM | POA: Diagnosis not present

## 2023-09-06 DIAGNOSIS — Z8673 Personal history of transient ischemic attack (TIA), and cerebral infarction without residual deficits: Secondary | ICD-10-CM | POA: Diagnosis not present

## 2023-09-06 DIAGNOSIS — I10 Essential (primary) hypertension: Secondary | ICD-10-CM | POA: Diagnosis not present

## 2023-09-06 DIAGNOSIS — F419 Anxiety disorder, unspecified: Secondary | ICD-10-CM | POA: Diagnosis not present

## 2023-09-06 DIAGNOSIS — Z87440 Personal history of urinary (tract) infections: Secondary | ICD-10-CM | POA: Diagnosis not present

## 2023-09-06 DIAGNOSIS — Z8781 Personal history of (healed) traumatic fracture: Secondary | ICD-10-CM | POA: Diagnosis not present

## 2023-09-06 DIAGNOSIS — R350 Frequency of micturition: Secondary | ICD-10-CM | POA: Diagnosis not present

## 2023-09-06 DIAGNOSIS — I129 Hypertensive chronic kidney disease with stage 1 through stage 4 chronic kidney disease, or unspecified chronic kidney disease: Secondary | ICD-10-CM | POA: Diagnosis not present

## 2023-09-06 DIAGNOSIS — Z85528 Personal history of other malignant neoplasm of kidney: Secondary | ICD-10-CM | POA: Diagnosis not present

## 2023-09-06 DIAGNOSIS — Z9181 History of falling: Secondary | ICD-10-CM | POA: Diagnosis not present

## 2023-09-06 DIAGNOSIS — N1832 Chronic kidney disease, stage 3b: Secondary | ICD-10-CM | POA: Diagnosis not present

## 2023-09-06 DIAGNOSIS — E7800 Pure hypercholesterolemia, unspecified: Secondary | ICD-10-CM | POA: Diagnosis not present

## 2023-09-07 DIAGNOSIS — I10 Essential (primary) hypertension: Secondary | ICD-10-CM | POA: Diagnosis not present

## 2023-09-07 DIAGNOSIS — M6281 Muscle weakness (generalized): Secondary | ICD-10-CM | POA: Diagnosis not present

## 2023-09-07 DIAGNOSIS — S82112A Displaced fracture of left tibial spine, initial encounter for closed fracture: Secondary | ICD-10-CM | POA: Diagnosis not present

## 2023-09-07 DIAGNOSIS — Z9181 History of falling: Secondary | ICD-10-CM | POA: Diagnosis not present

## 2023-09-08 DIAGNOSIS — R350 Frequency of micturition: Secondary | ICD-10-CM | POA: Diagnosis not present

## 2023-09-08 DIAGNOSIS — F329 Major depressive disorder, single episode, unspecified: Secondary | ICD-10-CM | POA: Diagnosis not present

## 2023-09-08 DIAGNOSIS — Z8542 Personal history of malignant neoplasm of other parts of uterus: Secondary | ICD-10-CM | POA: Diagnosis not present

## 2023-09-08 DIAGNOSIS — Z87891 Personal history of nicotine dependence: Secondary | ICD-10-CM | POA: Diagnosis not present

## 2023-09-08 DIAGNOSIS — Z85528 Personal history of other malignant neoplasm of kidney: Secondary | ICD-10-CM | POA: Diagnosis not present

## 2023-09-08 DIAGNOSIS — E7800 Pure hypercholesterolemia, unspecified: Secondary | ICD-10-CM | POA: Diagnosis not present

## 2023-09-08 DIAGNOSIS — N1832 Chronic kidney disease, stage 3b: Secondary | ICD-10-CM | POA: Diagnosis not present

## 2023-09-08 DIAGNOSIS — D631 Anemia in chronic kidney disease: Secondary | ICD-10-CM | POA: Diagnosis not present

## 2023-09-08 DIAGNOSIS — Z8673 Personal history of transient ischemic attack (TIA), and cerebral infarction without residual deficits: Secondary | ICD-10-CM | POA: Diagnosis not present

## 2023-09-08 DIAGNOSIS — I129 Hypertensive chronic kidney disease with stage 1 through stage 4 chronic kidney disease, or unspecified chronic kidney disease: Secondary | ICD-10-CM | POA: Diagnosis not present

## 2023-09-08 DIAGNOSIS — Z7982 Long term (current) use of aspirin: Secondary | ICD-10-CM | POA: Diagnosis not present

## 2023-09-08 DIAGNOSIS — K5900 Constipation, unspecified: Secondary | ICD-10-CM | POA: Diagnosis not present

## 2023-09-08 DIAGNOSIS — Z9181 History of falling: Secondary | ICD-10-CM | POA: Diagnosis not present

## 2023-09-08 DIAGNOSIS — Z87440 Personal history of urinary (tract) infections: Secondary | ICD-10-CM | POA: Diagnosis not present

## 2023-09-08 DIAGNOSIS — Z8781 Personal history of (healed) traumatic fracture: Secondary | ICD-10-CM | POA: Diagnosis not present

## 2023-09-08 DIAGNOSIS — Z5982 Transportation insecurity: Secondary | ICD-10-CM | POA: Diagnosis not present

## 2023-09-08 DIAGNOSIS — F419 Anxiety disorder, unspecified: Secondary | ICD-10-CM | POA: Diagnosis not present

## 2023-09-08 DIAGNOSIS — Z90710 Acquired absence of both cervix and uterus: Secondary | ICD-10-CM | POA: Diagnosis not present

## 2023-09-08 DIAGNOSIS — K219 Gastro-esophageal reflux disease without esophagitis: Secondary | ICD-10-CM | POA: Diagnosis not present

## 2023-09-08 DIAGNOSIS — L299 Pruritus, unspecified: Secondary | ICD-10-CM | POA: Diagnosis not present

## 2023-09-08 DIAGNOSIS — Z556 Problems related to health literacy: Secondary | ICD-10-CM | POA: Diagnosis not present

## 2023-09-08 DIAGNOSIS — I2510 Atherosclerotic heart disease of native coronary artery without angina pectoris: Secondary | ICD-10-CM | POA: Diagnosis not present

## 2023-09-15 DIAGNOSIS — Z8673 Personal history of transient ischemic attack (TIA), and cerebral infarction without residual deficits: Secondary | ICD-10-CM | POA: Diagnosis not present

## 2023-09-15 DIAGNOSIS — I129 Hypertensive chronic kidney disease with stage 1 through stage 4 chronic kidney disease, or unspecified chronic kidney disease: Secondary | ICD-10-CM | POA: Diagnosis not present

## 2023-09-15 DIAGNOSIS — Z5982 Transportation insecurity: Secondary | ICD-10-CM | POA: Diagnosis not present

## 2023-09-15 DIAGNOSIS — F329 Major depressive disorder, single episode, unspecified: Secondary | ICD-10-CM | POA: Diagnosis not present

## 2023-09-15 DIAGNOSIS — N1832 Chronic kidney disease, stage 3b: Secondary | ICD-10-CM | POA: Diagnosis not present

## 2023-09-15 DIAGNOSIS — K219 Gastro-esophageal reflux disease without esophagitis: Secondary | ICD-10-CM | POA: Diagnosis not present

## 2023-09-15 DIAGNOSIS — D631 Anemia in chronic kidney disease: Secondary | ICD-10-CM | POA: Diagnosis not present

## 2023-09-15 DIAGNOSIS — F419 Anxiety disorder, unspecified: Secondary | ICD-10-CM | POA: Diagnosis not present

## 2023-09-15 DIAGNOSIS — Z556 Problems related to health literacy: Secondary | ICD-10-CM | POA: Diagnosis not present

## 2023-09-15 DIAGNOSIS — Z7982 Long term (current) use of aspirin: Secondary | ICD-10-CM | POA: Diagnosis not present

## 2023-09-15 DIAGNOSIS — Z8542 Personal history of malignant neoplasm of other parts of uterus: Secondary | ICD-10-CM | POA: Diagnosis not present

## 2023-09-15 DIAGNOSIS — Z87440 Personal history of urinary (tract) infections: Secondary | ICD-10-CM | POA: Diagnosis not present

## 2023-09-15 DIAGNOSIS — E7800 Pure hypercholesterolemia, unspecified: Secondary | ICD-10-CM | POA: Diagnosis not present

## 2023-09-15 DIAGNOSIS — Z90710 Acquired absence of both cervix and uterus: Secondary | ICD-10-CM | POA: Diagnosis not present

## 2023-09-15 DIAGNOSIS — R350 Frequency of micturition: Secondary | ICD-10-CM | POA: Diagnosis not present

## 2023-09-15 DIAGNOSIS — Z85528 Personal history of other malignant neoplasm of kidney: Secondary | ICD-10-CM | POA: Diagnosis not present

## 2023-09-15 DIAGNOSIS — I2510 Atherosclerotic heart disease of native coronary artery without angina pectoris: Secondary | ICD-10-CM | POA: Diagnosis not present

## 2023-09-15 DIAGNOSIS — Z8781 Personal history of (healed) traumatic fracture: Secondary | ICD-10-CM | POA: Diagnosis not present

## 2023-09-15 DIAGNOSIS — K5900 Constipation, unspecified: Secondary | ICD-10-CM | POA: Diagnosis not present

## 2023-09-15 DIAGNOSIS — L299 Pruritus, unspecified: Secondary | ICD-10-CM | POA: Diagnosis not present

## 2023-09-15 DIAGNOSIS — Z9181 History of falling: Secondary | ICD-10-CM | POA: Diagnosis not present

## 2023-09-15 DIAGNOSIS — Z87891 Personal history of nicotine dependence: Secondary | ICD-10-CM | POA: Diagnosis not present

## 2023-09-16 DIAGNOSIS — I2510 Atherosclerotic heart disease of native coronary artery without angina pectoris: Secondary | ICD-10-CM | POA: Diagnosis not present

## 2023-09-16 DIAGNOSIS — Z8542 Personal history of malignant neoplasm of other parts of uterus: Secondary | ICD-10-CM | POA: Diagnosis not present

## 2023-09-16 DIAGNOSIS — Z87440 Personal history of urinary (tract) infections: Secondary | ICD-10-CM | POA: Diagnosis not present

## 2023-09-16 DIAGNOSIS — D631 Anemia in chronic kidney disease: Secondary | ICD-10-CM | POA: Diagnosis not present

## 2023-09-16 DIAGNOSIS — R350 Frequency of micturition: Secondary | ICD-10-CM | POA: Diagnosis not present

## 2023-09-16 DIAGNOSIS — F419 Anxiety disorder, unspecified: Secondary | ICD-10-CM | POA: Diagnosis not present

## 2023-09-16 DIAGNOSIS — Z5982 Transportation insecurity: Secondary | ICD-10-CM | POA: Diagnosis not present

## 2023-09-16 DIAGNOSIS — N1832 Chronic kidney disease, stage 3b: Secondary | ICD-10-CM | POA: Diagnosis not present

## 2023-09-16 DIAGNOSIS — F329 Major depressive disorder, single episode, unspecified: Secondary | ICD-10-CM | POA: Diagnosis not present

## 2023-09-16 DIAGNOSIS — K219 Gastro-esophageal reflux disease without esophagitis: Secondary | ICD-10-CM | POA: Diagnosis not present

## 2023-09-16 DIAGNOSIS — Z8673 Personal history of transient ischemic attack (TIA), and cerebral infarction without residual deficits: Secondary | ICD-10-CM | POA: Diagnosis not present

## 2023-09-16 DIAGNOSIS — Z556 Problems related to health literacy: Secondary | ICD-10-CM | POA: Diagnosis not present

## 2023-09-16 DIAGNOSIS — I129 Hypertensive chronic kidney disease with stage 1 through stage 4 chronic kidney disease, or unspecified chronic kidney disease: Secondary | ICD-10-CM | POA: Diagnosis not present

## 2023-09-16 DIAGNOSIS — Z85528 Personal history of other malignant neoplasm of kidney: Secondary | ICD-10-CM | POA: Diagnosis not present

## 2023-09-16 DIAGNOSIS — E7800 Pure hypercholesterolemia, unspecified: Secondary | ICD-10-CM | POA: Diagnosis not present

## 2023-09-16 DIAGNOSIS — Z87891 Personal history of nicotine dependence: Secondary | ICD-10-CM | POA: Diagnosis not present

## 2023-09-16 DIAGNOSIS — Z9181 History of falling: Secondary | ICD-10-CM | POA: Diagnosis not present

## 2023-09-16 DIAGNOSIS — Z8781 Personal history of (healed) traumatic fracture: Secondary | ICD-10-CM | POA: Diagnosis not present

## 2023-09-16 DIAGNOSIS — L299 Pruritus, unspecified: Secondary | ICD-10-CM | POA: Diagnosis not present

## 2023-09-16 DIAGNOSIS — Z7982 Long term (current) use of aspirin: Secondary | ICD-10-CM | POA: Diagnosis not present

## 2023-09-16 DIAGNOSIS — Z90710 Acquired absence of both cervix and uterus: Secondary | ICD-10-CM | POA: Diagnosis not present

## 2023-09-16 DIAGNOSIS — K5900 Constipation, unspecified: Secondary | ICD-10-CM | POA: Diagnosis not present

## 2023-09-22 DIAGNOSIS — I10 Essential (primary) hypertension: Secondary | ICD-10-CM | POA: Diagnosis not present

## 2023-09-23 DIAGNOSIS — D631 Anemia in chronic kidney disease: Secondary | ICD-10-CM | POA: Diagnosis not present

## 2023-09-23 DIAGNOSIS — Z7982 Long term (current) use of aspirin: Secondary | ICD-10-CM | POA: Diagnosis not present

## 2023-09-23 DIAGNOSIS — R350 Frequency of micturition: Secondary | ICD-10-CM | POA: Diagnosis not present

## 2023-09-23 DIAGNOSIS — Z8542 Personal history of malignant neoplasm of other parts of uterus: Secondary | ICD-10-CM | POA: Diagnosis not present

## 2023-09-23 DIAGNOSIS — Z5982 Transportation insecurity: Secondary | ICD-10-CM | POA: Diagnosis not present

## 2023-09-23 DIAGNOSIS — Z87440 Personal history of urinary (tract) infections: Secondary | ICD-10-CM | POA: Diagnosis not present

## 2023-09-23 DIAGNOSIS — L299 Pruritus, unspecified: Secondary | ICD-10-CM | POA: Diagnosis not present

## 2023-09-23 DIAGNOSIS — K219 Gastro-esophageal reflux disease without esophagitis: Secondary | ICD-10-CM | POA: Diagnosis not present

## 2023-09-23 DIAGNOSIS — I129 Hypertensive chronic kidney disease with stage 1 through stage 4 chronic kidney disease, or unspecified chronic kidney disease: Secondary | ICD-10-CM | POA: Diagnosis not present

## 2023-09-23 DIAGNOSIS — I2510 Atherosclerotic heart disease of native coronary artery without angina pectoris: Secondary | ICD-10-CM | POA: Diagnosis not present

## 2023-09-23 DIAGNOSIS — Z9181 History of falling: Secondary | ICD-10-CM | POA: Diagnosis not present

## 2023-09-23 DIAGNOSIS — F329 Major depressive disorder, single episode, unspecified: Secondary | ICD-10-CM | POA: Diagnosis not present

## 2023-09-23 DIAGNOSIS — Z85528 Personal history of other malignant neoplasm of kidney: Secondary | ICD-10-CM | POA: Diagnosis not present

## 2023-09-23 DIAGNOSIS — Z8781 Personal history of (healed) traumatic fracture: Secondary | ICD-10-CM | POA: Diagnosis not present

## 2023-09-23 DIAGNOSIS — K5900 Constipation, unspecified: Secondary | ICD-10-CM | POA: Diagnosis not present

## 2023-09-23 DIAGNOSIS — Z8673 Personal history of transient ischemic attack (TIA), and cerebral infarction without residual deficits: Secondary | ICD-10-CM | POA: Diagnosis not present

## 2023-09-23 DIAGNOSIS — F419 Anxiety disorder, unspecified: Secondary | ICD-10-CM | POA: Diagnosis not present

## 2023-09-23 DIAGNOSIS — Z556 Problems related to health literacy: Secondary | ICD-10-CM | POA: Diagnosis not present

## 2023-09-23 DIAGNOSIS — Z87891 Personal history of nicotine dependence: Secondary | ICD-10-CM | POA: Diagnosis not present

## 2023-09-23 DIAGNOSIS — N1832 Chronic kidney disease, stage 3b: Secondary | ICD-10-CM | POA: Diagnosis not present

## 2023-09-23 DIAGNOSIS — Z90710 Acquired absence of both cervix and uterus: Secondary | ICD-10-CM | POA: Diagnosis not present

## 2023-09-23 DIAGNOSIS — E7800 Pure hypercholesterolemia, unspecified: Secondary | ICD-10-CM | POA: Diagnosis not present

## 2023-09-24 DIAGNOSIS — I129 Hypertensive chronic kidney disease with stage 1 through stage 4 chronic kidney disease, or unspecified chronic kidney disease: Secondary | ICD-10-CM | POA: Diagnosis not present

## 2023-09-24 DIAGNOSIS — F329 Major depressive disorder, single episode, unspecified: Secondary | ICD-10-CM | POA: Diagnosis not present

## 2023-09-24 DIAGNOSIS — Z8781 Personal history of (healed) traumatic fracture: Secondary | ICD-10-CM | POA: Diagnosis not present

## 2023-09-24 DIAGNOSIS — Z87440 Personal history of urinary (tract) infections: Secondary | ICD-10-CM | POA: Diagnosis not present

## 2023-09-24 DIAGNOSIS — Z7982 Long term (current) use of aspirin: Secondary | ICD-10-CM | POA: Diagnosis not present

## 2023-09-24 DIAGNOSIS — Z8542 Personal history of malignant neoplasm of other parts of uterus: Secondary | ICD-10-CM | POA: Diagnosis not present

## 2023-09-24 DIAGNOSIS — Z85528 Personal history of other malignant neoplasm of kidney: Secondary | ICD-10-CM | POA: Diagnosis not present

## 2023-09-24 DIAGNOSIS — Z87891 Personal history of nicotine dependence: Secondary | ICD-10-CM | POA: Diagnosis not present

## 2023-09-24 DIAGNOSIS — Z5982 Transportation insecurity: Secondary | ICD-10-CM | POA: Diagnosis not present

## 2023-09-24 DIAGNOSIS — K5900 Constipation, unspecified: Secondary | ICD-10-CM | POA: Diagnosis not present

## 2023-09-24 DIAGNOSIS — Z9181 History of falling: Secondary | ICD-10-CM | POA: Diagnosis not present

## 2023-09-24 DIAGNOSIS — D631 Anemia in chronic kidney disease: Secondary | ICD-10-CM | POA: Diagnosis not present

## 2023-09-24 DIAGNOSIS — L299 Pruritus, unspecified: Secondary | ICD-10-CM | POA: Diagnosis not present

## 2023-09-24 DIAGNOSIS — R350 Frequency of micturition: Secondary | ICD-10-CM | POA: Diagnosis not present

## 2023-09-24 DIAGNOSIS — Z90710 Acquired absence of both cervix and uterus: Secondary | ICD-10-CM | POA: Diagnosis not present

## 2023-09-24 DIAGNOSIS — K219 Gastro-esophageal reflux disease without esophagitis: Secondary | ICD-10-CM | POA: Diagnosis not present

## 2023-09-24 DIAGNOSIS — I2510 Atherosclerotic heart disease of native coronary artery without angina pectoris: Secondary | ICD-10-CM | POA: Diagnosis not present

## 2023-09-24 DIAGNOSIS — Z556 Problems related to health literacy: Secondary | ICD-10-CM | POA: Diagnosis not present

## 2023-09-24 DIAGNOSIS — Z8673 Personal history of transient ischemic attack (TIA), and cerebral infarction without residual deficits: Secondary | ICD-10-CM | POA: Diagnosis not present

## 2023-09-24 DIAGNOSIS — E7800 Pure hypercholesterolemia, unspecified: Secondary | ICD-10-CM | POA: Diagnosis not present

## 2023-09-24 DIAGNOSIS — N1832 Chronic kidney disease, stage 3b: Secondary | ICD-10-CM | POA: Diagnosis not present

## 2023-09-24 DIAGNOSIS — F419 Anxiety disorder, unspecified: Secondary | ICD-10-CM | POA: Diagnosis not present

## 2023-10-07 DIAGNOSIS — I10 Essential (primary) hypertension: Secondary | ICD-10-CM | POA: Diagnosis not present

## 2023-10-07 DIAGNOSIS — N2889 Other specified disorders of kidney and ureter: Secondary | ICD-10-CM | POA: Diagnosis not present

## 2023-10-07 DIAGNOSIS — N1832 Chronic kidney disease, stage 3b: Secondary | ICD-10-CM | POA: Diagnosis not present

## 2023-10-07 DIAGNOSIS — D631 Anemia in chronic kidney disease: Secondary | ICD-10-CM | POA: Diagnosis not present

## 2023-11-01 DEATH — deceased

## 2024-03-14 ENCOUNTER — Encounter: Payer: Medicare Other | Admitting: Dermatology

## 2024-06-28 ENCOUNTER — Ambulatory Visit: Admitting: Urology
